# Patient Record
Sex: Male | Born: 1935 | Race: White | Hispanic: No | Marital: Married | Smoking: Never smoker
Health system: Southern US, Community
[De-identification: ages and names within clinical notes are randomized; demographics above are authoritative.]

## PROBLEM LIST (undated history)

## (undated) ENCOUNTER — Emergency Department (HOSPITAL_COMMUNITY): Admission: EM | Payer: Self-pay | Source: Home / Self Care

## (undated) ENCOUNTER — Inpatient Hospital Stay: Admission: EM | Payer: Self-pay | Source: Home / Self Care

## (undated) DIAGNOSIS — C61 Malignant neoplasm of prostate: Secondary | ICD-10-CM

## (undated) DIAGNOSIS — I10 Essential (primary) hypertension: Secondary | ICD-10-CM

## (undated) DIAGNOSIS — I2510 Atherosclerotic heart disease of native coronary artery without angina pectoris: Secondary | ICD-10-CM

## (undated) DIAGNOSIS — E785 Hyperlipidemia, unspecified: Secondary | ICD-10-CM

## (undated) DIAGNOSIS — Z973 Presence of spectacles and contact lenses: Secondary | ICD-10-CM

## (undated) DIAGNOSIS — C801 Malignant (primary) neoplasm, unspecified: Secondary | ICD-10-CM

## (undated) DIAGNOSIS — K219 Gastro-esophageal reflux disease without esophagitis: Secondary | ICD-10-CM

## (undated) DIAGNOSIS — I219 Acute myocardial infarction, unspecified: Secondary | ICD-10-CM

## (undated) DIAGNOSIS — M1990 Unspecified osteoarthritis, unspecified site: Secondary | ICD-10-CM

## (undated) HISTORY — DX: Hyperlipidemia, unspecified: E78.5

## (undated) HISTORY — PX: CORONARY ANGIOPLASTY: SHX604

## (undated) HISTORY — PX: OTHER SURGICAL HISTORY: SHX169

## (undated) HISTORY — DX: Essential (primary) hypertension: I10

## (undated) HISTORY — DX: Atherosclerotic heart disease of native coronary artery without angina pectoris: I25.10

## (undated) HISTORY — DX: Malignant neoplasm of prostate: C61

---

## 1998-04-29 ENCOUNTER — Other Ambulatory Visit: Admission: RE | Admit: 1998-04-29 | Discharge: 1998-04-29 | Payer: Self-pay | Admitting: Urology

## 1999-01-13 ENCOUNTER — Emergency Department (HOSPITAL_COMMUNITY): Admission: EM | Admit: 1999-01-13 | Discharge: 1999-01-13 | Payer: Self-pay | Admitting: Emergency Medicine

## 1999-01-13 ENCOUNTER — Encounter: Payer: Self-pay | Admitting: Emergency Medicine

## 2000-07-13 ENCOUNTER — Emergency Department (HOSPITAL_COMMUNITY): Admission: EM | Admit: 2000-07-13 | Discharge: 2000-07-13 | Payer: Self-pay | Admitting: Emergency Medicine

## 2001-02-28 ENCOUNTER — Ambulatory Visit (HOSPITAL_COMMUNITY): Admission: RE | Admit: 2001-02-28 | Discharge: 2001-02-28 | Payer: Self-pay | Admitting: Family Medicine

## 2001-02-28 ENCOUNTER — Encounter: Payer: Self-pay | Admitting: Family Medicine

## 2001-11-29 ENCOUNTER — Emergency Department (HOSPITAL_COMMUNITY): Admission: EM | Admit: 2001-11-29 | Discharge: 2001-11-29 | Payer: Self-pay | Admitting: Emergency Medicine

## 2001-11-29 ENCOUNTER — Encounter: Payer: Self-pay | Admitting: Emergency Medicine

## 2004-11-21 ENCOUNTER — Ambulatory Visit: Payer: Self-pay | Admitting: Internal Medicine

## 2004-11-21 ENCOUNTER — Emergency Department (HOSPITAL_COMMUNITY): Admission: EM | Admit: 2004-11-21 | Discharge: 2004-11-21 | Payer: Self-pay | Admitting: Emergency Medicine

## 2005-03-01 ENCOUNTER — Ambulatory Visit: Payer: Self-pay | Admitting: Family Medicine

## 2005-04-29 ENCOUNTER — Encounter: Admission: RE | Admit: 2005-04-29 | Discharge: 2005-04-29 | Payer: Self-pay | Admitting: Family Medicine

## 2005-04-29 ENCOUNTER — Ambulatory Visit: Payer: Self-pay | Admitting: Family Medicine

## 2007-01-25 ENCOUNTER — Ambulatory Visit: Payer: Self-pay | Admitting: Family Medicine

## 2007-04-18 ENCOUNTER — Ambulatory Visit: Payer: Self-pay | Admitting: Family Medicine

## 2007-04-18 LAB — CONVERTED CEMR LAB
ALT: 17 units/L (ref 0–40)
AST: 23 units/L (ref 0–37)
Albumin: 3.8 g/dL (ref 3.5–5.2)
Alkaline Phosphatase: 54 units/L (ref 39–117)
BUN: 17 mg/dL (ref 6–23)
Basophils Absolute: 0 10*3/uL (ref 0.0–0.1)
Basophils Relative: 0.1 % (ref 0.0–1.0)
Bilirubin, Direct: 0.1 mg/dL (ref 0.0–0.3)
CO2: 28 meq/L (ref 19–32)
Calcium: 9.1 mg/dL (ref 8.4–10.5)
Chloride: 105 meq/L (ref 96–112)
Cholesterol: 102 mg/dL (ref 0–200)
Creatinine, Ser: 0.9 mg/dL (ref 0.4–1.5)
Eosinophils Absolute: 0 10*3/uL (ref 0.0–0.6)
Eosinophils Relative: 1 % (ref 0.0–5.0)
GFR calc Af Amer: 106 mL/min
GFR calc non Af Amer: 87 mL/min
Glucose, Bld: 76 mg/dL (ref 70–99)
HCT: 40 % (ref 39.0–52.0)
HDL: 27 mg/dL — ABNORMAL LOW (ref >39.0)
Hemoglobin: 13.7 g/dL (ref 13.0–17.0)
LDL Cholesterol: 47 mg/dL (ref 0–99)
Lymphocytes Relative: 42.5 % (ref 12.0–46.0)
MCHC: 34.4 g/dL (ref 30.0–36.0)
MCV: 90.8 fL (ref 78.0–100.0)
Monocytes Absolute: 0.4 10*3/uL (ref 0.2–0.7)
Monocytes Relative: 10.1 % (ref 3.0–11.0)
Neutro Abs: 1.7 10*3/uL (ref 1.4–7.7)
Neutrophils Relative %: 46.3 % (ref 43.0–77.0)
PSA: 4.87 ng/mL — ABNORMAL HIGH (ref 0.10–4.00)
Platelets: 167 10*3/uL (ref 150–400)
Potassium: 3.8 meq/L (ref 3.5–5.1)
RBC: 4.4 M/uL (ref 4.22–5.81)
RDW: 12.9 % (ref 11.5–14.6)
Sodium: 141 meq/L (ref 135–145)
TSH: 0.79 microintl units/mL (ref 0.35–5.50)
Total Bilirubin: 1 mg/dL (ref 0.3–1.2)
Total CHOL/HDL Ratio: 3.8
Total Protein: 6.4 g/dL (ref 6.0–8.3)
Triglycerides: 138 mg/dL (ref 0–149)
VLDL: 28 mg/dL (ref 0–40)
WBC: 3.7 10*3/uL — ABNORMAL LOW (ref 4.5–10.5)

## 2007-05-02 ENCOUNTER — Ambulatory Visit: Payer: Self-pay | Admitting: Family Medicine

## 2007-07-14 ENCOUNTER — Telehealth: Payer: Self-pay | Admitting: Family Medicine

## 2007-09-04 DIAGNOSIS — Z8546 Personal history of malignant neoplasm of prostate: Secondary | ICD-10-CM | POA: Insufficient documentation

## 2007-09-04 DIAGNOSIS — I2510 Atherosclerotic heart disease of native coronary artery without angina pectoris: Secondary | ICD-10-CM | POA: Insufficient documentation

## 2007-09-04 DIAGNOSIS — I700 Atherosclerosis of aorta: Secondary | ICD-10-CM | POA: Insufficient documentation

## 2007-09-04 DIAGNOSIS — K219 Gastro-esophageal reflux disease without esophagitis: Secondary | ICD-10-CM

## 2008-05-23 ENCOUNTER — Ambulatory Visit: Payer: Self-pay | Admitting: Family Medicine

## 2008-05-23 DIAGNOSIS — I10 Essential (primary) hypertension: Secondary | ICD-10-CM

## 2008-05-23 DIAGNOSIS — E785 Hyperlipidemia, unspecified: Secondary | ICD-10-CM

## 2008-05-23 LAB — CONVERTED CEMR LAB
Bilirubin Urine: NEGATIVE
Blood in Urine, dipstick: NEGATIVE
Glucose, Urine, Semiquant: NEGATIVE
Nitrite: NEGATIVE
Protein, U semiquant: NEGATIVE
Specific Gravity, Urine: 1.025
Urobilinogen, UA: 0.2
WBC Urine, dipstick: NEGATIVE
pH: 6

## 2008-05-27 LAB — CONVERTED CEMR LAB
ALT: 23 units/L (ref 0–53)
AST: 32 units/L (ref 0–37)
Albumin: 4 g/dL (ref 3.5–5.2)
Alkaline Phosphatase: 72 units/L (ref 39–117)
BUN: 17 mg/dL (ref 6–23)
Basophils Absolute: 0 10*3/uL (ref 0.0–0.1)
Basophils Relative: 0.3 % (ref 0.0–3.0)
Bilirubin, Direct: 0.1 mg/dL (ref 0.0–0.3)
CO2: 31 meq/L (ref 19–32)
Calcium: 9.3 mg/dL (ref 8.4–10.5)
Chloride: 103 meq/L (ref 96–112)
Cholesterol: 135 mg/dL (ref 0–200)
Creatinine, Ser: 0.8 mg/dL (ref 0.4–1.5)
Eosinophils Absolute: 0.1 10*3/uL (ref 0.0–0.7)
Eosinophils Relative: 1.2 % (ref 0.0–5.0)
GFR calc Af Amer: 121 mL/min
GFR calc non Af Amer: 100 mL/min
Glucose, Bld: 101 mg/dL — ABNORMAL HIGH (ref 70–99)
HCT: 41.2 % (ref 39.0–52.0)
HDL: 34.8 mg/dL — ABNORMAL LOW (ref >39.0)
Hemoglobin: 14.1 g/dL (ref 13.0–17.0)
LDL Cholesterol: 72 mg/dL (ref 0–99)
Lymphocytes Relative: 42.7 % (ref 12.0–46.0)
MCHC: 34.3 g/dL (ref 30.0–36.0)
MCV: 92.3 fL (ref 78.0–100.0)
Monocytes Absolute: 0.5 10*3/uL (ref 0.1–1.0)
Monocytes Relative: 10.8 % (ref 3.0–12.0)
Neutro Abs: 1.9 10*3/uL (ref 1.4–7.7)
Neutrophils Relative %: 45 % (ref 43.0–77.0)
PSA: 0.16 ng/mL (ref 0.10–4.00)
Platelets: 191 10*3/uL (ref 150–400)
Potassium: 4.3 meq/L (ref 3.5–5.1)
RBC: 4.46 M/uL (ref 4.22–5.81)
RDW: 12.9 % (ref 11.5–14.6)
Sodium: 140 meq/L (ref 135–145)
TSH: 0.83 microintl units/mL (ref 0.35–5.50)
Total Bilirubin: 1 mg/dL (ref 0.3–1.2)
Total CHOL/HDL Ratio: 3.9
Total Protein: 6.5 g/dL (ref 6.0–8.3)
Triglycerides: 140 mg/dL (ref 0–149)
VLDL: 28 mg/dL (ref 0–40)
WBC: 4.3 10*3/uL — ABNORMAL LOW (ref 4.5–10.5)

## 2008-09-23 ENCOUNTER — Ambulatory Visit: Payer: Self-pay | Admitting: Family Medicine

## 2008-10-23 ENCOUNTER — Ambulatory Visit: Payer: Self-pay | Admitting: Internal Medicine

## 2008-10-23 DIAGNOSIS — R1013 Epigastric pain: Secondary | ICD-10-CM

## 2008-10-23 DIAGNOSIS — R1319 Other dysphagia: Secondary | ICD-10-CM

## 2008-10-23 DIAGNOSIS — R12 Heartburn: Secondary | ICD-10-CM

## 2009-04-28 ENCOUNTER — Ambulatory Visit: Payer: Self-pay | Admitting: Internal Medicine

## 2009-04-28 DIAGNOSIS — R3915 Urgency of urination: Secondary | ICD-10-CM

## 2009-04-28 LAB — CONVERTED CEMR LAB
Bilirubin Urine: NEGATIVE
Blood in Urine, dipstick: NEGATIVE
Glucose, Urine, Semiquant: NEGATIVE
Ketones, urine, test strip: NEGATIVE
Nitrite: NEGATIVE
Protein, U semiquant: 30
Specific Gravity, Urine: >=1.03
Urobilinogen, UA: 0.2
WBC Urine, dipstick: NEGATIVE
pH: 5

## 2010-01-19 ENCOUNTER — Ambulatory Visit: Payer: Self-pay | Admitting: Family Medicine

## 2010-01-19 LAB — CONVERTED CEMR LAB
ALT: 15 units/L (ref 0–53)
AST: 24 units/L (ref 0–37)
Albumin: 4 g/dL (ref 3.5–5.2)
Eosinophils Relative: 0.8 % (ref 0.0–5.0)
GFR calc non Af Amer: 76.83 mL/min (ref >60)
Glucose, Bld: 90 mg/dL (ref 70–99)
HCT: 40.1 % (ref 39.0–52.0)
HDL: 46.9 mg/dL (ref >39.00)
Hemoglobin: 13.5 g/dL (ref 13.0–17.0)
Lymphs Abs: 2 10*3/uL (ref 0.7–4.0)
Monocytes Relative: 9.8 % (ref 3.0–12.0)
Neutro Abs: 3.2 10*3/uL (ref 1.4–7.7)
Potassium: 4.8 meq/L (ref 3.5–5.1)
Sodium: 143 meq/L (ref 135–145)
TSH: 1.02 microintl units/mL (ref 0.35–5.50)
VLDL: 43.4 mg/dL — ABNORMAL HIGH (ref 0.0–40.0)
WBC: 5.8 10*3/uL (ref 4.5–10.5)

## 2010-03-09 ENCOUNTER — Ambulatory Visit: Payer: Self-pay | Admitting: Family Medicine

## 2010-12-08 NOTE — Assessment & Plan Note (Signed)
Summary: MED CK / REFILL // RS   Vital Signs:  Patient profile:   77 year old male Weight:      159 pounds Temp:     99.2 degrees F oral BP sitting:   110 / 70  (left arm) Cuff size:   regular  Vitals Entered By: Kern Reap CMA Duncan Dull) (January 19, 2010 3:26 PM)  Reason for Visit follow up meds    Primary Care Provider:  Kelle Darting, MD   History of Present Illness:  Randy [name] is a 77 year old, married male, nonsmoker, who comes in today for evaluation of multiple issues.  He has underlying hyperlipidemia, for which he takes Niaspan 500 mg daily and simvastatin 20 mg daily.  Will check lipid panel.  He has underlying coronary disease, asymptomatic.  No chest pain.  His hypertension, for which he takes a beta-blocker, 50 mg dose one half tablet b.i.d., BP 110/70.   12 months since he said a checkup.  His also on Toviaz from his urologist, Dr. Brunilda Payor......... however he doesn't feel, like it's working.  He would like a second opinion.  Encouraged him to discuss this with Dr. Brunilda Payor   Allergies (verified): No Known Drug Allergies  Past History:  Past medical, surgical, family and social histories (including risk factors) reviewed for relevance to current acute and chronic problems.  Past Medical History: Reviewed history from 05/23/2008 and no changes required. Prostate cancer, hx of GERD Myocardial infarction, hx of Hyperlipidemia Hypertension  Past Surgical History: Reviewed history from 10/23/2008 and no changes required. Flexible Sigmoidoscopy Prostatectomy PTCA-Stent  Family History: Reviewed history from 10/23/2008 and no changes required. Family History Diabetes 1st degree relative (mother) Family History of Prostate CA 1st degree relative <50 Family History of Cardiovascular disorder Bone cancer: Father  Social History: Reviewed history from 10/23/2008 and no changes required. 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 Has sons. Never  Smoked Alcohol use-no Drug use-no  Review of Systems      See HPI  Physical Exam  General:  Well-developed,well-nourished,in no acute distress; alert,appropriate and cooperative throughout examination   Impression & Recommendations:  Problem # 1:  HYPERTENSION (ICD-401.9) Assessment Improved  His updated medication list for this problem includes:    Metoprolol Tartrate 50 Mg Tabs (Metoprolol tartrate) .Marland Kitchen... 1/2 tablet by mouth two times a day  Orders: Venipuncture (16109) TLB-Lipid Panel (80061-LIPID) TLB-BMP (Basic Metabolic Panel-BMET) (80048-METABOL) TLB-CBC Platelet - w/Differential (85025-CBCD) TLB-Hepatic/Liver Function Pnl (80076-HEPATIC) TLB-TSH (Thyroid Stimulating Hormone) (60454-UJW) Prescription Created Electronically [phone])  Problem # 2:  HYPERLIPIDEMIA (ICD-272.4) Assessment: Improved  His updated medication list for this problem includes:    Niaspan 500 Mg Tbcr (Niacin (antihyperlipidemic)) .Marland Kitchen... Take 1 tablet by mouth at bedtime    Simvastatin 20 Mg Tabs (Simvastatin) .Marland Kitchen... Take on at bedtime  Orders: Venipuncture (78295) TLB-Lipid Panel (80061-LIPID) TLB-BMP (Basic Metabolic Panel-BMET) (80048-METABOL) TLB-CBC Platelet - w/Differential (85025-CBCD) TLB-Hepatic/Liver Function Pnl (80076-HEPATIC) TLB-TSH (Thyroid Stimulating Hormone) (62130-QMV) Prescription Created Electronically [id number])  Problem # 3:  MYOCARDIAL INFARCTION, HX OF (ICD-412) Assessment: Unchanged  His updated medication list for this problem includes:    Metoprolol Tartrate 50 Mg Tabs (Metoprolol tartrate) .Marland Kitchen... 1/2 tablet by mouth two times a day    Nitroglycerin 0.4 Mg Subl (Nitroglycerin) .Marland Kitchen... Place 1 tablet under tongue as directed    Bayer Aspirin 325 Mg Tabs (Aspirin)  Orders: Venipuncture (62952) TLB-Lipid Panel (80061-LIPID) TLB-BMP (Basic Metabolic Panel-BMET) (80048-METABOL) TLB-CBC Platelet - w/Differential (85025-CBCD) TLB-Hepatic/Liver Function Pnl  (80076-HEPATIC) TLB-TSH (Thyroid Stimulating  Hormone) (84443-TSH)  Complete Medication List: 1)  Metoprolol Tartrate 50 Mg Tabs (Metoprolol tartrate) .... 1/2 tablet by mouth two times a day 2)  Niaspan 500 Mg Tbcr (Niacin (antihyperlipidemic)) .... Take 1 tablet by mouth at bedtime 3)  Nitroglycerin 0.4 Mg Subl (Nitroglycerin) .... Place 1 tablet under tongue as directed 4)  Simvastatin 20 Mg Tabs (Simvastatin) .... Take on at bedtime 5)  Bayer Aspirin 325 Mg Tabs (Aspirin) 6)  Fish Oil Oil (Fish oil) .Marland Kitchen.. 1 capsule by mouth two times a day 7)  Calcium-vitamin D 500-125 Mg-unit Tabs (Calcium-vitamin d) .... Once daily  Patient Instructions: 1)  continue your medications. 2)  We will get u   Labwork today. 3)  So, the 30 minute appointment sometime in the next 4 to 6 weeks Prescriptions: SIMVASTATIN 20 MG TABS (SIMVASTATIN) take on at bedtime  #100 x 3   Entered and Authorized by:   Roderick Pee MD   Signed by:   Roderick Pee MD on 01/19/2010   Method used:   [facility] [phone]* (retail)       [address]       [place], [place]  [address]       Ph: [phone] or [phone]       Fax: [phone]   RxID:   [id number] NITROGLYCERIN 0.4 MG SUBL (NITROGLYCERIN) Place 1 tablet under tongue as directed  #25 x 1   Entered and Authorized by:   Roderick Pee MD   Signed by:   Roderick Pee MD on 01/19/2010   Method used:   [facility]        [facility] [phone]* (retail)       [address]       [place], [place]  [address]       Ph: [phone] or [phone]       Fax: [phone]   RxID:   [id number] NIASPAN 500 MG TBCR (NIACIN (ANTIHYPERLIPIDEMIC)) Take 1 tablet by mouth at bedtime  #100 x 3   Entered and Authorized by:   Roderick Pee MD   Signed by:   Roderick Pee MD on 01/19/2010   Method used:   [facility]        [facility]  [phone]* (retail)       [address]       [place], [place]  [id number]       Ph: [phone] or [phone]       Fax: [phone]   RxID:   [id number] METOPROLOL TARTRATE 50 MG TABS (METOPROLOL TARTRATE) 1/2 tablet by mouth two times a day  #100 x 3   Entered and Authorized by:   Roderick Pee MD   Signed by:   Roderick Pee MD on 01/19/2010   Method used:   [facility]        [facility] [phone]* (retail)       [address]       [place], [place]  [address]       Ph: [phone] or [phone]       Fax: [phone]   RxID:   [id number]

## 2010-12-08 NOTE — Assessment & Plan Note (Signed)
Summary: emp/cjr/pt rsc/cjr   Vital Signs:  Patient profile:   77 year old male Height:      67.25 inches Weight:      154 pounds BMI:     24.03 Temp:     98.1 degrees F oral BP sitting:   102 / 58  (left arm) Cuff size:   regular  Vitals Entered By: Kern Reap CMA Duncan Dull) (Mar 09, 2010 2:50 PM) CC: cpx   Primary Care Provider:  Kelle Darting, MD  CC:  cpx.  History of Present Illness: Randy [name] is a 77 year old male, who comes in today for general physical examination because of a history of underlying coronary disease, hyperlipidemia.  He takes Niaspan 500 mg daily, one aspirin daily, a beta-blocker one half tablet 50 mg b.i.d..  Coronary-wise, he is stable.  BP 102/58.  He takes simvastatin 20 mg nightly for hyperlipidemia.  Lipids are at goal with an LDL of 96.  He gets routine eye care.  Dental care.  Colonoscopy normal in GI, tetanus, 2004, Pneumovax 2000, declined, seasonal flu shot.  He taking toviaz from his urologist for urinary control.  He said a history of prostate cancer and has urinary leakage.  His past medical issues.  Social history, family history view in detail.  Physically, he continues to remain active.  Daily.  Mood is good, hearing normal, ADLs, normal, fall risk reviewed home safety reviewed.  Height, weight, vision, normal.  Allergies: No Known Drug Allergies  Past History:  Past medical, surgical, family and social histories (including risk factors) reviewed, and no changes noted (except as noted below).  Past Medical History: Reviewed history from 05/23/2008 and no changes required. Prostate cancer, hx of GERD Myocardial infarction, hx of Hyperlipidemia Hypertension  Past Surgical History: Reviewed history from 10/23/2008 and no changes required. Flexible Sigmoidoscopy Prostatectomy PTCA-Stent  Family History: Reviewed history from 10/23/2008 and no changes required. Family History Diabetes 1st degree relative (mother) Family History  of Prostate CA 1st degree relative <50 Family History of Cardiovascular disorder Bone cancer: Father  Social History: Reviewed history from 10/23/2008 and no changes required. 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 Has sons. Never Smoked Alcohol use-no Drug use-no  Review of Systems      See HPI  Physical Exam  General:  Well-developed,well-nourished,in no acute distress; alert,appropriate and cooperative throughout examination Head:  Normocephalic and atraumatic without obvious abnormalities. No apparent alopecia or balding. Eyes:  No corneal or conjunctival inflammation noted. EOMI. Perrla. Funduscopic exam benign, without hemorrhages, exudates or papilledema. Vision grossly normal. Ears:  External ear exam shows no significant lesions or deformities.  Otoscopic examination reveals clear canals, tympanic membranes are intact bilaterally without bulging, retraction, inflammation or discharge. Hearing is grossly normal bilaterally. Nose:  External nasal examination shows no deformity or inflammation. Nasal mucosa are pink and moist without lesions or exudates. Mouth:  Oral mucosa and oropharynx without lesions or exudates.  Teeth in good repair. Neck:  No deformities, masses, or tenderness noted. Chest Wall:  No deformities, masses, tenderness or gynecomastia noted. Breasts:  No masses or gynecomastia noted Lungs:  Normal respiratory effort, chest expands symmetrically. Lungs are clear to auscultation, no crackles or wheezes. Heart:  Normal rate and regular rhythm. S1 and S2 normal without gallop, murmur, click, rub or other extra sounds. Abdomen:  Bowel sounds positive,abdomen soft and non-tender without masses, organomegaly or hernias noted. Msk:  No deformity or scoliosis noted of thoracic or lumbar spine.   Pulses:  R and L carotid,radial,femoral,dorsalis pedis and posterior tibial pulses are full and equal bilaterally Extremities:  No clubbing, cyanosis, edema, or  deformity noted with normal full range of motion of all joints.   Neurologic:  No cranial nerve deficits noted. Station and gait are normal. Plantar reflexes are down-going bilaterally. DTRs are symmetrical throughout. Sensory, motor and coordinative functions appear intact. Skin:  Intact without suspicious lesions or rashes Cervical Nodes:  No lymphadenopathy noted Axillary Nodes:  No palpable lymphadenopathy Inguinal Nodes:  No significant adenopathy Psych:  Cognition and judgment appear intact. Alert and cooperative with normal attention span and concentration. No apparent delusions, illusions, hallucinations   Impression & Recommendations:  Problem # 1:  HYPERTENSION (ICD-401.9) Assessment Improved  His updated medication list for this problem includes:    Metoprolol Tartrate 50 Mg Tabs (Metoprolol tartrate) .Marland Kitchen... 1/2 tablet by mouth two times a day  Orders: Prescription Created Electronically [id number]) EKG w/ Interpretation (93000)  Problem # 2:  HYPERLIPIDEMIA (ICD-272.4) Assessment: Improved  His updated medication list for this problem includes:    Niaspan 500 Mg Tbcr (Niacin (antihyperlipidemic)) .Marland Kitchen... Take 1 tablet by mouth at bedtime    Simvastatin 20 Mg Tabs (Simvastatin) .Marland Kitchen... Take on at bedtime  Orders: Prescription Created Electronically [phone]) EKG w/ Interpretation (93000)  Problem # 3:  PREVENTIVE HEALTH CARE (ICD-V70.0) Assessment: Unchanged  Orders: Prescription Created Electronically [id number]) EKG w/ Interpretation (93000)  Complete Medication List: 1)  Metoprolol Tartrate 50 Mg Tabs (Metoprolol tartrate) .... 1/2 tablet by mouth two times a day 2)  Niaspan 500 Mg Tbcr (Niacin (antihyperlipidemic)) .... Take 1 tablet by mouth at bedtime 3)  Nitroglycerin 0.4 Mg Subl (Nitroglycerin) .... Place 1 tablet under tongue as directed 4)  Simvastatin 20 Mg Tabs (Simvastatin) .... Take on at bedtime 5)  Bayer Aspirin 325 Mg Tabs (Aspirin) 6)  Fish Oil Oil (Fish  oil) .Marland Kitchen.. 1 capsule by mouth two times a day 7)  Calcium-vitamin D 500-125 Mg-unit Tabs (Calcium-vitamin d) .... Once daily  Patient Instructions: 1)   continue current medications.  Follow-up in one year.  Remember to get the flu shot.  Next follow Prescriptions: SIMVASTATIN 20 MG TABS (SIMVASTATIN) take on at bedtime  #100 x 3   Entered and Authorized by:   Roderick Pee MD   Signed by:   Roderick Pee MD on 03/09/2010   Method used:   [facility]        [facility] [address] [phone]* (retail)       [address]       [place], [place]  [address]       Ph: [phone] or [phone]       Fax: [phone]   RxID:   [id number] NITROGLYCERIN 0.4 MG SUBL (NITROGLYCERIN) Place 1 tablet under tongue as directed  #25 x 1   Entered and Authorized by:   Roderick Pee MD   Signed by:   Roderick Pee MD on 03/09/2010   Method used:   [facility]ron[facility] to        [facility] [address] [phone]* (retail)       [address]       [place], [place]  [address]       Ph: [phone] or [phone]       Fax: [phone]   RxID:   [id number] NIASPAN 500 MG TBCR (NIACIN (ANTIHYPERLIPIDEMIC)) Take  1 tablet by mouth at bedtime  #100 x 3   Entered and Authorized by:   Roderick Pee MD   Signed by:   Roderick Pee MD on 03/09/2010   Method used:   Electronically to        [facility] [address] [phone]* (retail)       [address]       [place], [place]  [address]       Ph: [phone] or [phone]       Fax: [phone]   RxID:   [id number] METOPROLOL TARTRATE 50 MG TABS (METOPROLOL TARTRATE) 1/2 tablet by mouth two times a day  #100 Tablet x 3   Entered and Authorized by:   Roderick Pee MD   Signed by:   Roderick Pee MD on 03/09/2010   Method used:   [facility]ronically to        [facility] [address] [phone]* (retail)       [address]       [place], [place]  [address]       Ph: [phone] or [phone]       Fax: [phone]   RxID:   [id number]

## 2011-01-22 ENCOUNTER — Other Ambulatory Visit: Payer: Self-pay | Admitting: Family Medicine

## 2011-02-18 ENCOUNTER — Other Ambulatory Visit: Payer: Self-pay | Admitting: Family Medicine

## 2011-03-26 NOTE — Consult Note (Signed)
[name], [name] NO.:  [id number]   MEDICAL RECORD NO.:  [id number]          PATIENT TYPE:  EMS   LOCATION:  [facility]                         FACILITY:  [facility]   PHYSICIAN:  Rosalyn Gess. Norins, M.D. LHCDATE OF BIRTH:  09-Jun-1930   DATE OF CONSULTATION:  11/21/2004  DATE OF DISCHARGE:                                   CONSULTATION   TIME SEEN:  Consult performed at 0830 hours.   CHIEF COMPLAINT:  Cough with chest wall pain.   HISTORY OF PRESENT ILLNESS:  Randy [name], a 77 year old married white male  followed by Dr. Tawanna Cooler for primary care and Dr. Garnette Scheuermann for cardiology,  reports he had an episode of cough and malaise around Christmas but  recovered.  The patient reports starting on November 18, 2004 he developed a  nonproductive cough, no shortness of breath, no sore throat but a low-grade  fever.  His symptoms progressed with increased coughing, still  nonproductive.  He developed chest wall pain and discomfort.  He began to  run fevers of 101.  He started on Tylenol.  He continued to have fevers and  cough and therefore presented to the emergency department for evaluation.  Initial emergency department evaluation revealed the patient to have a  negative chest x-ray.  He did spike a fever to 102, did have an ongoing  cough in the emergency department and therefore I was asked to see and  evaluate the patient.   PAST MEDICAL HISTORY:  Surgical:  Back surgery in the 1960s, prostatectomy  in the 1990s.  Medical illnesses were childhood diseases, history of  hypertension and hyperlipidemia, coronary artery disease with an MI about 12  years ago status post PTCA, history of prostate cancer.   MEDICATIONS:  1.  Pravachol 20 mg a day.  2.  Metoprolol 25 mg b.i.d.  3.  Aspirin.  4.  Fish oil.   HABITS:  Tobacco:  None.  Alcohol:  None.   The patient has no known drug allergies.   FAMILY HISTORY:  Noncontributory.   SOCIAL HISTORY:  The patient spent 4 years  in the [employer] and served in  [place].  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 and worked for [employer]  where he continues to work.  He has been married 44 happy years.  He had 2  sons, 3 grandchildren.  He and his wife live together and they are  independent in their activities of daily living and as noted the patient  continues to work.   REVIEW OF SYSTEMS:  The patient has had no general constitutional symptoms  except the current illness.  He has had no cardiovascular complaints, no  respiratory complaints except for the current illness.  No rheumatologic  complaints.   PHYSICAL EXAMINATION:  VITAL SIGNS:  T-max was 102.3, temperature at my exam  was 101, blood pressure 111/57, heart rate was 94, respirations 16.  GENERAL APPEARANCE:  A well-nourished, well-developed Caucasian male.  He is  in no acute distress.  HEENT:  Normocephalic, atraumatic.  __________ with cerumen.  Oropharynx  with negative dentition, no buccal or palatal lesions were noted.  Posterior  pharynx was clear.  Conjunctivae and sclerae were clear.  Pupils equal,  round, reactive.  NECK:  Supple without thyromegaly.  LYMPHATICS:  No adenopathy was noted in the cervical or supraclavicular  regions.  CHEST:  No CVA tenderness.  Lungs were clear with no rales, no wheezes, no  increased work of breathing.  Chest wall was tender to palpation.  CARDIOVASCULAR:  2+ peripheral pulses, no JVD or carotid bruits.  He had a  quiet precordium with a regular rate and rhythm.  ABDOMEN:  Soft, no guarding or rebound.  No organosplenomegaly was noted.  GENITALIA:  Deferred.  EXTREMITIES:  Without clubbing, cyanosis, edema or deformity.  NEUROLOGICAL:  Nonfocal.   LABORATORY DATA:  Hemoglobin 12.7 g, white count 6000 with 86% segs, 6%  lymphs, 8% monos.  UA was negative.  Chemistries were unremarkable with a  creatinine of 1, SGOT minimally  elevated at 47.  Chest x-ray revealed normal  cardiac silhouette, normal bony architecture, no infiltrates or effusions.   ASSESSMENT:  Atypical bronchitis the patient with fever cough that is  nonproductive, weakness.  Suspect he has Mycoplasmal pneumoniae versus  Legionella versus Haemophilus influenzae.  The patient does not appear toxic  or unstable and therefore I believe can be discharged from the emergency  department for outpatient treatment.   PLAN:  1.  Discharge home with Biaxin XL 500 mg 2 tablets daily for 7 days,      Phenergan with codeine cough syrup.  2.  The patient is to continue all of his other medications.  3.  The patient is instructed to see Dr. Alonza Smoker, his primary physician,      late next week for follow up.  He is to see Dr. Katrinka Blazing as needed.       MEN/MEDQ  D:  11/21/2004  T:  11/21/2004  Job:  [id number]   cc:   Tinnie Gens A. Tawanna Cooler, M.D. [facility]   Lyn Records III, M.D.  [address]  [address]  [place]  [place] [address]  Fax: [phone]

## 2011-04-18 ENCOUNTER — Other Ambulatory Visit: Payer: Self-pay | Admitting: Family Medicine

## 2011-05-08 ENCOUNTER — Other Ambulatory Visit: Payer: Self-pay | Admitting: Family Medicine

## 2011-06-01 ENCOUNTER — Other Ambulatory Visit: Payer: Self-pay | Admitting: Family Medicine

## 2011-06-08 ENCOUNTER — Ambulatory Visit (HOSPITAL_BASED_OUTPATIENT_CLINIC_OR_DEPARTMENT_OTHER)
Admission: RE | Admit: 2011-06-08 | Discharge: 2011-06-08 | Disposition: A | Discharge status: Home / Self Care | Payer: Medicare Other | Source: Ambulatory Visit | Attending: Urology | Admitting: Urology

## 2011-06-08 DIAGNOSIS — N210 Calculus in bladder: Secondary | ICD-10-CM | POA: Insufficient documentation

## 2011-06-08 DIAGNOSIS — Z8546 Personal history of malignant neoplasm of prostate: Secondary | ICD-10-CM | POA: Insufficient documentation

## 2011-06-08 DIAGNOSIS — Z01812 Encounter for preprocedural laboratory examination: Secondary | ICD-10-CM | POA: Insufficient documentation

## 2011-06-08 DIAGNOSIS — N189 Chronic kidney disease, unspecified: Secondary | ICD-10-CM | POA: Insufficient documentation

## 2011-06-08 DIAGNOSIS — Z01810 Encounter for preprocedural cardiovascular examination: Secondary | ICD-10-CM | POA: Insufficient documentation

## 2011-06-08 DIAGNOSIS — K219 Gastro-esophageal reflux disease without esophagitis: Secondary | ICD-10-CM | POA: Insufficient documentation

## 2011-06-25 NOTE — Op Note (Signed)
  [name], [name] NO.:  [id number]  MEDICAL RECORD NO.:  [id number]  LOCATION:                                 FACILITY:  PHYSICIAN:  Danae Chen, M.D.  DATE OF BIRTH:  Oct 14, 1930  DATE OF PROCEDURE:  06/08/2011 DATE OF DISCHARGE:                              OPERATIVE REPORT   PREOPERATIVE DIAGNOSES: 1. Bladder calculus. 2. History of prostate cancer, status post radical prostatectomy in     1991.  POSTOPERATIVE DIAGNOSES: 1. Bladder calculus. 2. History of prostate cancer, status post radical prostatectomy in     1991.  PROCEDURE:  Cystoscopy, holmium laser of bladder stone.  SURGEON:  Danae Chen, M.D.  ANESTHESIA:  General.  INDICATIONS:  The patient is a 77 year old male who has been complaining of frequency, hesitancy, dysuria.  Cystoscopy showed a calculus in the bladder.  He is scheduled today for cystoscopy, holmium laser of bladder calculus.  DESCRIPTION OF PROCEDURE:  The patient was identified by his wristband and proper time-out was taken.  Under general anesthesia, he was prepped and draped and placed in the dorsal lithotomy position.  A panendoscope was inserted in the urethra, but could not be passed in the bladder because of bladder neck contracture.  The anterior urethra was normal.  The cystoscope was removed.  The urethra was then sequentially dilated up to #24 [place] and then the cystoscope was inserted in the bladder.  There was a stone in the bladder that measures about 5 cm.  There was no evidence of tumor in the bladder.  The ureteral orifices were in normal position and shape. With the holmium laser at a setting of 0.5 and 20 shocks, the stone was fragmented.  The stone fragments were removed with a nitinol basket. Several smaller stone fragments were then irrigated out of the bladder with the Toomey syringe. There was no evidence of remaining stone fragments in the bladder.  The cystoscope was then removed.  A #16  French Foley catheter was then inserted in the bladder.  The patient tolerated the procedure well and left the OR in satisfactory condition to postanesthesia care unit.     Danae Chen, M.D.     MN/MEDQ  D:  06/08/2011  T:  06/09/2011  Job:  [id number]  Electronically Signed by Lindaann Slough M.D. on 06/25/2011 07:41:02 AM

## 2011-08-04 ENCOUNTER — Other Ambulatory Visit: Payer: Self-pay | Admitting: Family Medicine

## 2011-09-01 ENCOUNTER — Other Ambulatory Visit: Payer: Self-pay | Admitting: Family Medicine

## 2011-12-19 ENCOUNTER — Other Ambulatory Visit: Payer: Self-pay | Admitting: Family Medicine

## 2012-02-17 ENCOUNTER — Other Ambulatory Visit: Payer: Self-pay | Admitting: Family Medicine

## 2012-04-06 ENCOUNTER — Other Ambulatory Visit: Payer: Self-pay | Admitting: Family Medicine

## 2012-05-10 ENCOUNTER — Other Ambulatory Visit: Payer: Self-pay | Admitting: *Deleted

## 2012-05-10 MED ORDER — METOPROLOL TARTRATE 50 MG PO TABS: ORAL_TABLET | ORAL | Status: DC

## 2012-07-20 ENCOUNTER — Other Ambulatory Visit: Payer: Self-pay | Admitting: Family Medicine

## 2012-08-07 ENCOUNTER — Encounter: Payer: Self-pay | Admitting: Family Medicine

## 2012-08-07 ENCOUNTER — Ambulatory Visit (INDEPENDENT_AMBULATORY_CARE_PROVIDER_SITE_OTHER): Payer: Medicare Other | Admitting: Family Medicine

## 2012-08-07 VITALS — BP 110/70 | Temp 97.5°F | Ht 68.5 in | Wt 160.0 lb

## 2012-08-07 DIAGNOSIS — Z8546 Personal history of malignant neoplasm of prostate: Secondary | ICD-10-CM

## 2012-08-07 DIAGNOSIS — E785 Hyperlipidemia, unspecified: Secondary | ICD-10-CM

## 2012-08-07 DIAGNOSIS — I10 Essential (primary) hypertension: Secondary | ICD-10-CM

## 2012-08-07 DIAGNOSIS — Z23 Encounter for immunization: Secondary | ICD-10-CM

## 2012-08-07 DIAGNOSIS — I252 Old myocardial infarction: Secondary | ICD-10-CM

## 2012-08-07 DIAGNOSIS — G4700 Insomnia, unspecified: Secondary | ICD-10-CM

## 2012-08-07 LAB — BASIC METABOLIC PANEL
CO2: 26 mEq/L (ref 19–32)
Calcium: 9.6 mg/dL (ref 8.4–10.5)
Chloride: 104 mEq/L (ref 96–112)
Creatinine, Ser: 1 mg/dL (ref 0.4–1.5)
Sodium: 140 mEq/L (ref 135–145)

## 2012-08-07 LAB — CBC WITH DIFFERENTIAL/PLATELET
Basophils Absolute: 0 10*3/uL (ref 0.0–0.1)
Eosinophils Relative: 0.8 % (ref 0.0–5.0)
Monocytes Relative: 9.5 % (ref 3.0–12.0)
Neutrophils Relative %: 52.9 % (ref 43.0–77.0)
Platelets: 184 10*3/uL (ref 150.0–400.0)
RDW: 14.2 % (ref 11.5–14.6)
WBC: 6.3 10*3/uL (ref 4.5–10.5)

## 2012-08-07 LAB — HEPATIC FUNCTION PANEL
ALT: 19 U/L (ref 0–53)
AST: 23 U/L (ref 0–37)
Albumin: 4.1 g/dL (ref 3.5–5.2)
Total Bilirubin: 0.6 mg/dL (ref 0.3–1.2)
Total Protein: 7 g/dL (ref 6.0–8.3)

## 2012-08-07 LAB — LIPID PANEL
Cholesterol: 209 mg/dL — ABNORMAL HIGH (ref 0–200)
HDL: 42.7 mg/dL (ref >39.00)
Triglycerides: 283 mg/dL — ABNORMAL HIGH (ref 0.0–149.0)

## 2012-08-07 MED ORDER — SIMVASTATIN 20 MG PO TABS: 40.0000 mg | ORAL_TABLET | Freq: Every day | ORAL | Status: DC

## 2012-08-07 MED ORDER — AMITRIPTYLINE HCL 25 MG PO TABS: ORAL_TABLET | ORAL | Status: DC

## 2012-08-07 MED ORDER — NIACIN ER (ANTIHYPERLIPIDEMIC) 1000 MG PO TBCR: 1000.0000 mg | EXTENDED_RELEASE_TABLET | Freq: Every day | ORAL | Status: DC

## 2012-08-07 MED ORDER — METOPROLOL TARTRATE 50 MG PO TABS: ORAL_TABLET | ORAL | Status: DC

## 2012-08-07 MED ORDER — SIMVASTATIN 20 MG PO TABS: 20.0000 mg | ORAL_TABLET | Freq: Every day | ORAL | Status: DC

## 2012-08-07 NOTE — Patient Instructions (Signed)
Continue your current medications  Begin Elavil 25 mg at 10 PM nightly  Labs today  Followup in 4 weeks

## 2012-08-07 NOTE — Progress Notes (Signed)
  Subjective:    Patient ID: Randy [name], male    DOB: 1930/03/15, 78 y.o.   MRN: [id number]  HPI Randy [name] is a 78 year old male who comes in after 4-5 year absence for followup and reevaluation  He's been seeing his cardiologist Dr. Katrinka [name] on a yearly basis because he said a heart attack. He's on Lopressor 25 mg daily BP 110/70 pulse 70 and regular simvastatin 20 mg daily and nitroglycerin when necessary  He states he saw Dr. Katrinka [name] a couple weeks ago and everything was normal.  He's not had a general medical exam in many years. He's also a history of prostate cancer and has been seen by urology  Review of systems negative except for sleep dysfunction. He says he goes to bed Kangas sleep he is worried but he can't throughout what he is worried about general and psychiatric review of systems otherwise negative    Review of Systems    general and psychiatric review of systems otherwise negative Objective:   Physical Exam Well-developed male no acute distress       Assessment & Plan:  Hypertension at goal continue current therapy  Hyperlipidemia goal continue current therapy  Coronary disease asymptomatic followed by Dr. Katrinka [name]  Sleep dysfunction

## 2012-08-08 LAB — PSA: PSA: 5.75 ng/mL — ABNORMAL HIGH (ref 0.10–4.00)

## 2012-08-08 LAB — TSH: TSH: 1.27 u[IU]/mL (ref 0.35–5.50)

## 2012-08-08 LAB — LDL CHOLESTEROL, DIRECT: Direct LDL: 116.9 mg/dL

## 2012-08-14 ENCOUNTER — Telehealth: Payer: Self-pay | Admitting: Family Medicine

## 2012-08-14 NOTE — Telephone Encounter (Signed)
Patient's prior auth for Amitriptyline has been denied. FDA does not cover for usage of treatment of insomnia. Please advise. Thank you.

## 2012-08-14 NOTE — Telephone Encounter (Signed)
100 tablets cause about $5

## 2012-08-15 NOTE — Telephone Encounter (Signed)
Called patient and informed him per Dr Tawanna Cooler amitriptyline 100 pills can cost $5.00

## 2012-09-19 ENCOUNTER — Other Ambulatory Visit (HOSPITAL_COMMUNITY): Payer: Self-pay | Admitting: Urology

## 2012-09-19 DIAGNOSIS — C61 Malignant neoplasm of prostate: Secondary | ICD-10-CM

## 2012-09-29 ENCOUNTER — Encounter (HOSPITAL_COMMUNITY): Payer: Medicare Other

## 2012-09-29 ENCOUNTER — Ambulatory Visit (HOSPITAL_COMMUNITY): Payer: Medicare Other

## 2012-10-03 ENCOUNTER — Encounter (HOSPITAL_COMMUNITY)
Admission: RE | Admit: 2012-10-03 | Discharge: 2012-10-03 | Disposition: A | Discharge status: Home / Self Care | Payer: Medicare Other | Source: Ambulatory Visit | Attending: Urology | Admitting: Urology

## 2012-10-03 DIAGNOSIS — C61 Malignant neoplasm of prostate: Secondary | ICD-10-CM

## 2012-10-03 MED ORDER — TECHNETIUM TC 99M MEDRONATE IV KIT
25.0000 | PACK | Freq: Once | INTRAVENOUS | Status: AC | PRN
  Administered 2012-10-03: 25 via INTRAVENOUS

## 2013-02-06 ENCOUNTER — Ambulatory Visit (INDEPENDENT_AMBULATORY_CARE_PROVIDER_SITE_OTHER): Payer: Medicare Other | Admitting: Family Medicine

## 2013-02-06 ENCOUNTER — Encounter: Payer: Self-pay | Admitting: Family Medicine

## 2013-02-06 VITALS — BP 140/80 | HR 65 | Temp 98.0°F

## 2013-02-06 DIAGNOSIS — G4700 Insomnia, unspecified: Secondary | ICD-10-CM

## 2013-02-06 DIAGNOSIS — I10 Essential (primary) hypertension: Secondary | ICD-10-CM

## 2013-02-06 MED ORDER — METOPROLOL TARTRATE 50 MG PO TABS: 50.0000 mg | ORAL_TABLET | Freq: Two times a day (BID) | ORAL | Status: DC

## 2013-02-06 MED ORDER — ZOLPIDEM TARTRATE 10 MG PO TABS: 10.0000 mg | ORAL_TABLET | Freq: Every evening | ORAL | Status: DC | PRN

## 2013-02-06 NOTE — Progress Notes (Signed)
  Subjective:    Patient ID: Randy [name], male    DOB: October 03, 1930, 79 y.o.   MRN: [id number]  HPI Here for high BP and insomnia. His BP has been running 140-180 over 70-90 for the past month. No HA or SOB or chest pain. He last saw Dr. Katrinka Blazing about 6 months ago. Also he has trouble sleeping every night, and he has tried some of his wife's Ambien. This works well and he wants some of his own.    Review of Systems  Constitutional: Negative.   Respiratory: Negative.   Cardiovascular: Negative.        Objective:   Physical Exam  Constitutional: He appears well-developed and well-nourished.  Neck: No thyromegaly present.  Cardiovascular: Normal rate, regular rhythm, normal heart sounds and intact distal pulses.   Pulmonary/Chest: Effort normal and breath sounds normal.  Lymphadenopathy:    He has no cervical adenopathy.          Assessment & Plan:  Increase the Metoprolol to a whole 50 mg tablet bid. Try Zolpidem. Follow up with Dr. Tawanna Cooler in 2-3 weeks

## 2013-02-14 ENCOUNTER — Other Ambulatory Visit: Payer: Self-pay | Admitting: Family Medicine

## 2013-02-19 ENCOUNTER — Ambulatory Visit: Payer: Medicare Other | Admitting: Family Medicine

## 2013-02-26 ENCOUNTER — Ambulatory Visit (INDEPENDENT_AMBULATORY_CARE_PROVIDER_SITE_OTHER): Payer: Medicare Other | Admitting: Family Medicine

## 2013-02-26 ENCOUNTER — Encounter: Payer: Self-pay | Admitting: Family Medicine

## 2013-02-26 VITALS — BP 128/70 | Temp 98.2°F | Wt 165.0 lb

## 2013-02-26 DIAGNOSIS — I10 Essential (primary) hypertension: Secondary | ICD-10-CM

## 2013-02-26 DIAGNOSIS — E785 Hyperlipidemia, unspecified: Secondary | ICD-10-CM

## 2013-02-26 MED ORDER — METOPROLOL TARTRATE 50 MG PO TABS: ORAL_TABLET | ORAL | Status: DC

## 2013-02-26 MED ORDER — SIMVASTATIN 20 MG PO TABS: 20.0000 mg | ORAL_TABLET | Freq: Every day | ORAL | Status: DC

## 2013-02-26 NOTE — Patient Instructions (Signed)
Lopressor 50 mg,,,,,,,,,,,, one tablet daily in the morning  Zocor 20 mg,,,,,,,,,,,, one tablet daily at bedtime

## 2013-02-26 NOTE — Progress Notes (Signed)
  Subjective:    Patient ID: Randy [name], male    DOB: [birth year], 79 y.o.   MRN: [id number]  HPI Randy [name] is an 79 year old male comes in today for evaluation of hypertension  He's blood pressure medication was increased to 50 mg twice a day Lopressor. BP today is 128/30 and is lightheaded when he stands up  He's on Zocor 20 mg daily for hyperlipidemia   Review of Systems Review of systems negative    Objective:   Physical Exam Well-developed and nourished male no acute distress BP 130/70 right arm sitting position pulse 60 and regular       Assessment & Plan:  Hypertension,,,,,,,,,, BP too low decrease Lopressor to 50 mg once daily  Hyperlipidemia Zocor 20 mg each bedtime

## 2013-03-20 ENCOUNTER — Encounter: Payer: Self-pay | Admitting: Family Medicine

## 2013-03-20 ENCOUNTER — Ambulatory Visit (INDEPENDENT_AMBULATORY_CARE_PROVIDER_SITE_OTHER): Payer: Medicare Other | Admitting: Family Medicine

## 2013-03-20 VITALS — BP 140/70 | HR 58 | Temp 97.9°F | Wt 163.0 lb

## 2013-03-20 DIAGNOSIS — F411 Generalized anxiety disorder: Secondary | ICD-10-CM

## 2013-03-20 DIAGNOSIS — I10 Essential (primary) hypertension: Secondary | ICD-10-CM

## 2013-03-20 NOTE — Progress Notes (Signed)
  Subjective:    Patient ID: KESHAUN DUBEY, male    DOB: 07-Oct-1930, 79 y.o.   MRN: [id number]  HPI  Elijah Birk is an 79 year old married male nonsmoker who comes in today for evaluation of 2 problems  He takes Lopressor 50 mg daily. He was concerned about his blood pressure slowly went to an EMS station. They checked his blood pressure which was normal and they also did a cardiogram.  He says he's having difficulty with emotional outbursts anxiety and business dealings with his son  Review of Systems    review of systems negative Objective:   Physical Exam  Well-developed well-nourished male no acute distress BP right arm sitting position 140/80 pulse 70 and regular      Assessment & Plan:  Hypertension at goal continue current therapy  Anxiety symptoms,,,,,,,,,,,,, consult with Judithe Modest

## 2013-03-20 NOTE — Patient Instructions (Signed)
Continue your blood pressure medication one daily  Since your blood pressure is normal check your blood pressure once weekly in the morning  Call and make arrangements to see Randy [name] for evaluation of the issues we discussed

## 2013-04-20 ENCOUNTER — Other Ambulatory Visit: Payer: Self-pay | Admitting: Family Medicine

## 2013-05-08 ENCOUNTER — Telehealth: Payer: Self-pay | Admitting: Family Medicine

## 2013-05-08 NOTE — Telephone Encounter (Signed)
Okay for 6 months 

## 2013-05-08 NOTE — Telephone Encounter (Signed)
Refill request for Zolpidem 10 mg take 1 po qhs prn

## 2013-05-09 MED ORDER — ZOLPIDEM TARTRATE 10 MG PO TABS: 10.0000 mg | ORAL_TABLET | Freq: Every evening | ORAL | Status: DC | PRN

## 2013-05-09 NOTE — Telephone Encounter (Signed)
I called in script 

## 2013-08-13 ENCOUNTER — Emergency Department (HOSPITAL_COMMUNITY): Payer: Medicare Other

## 2013-08-13 ENCOUNTER — Emergency Department (HOSPITAL_COMMUNITY)
Admission: EM | Admit: 2013-08-13 | Discharge: 2013-08-13 | Disposition: A | Discharge status: Home / Self Care | Payer: Medicare Other | Attending: Emergency Medicine | Admitting: Emergency Medicine

## 2013-08-13 ENCOUNTER — Encounter (HOSPITAL_COMMUNITY): Payer: Self-pay | Admitting: Emergency Medicine

## 2013-08-13 DIAGNOSIS — R51 Headache: Secondary | ICD-10-CM | POA: Insufficient documentation

## 2013-08-13 DIAGNOSIS — I2510 Atherosclerotic heart disease of native coronary artery without angina pectoris: Secondary | ICD-10-CM | POA: Insufficient documentation

## 2013-08-13 DIAGNOSIS — I498 Other specified cardiac arrhythmias: Secondary | ICD-10-CM | POA: Insufficient documentation

## 2013-08-13 DIAGNOSIS — Z79899 Other long term (current) drug therapy: Secondary | ICD-10-CM | POA: Insufficient documentation

## 2013-08-13 DIAGNOSIS — Z7982 Long term (current) use of aspirin: Secondary | ICD-10-CM | POA: Insufficient documentation

## 2013-08-13 DIAGNOSIS — E785 Hyperlipidemia, unspecified: Secondary | ICD-10-CM | POA: Insufficient documentation

## 2013-08-13 DIAGNOSIS — Z791 Long term (current) use of non-steroidal anti-inflammatories (NSAID): Secondary | ICD-10-CM | POA: Insufficient documentation

## 2013-08-13 DIAGNOSIS — Z859 Personal history of malignant neoplasm, unspecified: Secondary | ICD-10-CM | POA: Insufficient documentation

## 2013-08-13 DIAGNOSIS — I10 Essential (primary) hypertension: Secondary | ICD-10-CM | POA: Insufficient documentation

## 2013-08-13 HISTORY — DX: Atherosclerotic heart disease of native coronary artery without angina pectoris: I25.10

## 2013-08-13 HISTORY — DX: Malignant (primary) neoplasm, unspecified: C80.1

## 2013-08-13 LAB — COMPREHENSIVE METABOLIC PANEL
ALT: 15 U/L (ref 0–53)
AST: 25 U/L (ref 0–37)
Alkaline Phosphatase: 76 U/L (ref 39–117)
CO2: 26 mEq/L (ref 19–32)
Calcium: 9.8 mg/dL (ref 8.4–10.5)
Chloride: 103 mEq/L (ref 96–112)
GFR calc Af Amer: >90 mL/min (ref >90)
GFR calc non Af Amer: 78 mL/min — ABNORMAL LOW (ref >90)
Glucose, Bld: 94 mg/dL (ref 70–99)
Sodium: 141 mEq/L (ref 135–145)
Total Bilirubin: 0.6 mg/dL (ref 0.3–1.2)

## 2013-08-13 LAB — CBC WITH DIFFERENTIAL/PLATELET
Basophils Absolute: 0 10*3/uL (ref 0.0–0.1)
Eosinophils Relative: 1 % (ref 0–5)
HCT: 42.3 % (ref 39.0–52.0)
Lymphocytes Relative: 31 % (ref 12–46)
Lymphs Abs: 1.6 10*3/uL (ref 0.7–4.0)
MCV: 87 fL (ref 78.0–100.0)
Neutro Abs: 3.2 10*3/uL (ref 1.7–7.7)
Platelets: 163 10*3/uL (ref 150–400)
RBC: 4.86 MIL/uL (ref 4.22–5.81)
RDW: 13.3 % (ref 11.5–15.5)
WBC: 5.1 10*3/uL (ref 4.0–10.5)

## 2013-08-13 LAB — URINALYSIS, ROUTINE W REFLEX MICROSCOPIC
Bilirubin Urine: NEGATIVE
Hgb urine dipstick: NEGATIVE
Ketones, ur: NEGATIVE mg/dL
Nitrite: NEGATIVE
Protein, ur: NEGATIVE mg/dL
Urobilinogen, UA: 0.2 mg/dL (ref 0.0–1.0)

## 2013-08-13 MED ORDER — NAPROXEN 250 MG PO TABS
500.0000 mg | ORAL_TABLET | Freq: Once | ORAL | Status: AC
  Administered 2013-08-13: 500 mg via ORAL
  Filled 2013-08-13: qty 2

## 2013-08-13 MED ORDER — NAPROXEN 500 MG PO TABS: 500.0000 mg | ORAL_TABLET | Freq: Two times a day (BID) | ORAL | Status: DC

## 2013-08-13 NOTE — ED Notes (Signed)
Dr. Miller at the bedside.  

## 2013-08-13 NOTE — ED Provider Notes (Signed)
CSN: [id number]     Arrival date & time 08/13/13  1114 History   First MD Initiated Contact with Patient 08/13/13 1453     Chief Complaint  Patient presents with  . Headache  . 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  He has no hx of headaches in the past.  He reduced his lopressor from 50 bid to qdaily in April and has not had BP checked since - it is elevated today - went to EMS station and checked at 199/100's pta.  Patient is a 79 y.o. male presenting with headaches and hypertension. The history is provided by the patient.  Headache Hypertension Associated symptoms include headaches.    Past Medical History  Diagnosis Date  . Hyperlipidemia   . Hypertension   . Coronary artery disease   . Cancer    History reviewed. No pertinent past surgical history. History reviewed. No pertinent family history. History  Substance Use Topics  . Smoking status: Never Smoker   . Smokeless tobacco: Never Used  . Alcohol Use: No    Review of Systems  Neurological: Positive for headaches.  All other systems reviewed and are negative.    Allergies  Nsaids  Home Medications   Current Outpatient Rx  Name  Route  Sig  Dispense  Refill  . aspirin 325 MG EC tablet   Oral   Take 325 mg by mouth every morning.         . Calcium Carbonate-Vitamin D (CALCIUM + D PO)   Oral   Take 1 tablet by mouth every evening.         . fish oil-omega-3 fatty acids 1000 MG capsule   Oral   Take 2 g by mouth 2 (two) times daily.         . metoprolol (LOPRESSOR) 50 MG tablet   Oral   Take 50 mg by mouth  daily.         . niacin (NIASPAN) 500 MG CR tablet   Oral   Take 500 mg by mouth at bedtime.         . nitroGLYCERIN (NITROSTAT) 0.4 MG SL tablet   Sublingual   Place 0.4 mg under the tongue every 5 (five) minutes as needed for chest pain.         . simvastatin (ZOCOR) 20 MG tablet   Oral   Take 1 tablet (20 mg total) by mouth at bedtime.   100 tablet   3   . zolpidem (AMBIEN) 10 MG tablet   Oral   Take 5-10 mg by mouth at bedtime as needed for sleep.         . naproxen (NAPROSYN) 500 MG tablet   Oral   Take 1 tablet (500 mg total) by mouth 2 (two) times daily with a meal.   20 tablet   0    BP 122/70  Pulse 57  Temp(Src) 97.5 F (36.4 C) (Oral)  Resp 22  Ht 5\' 9"  (1.753 m)  Wt 157 lb (71.215 kg)  BMI 23.17 kg/m2  SpO2 100% Physical Exam  Nursing note and vitals reviewed. Constitutional: He appears well-developed and  well-nourished. No distress.  HENT:  Head: Normocephalic and atraumatic.  Mouth/Throat: Oropharynx is clear and moist. No oropharyngeal exudate.  Eyes: Conjunctivae and EOM are normal. Pupils are equal, round, and reactive to light. Right eye exhibits no discharge. Left eye exhibits no discharge. No scleral icterus.  Neck: Normal range of motion. Neck supple. No JVD present. No thyromegaly present.  Cardiovascular: Regular rhythm, normal heart sounds and intact distal pulses.  Exam reveals no gallop and no friction rub.   No murmur heard. Slight bradycardia.  Normal pulses, no JVD  Pulmonary/Chest: Effort normal and breath sounds normal. No respiratory distress. He has no wheezes. He has no rales.  Abdominal: Soft. Bowel sounds are normal. He exhibits no distension and no mass. There is no tenderness.  Musculoskeletal: Normal range of motion. He exhibits no edema and no tenderness.  No peripheral edema.  Lymphadenopathy:    He has no cervical adenopathy.  Neurological: He is alert. Coordination normal.  Neurologic exam:  Speech clear,  pupils equal round reactive to light, extraocular movements intact  Normal peripheral visual fields Cranial nerves III through XII normal including no facial droop Follows commands, moves all extremities x4, normal strength to bilateral upper and lower extremities at all major muscle groups including grip Sensation normal to light touch and pinprick Coordination intact, no limb ataxia, finger-nose-finger normal Rapid alternating movements normal No pronator drift Gait normal   Skin: Skin is warm and dry. No rash noted. No erythema.  Psychiatric: He has a normal mood and affect. His behavior is normal.    ED Course  Procedures (including critical care time) Labs Review Labs Reviewed  COMPREHENSIVE METABOLIC PANEL - Abnormal; Notable for the following:    GFR calc non Af Amer 78 (*)    All other components within normal limits  CBC WITH DIFFERENTIAL  URINALYSIS, ROUTINE W REFLEX MICROSCOPIC   Imaging Review Ct Head Wo Contrast  08/13/2013   CLINICAL DATA:  Headache for 2 weeks  EXAM: CT HEAD WITHOUT CONTRAST  TECHNIQUE: Contiguous axial images were obtained from the base of the skull through the vertex without intravenous contrast.  COMPARISON:  None.  FINDINGS: Calvarium intact. Mild to moderate diffuse atrophy. No abnormal attenuation to suggest hemorrhage, infarct, or mass. No hydrocephalus.  IMPRESSION: No acute findings   Electronically Signed   By: Esperanza Heir M.D.   On: 08/13/2013 16:04    MDM   1. Headache   2. Hypertension    Overall pt is well appearing, he declines pain medicines - has slight hypertension but no fever, no other distress and no focal neuro defecits.    CT scan as the pt is older and has new onset of headaches.  BP to be rechecked but not severely elevated at this time.  CT negative, pt is neuro intact - asked to f/u with PMD - may need neuro consult - short term NSAIDs - he will f/u for BP recheck as well.  However at d/c had improved  drastically.  Filed Vitals:   08/13/13 1645 08/13/13 1700 08/13/13 1715 08/13/13 1730  BP: 147/67 130/60 136/55 122/70  Pulse: 56 58 57   Temp:      TempSrc:      Resp: 19 24 17 22   Height:      Weight:      SpO2: 100% 100% 100%    Meds given in ED:  Medications  naproxen (NAPROSYN) tablet 500 mg (500 mg Oral Given 08/13/13 1742)    Discharge  Medication List as of 08/13/2013  5:36 PM        Vida Roller, MD 08/14/13 0003

## 2013-08-13 NOTE — ED Notes (Signed)
Pt c/o HA that is dull x2 weeks; pt sts hypertension today; pt sts PCP lowered BP meds in April

## 2013-08-14 ENCOUNTER — Telehealth: Payer: Self-pay | Admitting: Family Medicine

## 2013-08-14 NOTE — Telephone Encounter (Signed)
Pt wife states that he needs a post hosp fu in 1 week. Please advise for scheduling.

## 2013-08-15 NOTE — Telephone Encounter (Signed)
Done

## 2013-08-15 NOTE — Telephone Encounter (Signed)
Please schedule patient for 08/21/13 at 12:30 patient is aware. 30 minutes please.

## 2013-08-20 ENCOUNTER — Telehealth: Payer: Self-pay | Admitting: Family Medicine

## 2013-08-20 NOTE — Telephone Encounter (Signed)
Pt son called and stated that his BP was 193/85 a few minutes ago, and he states that he needs seen asap. Please advise.

## 2013-08-20 NOTE — Telephone Encounter (Signed)
Per Dr Tawanna Cooler, patient to take BP med now, complete bed rest, take another tab at bedtime if BP is still up.  Patient has an appointment tomorrow.  Patient verbalized understanding.

## 2013-08-20 NOTE — Telephone Encounter (Signed)
Patient Information:  Caller Name: Randy [name]  Phone: [phone]  Patient: Randy, [name]  Gender: Male  DOB: Apr 15, 1930  Age: 79 Years  PCP: Kelle Darting [facility])  Office Follow Up:  Does the office need to follow up with this patient?: No  Instructions For The Office: N/A   Symptoms  Reason For Call & Symptoms: Calling about being seen in the ER last week for elevated BP and told to f/u with Dr. Tawanna Cooler. Scheduled to come in on 08/21/13. Called today to report BP= 193/85 pulse = 59 and he has some indigestion and continued mild headache. He felt fine when he first woke up. Symptoms started after eating. Recheck BP =194/84 pulse =60 @ 1345. Son has talked with RN in the office and was advised to have his dad take another tab of Meroporol now and rest. If BP still elevated tonight he was instructed to repeat med tonight. Randy [name] states that he will take BP med now and if not feeling better within an hour he is going to go back to ER.  Reviewed Health History In EMR: Yes  Reviewed Medications In EMR: Yes  Reviewed Allergies In EMR: Yes  Reviewed Surgeries / Procedures: Yes  Date of Onset of Symptoms: 08/20/2013  Guideline(s) Used:  High Blood Pressure  Disposition Per Guideline:   See Today in Office  Reason For Disposition Reached:   BP > 180/110  Advice Given:  Call Back If:  Headache, blurred vision, difficulty talking, or difficulty walking occurs  Chest pain or difficulty breathing occurs  You want to come in to the office for a blood pressure check  You become worse.  Patient Refused Recommendation:  Patient Will Follow Up With Office Later  Spoke with RN in the office and instructions given.

## 2013-08-21 ENCOUNTER — Encounter: Payer: Self-pay | Admitting: Family Medicine

## 2013-08-21 ENCOUNTER — Ambulatory Visit (INDEPENDENT_AMBULATORY_CARE_PROVIDER_SITE_OTHER): Payer: Medicare Other | Admitting: Family Medicine

## 2013-08-21 VITALS — BP 120/60 | Temp 97.6°F | Wt 158.0 lb

## 2013-08-21 DIAGNOSIS — I10 Essential (primary) hypertension: Secondary | ICD-10-CM

## 2013-08-21 DIAGNOSIS — Z23 Encounter for immunization: Secondary | ICD-10-CM

## 2013-08-21 NOTE — Patient Instructions (Signed)
Stop the Naprosyn  In the future do not take anything except Tylenol............ no NSAIDs  Lopressor 50 mg twice daily  Check a blood pressure daily in the morning  Return in 3 weeks for followup

## 2013-08-21 NOTE — Progress Notes (Signed)
  Subjective:    Patient ID: Randy [name], male    DOB: [birth year], 79 y.o.   MRN: [id number]  HPI Randy [name] is an 79 year old married male nonsmoker who comes in today for evaluation of hypertension  He's been Lopressor 50 mg daily with good blood pressure control. A week ago he went emerge from for evaluation of a headache diagnostic studies are normal and he was given Naprosyn 500 mg twice a day to take for the headache.  The Naprosyn may of been what made his blood pressure should up  He was given an extra dose of Toprol yesterday placed at bedrest BP today 120/60   Review of Systems Review of systems negative    Objective:   Physical Exam  Well-developed well-nourished male no acute distress BP right arm sitting position 120/60 pulse 70 and regular cardiopulmonary exam normal  Hypertension probably increased from NSAIDs      Assessment & Plan:  Hypertension probable increased blood pressure from NSAIDs plan.......... discontinue all NSAIDs Lopressor twice a day

## 2013-09-11 ENCOUNTER — Ambulatory Visit (INDEPENDENT_AMBULATORY_CARE_PROVIDER_SITE_OTHER): Payer: Medicare Other | Admitting: Family Medicine

## 2013-09-11 ENCOUNTER — Encounter: Payer: Self-pay | Admitting: Family Medicine

## 2013-09-11 VITALS — BP 110/60 | Temp 97.3°F | Wt 157.0 lb

## 2013-09-11 DIAGNOSIS — I10 Essential (primary) hypertension: Secondary | ICD-10-CM

## 2013-09-11 MED ORDER — LISINOPRIL 2.5 MG PO TABS: 2.5000 mg | ORAL_TABLET | Freq: Every day | ORAL | Status: DC

## 2013-09-11 NOTE — Patient Instructions (Signed)
Continue the Lopressor 50 mg,,,,,,,,, 1 in the morning one at bedtime  And Zestril 2.5 mg,,,,,,,, one tablet daily in the morning  Continue to check your blood pressure daily in the morning  Return in 4 weeks for followup  When he returned bring a record of all your blood pressure readings and the device

## 2013-09-11 NOTE — Progress Notes (Signed)
  Subjective:    Patient ID: Randy [name], male    DOB: July 08, 1930, 79 y.o.   MRN: [id number]  HPI Randy [name] 79 year old male who comes in today for reevaluation of hypertension  We increased his beta blocker to 50 mg twice a day because his blood pressure was elevated. He's been monitoring his blood pressure at home. It's averaging 160/80  BP by me right arm sitting position 160/80 pulse 70 and regular  No side effects from increase in medication except he feels fatigued.   Review of Systems    review of systems otherwise negative Objective:   Physical Exam  Well-developed and nourished male no acute distress BP right I'm sitting position 156/80 same with our cuff and his      Assessment & Plan:  Hypertension not at goal.......... add ACE inhibitor 2.5 mg

## 2013-09-26 ENCOUNTER — Other Ambulatory Visit: Payer: Self-pay | Admitting: Family Medicine

## 2013-10-11 ENCOUNTER — Ambulatory Visit (INDEPENDENT_AMBULATORY_CARE_PROVIDER_SITE_OTHER): Payer: Medicare Other | Admitting: Family Medicine

## 2013-10-11 ENCOUNTER — Encounter: Payer: Self-pay | Admitting: Family Medicine

## 2013-10-11 VITALS — BP 130/64 | Temp 97.9°F | Wt 157.0 lb

## 2013-10-11 DIAGNOSIS — I10 Essential (primary) hypertension: Secondary | ICD-10-CM

## 2013-10-11 DIAGNOSIS — E785 Hyperlipidemia, unspecified: Secondary | ICD-10-CM

## 2013-10-11 MED ORDER — NITROGLYCERIN 0.4 MG SL SUBL: 0.4000 mg | SUBLINGUAL_TABLET | SUBLINGUAL | Status: DC | PRN

## 2013-10-11 MED ORDER — SIMVASTATIN 20 MG PO TABS: 20.0000 mg | ORAL_TABLET | Freq: Every day | ORAL | Status: DC

## 2013-10-11 NOTE — Progress Notes (Signed)
   Subjective:    Patient ID: Randy [name], male    DOB: 1930-02-03, 79 y.o.   MRN: [id number]  HPI Elijah Birk is a 79 year old male who comes in today for evaluation of hypertension  He takes lisinopril 2.5 mg daily along with Lopressor 50 mg twice a day. BP 130/64 pulse 70 and regular   Review of Systems Review of systems otherwise negative except he needs a refill on his simvastatin 20 mg    Objective:   Physical Exam  Well-developed well-nourished male no acute distress vital signs stable he is afebrile BP 130/64      Assessment & Plan:  Hypertension at goal continue current therapy  Hyperlipidemia refill Zocor 20 mg daily

## 2013-10-11 NOTE — Patient Instructions (Signed)
Continue current medication  The Zocor is a 20 mg tab,,,,,,,,,,, take one tablet daily I sent him a new prescription

## 2013-10-11 NOTE — Progress Notes (Signed)
Pre visit review using our clinic review tool, if applicable. No additional management support is needed unless otherwise documented below in the visit note. 

## 2013-12-12 ENCOUNTER — Other Ambulatory Visit: Payer: Self-pay | Admitting: Family Medicine

## 2013-12-13 NOTE — Telephone Encounter (Signed)
This is a patient of Dr. Sherren Mocha

## 2013-12-14 MED ORDER — ZOLPIDEM TARTRATE 5 MG PO TABS: 5.0000 mg | ORAL_TABLET | Freq: Every evening | ORAL | Status: DC | PRN

## 2013-12-25 ENCOUNTER — Ambulatory Visit: Payer: Medicare Other | Admitting: Family Medicine

## 2014-07-03 ENCOUNTER — Other Ambulatory Visit: Payer: Self-pay | Admitting: Family Medicine

## 2014-07-11 ENCOUNTER — Other Ambulatory Visit: Payer: Self-pay | Admitting: Family Medicine

## 2014-07-18 ENCOUNTER — Ambulatory Visit (INDEPENDENT_AMBULATORY_CARE_PROVIDER_SITE_OTHER): Payer: Medicare Other | Admitting: *Deleted

## 2014-07-18 DIAGNOSIS — Z23 Encounter for immunization: Secondary | ICD-10-CM

## 2014-08-13 ENCOUNTER — Telehealth: Payer: Self-pay | Admitting: Family Medicine

## 2014-08-13 ENCOUNTER — Ambulatory Visit (INDEPENDENT_AMBULATORY_CARE_PROVIDER_SITE_OTHER): Payer: Medicare Other | Admitting: Family Medicine

## 2014-08-13 ENCOUNTER — Encounter: Payer: Self-pay | Admitting: Family Medicine

## 2014-08-13 VITALS — BP 120/60 | Temp 97.6°F | Wt 149.0 lb

## 2014-08-13 DIAGNOSIS — I10 Essential (primary) hypertension: Secondary | ICD-10-CM

## 2014-08-13 NOTE — Progress Notes (Signed)
   Subjective:    Patient ID: Randy [name], male    DOB: Jun 14, 1930, 80 y.o.   MRN: [id number]  HPI Randy [name] is a 80 year old male who comes in today for evaluation of hypertension  He states he didn't feel well yesterday and went to the fire department and had one of the fireman check his blood pressure. He was told his blood pressure is 140/80 right arm 154/84 in his left arm. He went home and doubled his beta blocker. He had been on 25 mg twice a day he took 50 mg last night and a 50 mg pill this morning. BP right arm sitting position now 130/70. Pulse 50 and regular. We had been trying to taper him off of the beta blocker because of low heart rate. We've given him some lisinopril which he never took.  His wife spends a lot of time in pulmonary because she is underlying COPD. He has heard that I am going to retire in the spring and wants to know if they can both transferred to [place] office,,,,,,,, okay with me,,,,,,,,,,, offered to stay here [place]....... however they said is more convenient for them to go to the main office   Review of Systems Review of systems otherwise negative    Objective:   Physical Exam Well-developed well-nourished male in no acute distress vital signs stable he is afebrile BP right sitting position 130/80 pulse 50       Assessment & Plan:  Hypertension with low heart rate............ go back to one half tablet twice a day check blood pressure daily in the morning followup here in 3 weeks with the data and the device

## 2014-08-13 NOTE — Telephone Encounter (Signed)
FYI

## 2014-08-13 NOTE — Patient Instructions (Signed)
Lopressor 50 mg.............. one half tab twice daily   Check your blood pressure daily in the morning  Return in 3 weeks with all your blood pressure readings and the device  If the nausea persists then I would recommend he see a gastroenterologist

## 2014-08-13 NOTE — Telephone Encounter (Signed)
Caller: Jeanette/Spouse; Phone: [phone]. 08/13/14 - Wife is calling about her husband who is currently taking : Metoprolol 50 mg BID & Lisinopril 2.5 mg QD. He has been taking both medications for quite awhile. His wife is calling because he has been Sluggish, More Tired than normal, Nauseous from the smell of food & also unable to eat & he has also had a Headache for several days that he can get rid of every once-in-awhile with Acetaminophen & Hydrocodone. She states that around 08/03/14 he apparently stopped taking (by accident). The last blood pressure he has written down is : 144/82 on10/03/15, which is higher than normal. He is not experiencing any symptoms at this time. Advised her that he should come in to the office today (08/13/14) so he can be examined and a new prescription can be given to him. The one he currently has has no more refills and expires on 09/21/2014.

## 2014-08-13 NOTE — Telephone Encounter (Signed)
noted 

## 2014-08-13 NOTE — Telephone Encounter (Signed)
Con't  Documentation: 08/13/2014 @ 11:25 am. Appointment Scheduled for 08/13/14 @ 3:30 pm with Dr. Sherren Mocha. Advised to call office if patient starts having any symptoms of chest pain, shortness of breath, severe headache or dizziness. Patient's Spouse Agreed.

## 2014-08-14 ENCOUNTER — Telehealth: Payer: Self-pay | Admitting: Family Medicine

## 2014-08-14 NOTE — Telephone Encounter (Signed)
Spoke with patient and went over directions

## 2014-08-14 NOTE — Telephone Encounter (Signed)
Pt called to ask if he should be taking  metoprolol (LOPRESSOR) 50 MG tablet twice a day or   1/2 tablet twice a day as per his instructions on yesterday.Pt would like a call back please

## 2014-08-26 ENCOUNTER — Telehealth: Payer: Self-pay | Admitting: Family Medicine

## 2014-08-26 NOTE — Telephone Encounter (Signed)
Patient Information:  Caller Name: Romaine  Phone: [phone]  Patient: Randy [name], Randy [name]  Gender: Male  DOB: April 21, 1930  Age: 80 Years  PCP: Stevie Kern [facility])  Office Follow Up:  Does the office need to follow up with this patient?: No  Instructions For The Office: N/A  RN Note:  Per MD from last OV on 08/13/14, MD instructed pt to f/u in the office in 3 weeks.  Pt has an appt on 09/05/14 and will see MD at this time; pt will continue to monitor BP and if BP increases any more or if he develops any sx, will call back.  Symptoms  Reason For Call & Symptoms: FOLLOW UP CALL: Pt is calling back and states that he re checked BP by EMT and BP was 138/82; denies any sx at present time  Reviewed Health History In EMR: Yes  Reviewed Medications In EMR: Yes  Reviewed Allergies In EMR: Yes  Reviewed Surgeries / Procedures: Yes  Date of Onset of Symptoms: Unknown  Guideline(s) Used:  High Blood Pressure  Disposition Per Guideline:   See Within 2 Weeks in Office  Reason For Disposition Reached:   BP > 130/80 and history of heart problems, kidney disease, or diabetes  Advice Given:  Call Back If:  You become worse.  RN Overrode Recommendation:  Patient Already Has Appt, Document Patient  Pt has an appt on 09/05/14

## 2014-08-26 NOTE — Telephone Encounter (Signed)
Noted  

## 2014-08-26 NOTE — Telephone Encounter (Signed)
Patient Information:  Caller Name: Archie  Phone: [phone]  Patient: Randy [name], Randy [name]  Gender: Male  DOB: Feb 08, 1930  Age: 80 Years  PCP: Stevie Kern [facility])  Office Follow Up:  Does the office need to follow up with this patient?: No  Instructions For The Office: N/A  RN Note:  Offered to make pt an appt but declined at this time.  He states that he wants to go have his BP re checked by an EMT and will call us back for an appt.  Symptoms  Reason For Call & Symptoms: Pt is calling and states that MD decreased Metroprolol to half normal dose;  he was  to take BP daily for 3 weeks; pt has noticed  his blood pressures have been increasing daily;  BP 173/86 at 10:05 today 08/26/14; this is the highest reading pressures have been since changing the blood pressure meds on 08/13/14;   feeling a little decrease in energy but no other sx noted; pt states that Lisinopril was discontinued and Lopressor was decreased to 25mg  BID  Reviewed Health History In EMR: Yes  Reviewed Medications In EMR: Yes  Reviewed Allergies In EMR: Yes  Reviewed Surgeries / Procedures: Yes  Date of Onset of Symptoms: 08/13/2014  Guideline(s) Used:  High Blood Pressure  Disposition Per Guideline:   See Within 2 Weeks in Office  Reason For Disposition Reached:   BP > 130/80 and history of heart problems, kidney disease, or diabetes  Advice Given:  Call Back If:  Headache, blurred vision, difficulty talking, or difficulty walking occurs  Chest pain or difficulty breathing occurs  You become worse.  Patient Will Follow Care Advice:  YES

## 2014-09-05 ENCOUNTER — Ambulatory Visit: Payer: Medicare Other | Admitting: Family Medicine

## 2014-10-17 ENCOUNTER — Ambulatory Visit (INDEPENDENT_AMBULATORY_CARE_PROVIDER_SITE_OTHER): Payer: Medicare Other | Admitting: Internal Medicine

## 2014-10-17 ENCOUNTER — Encounter: Payer: Self-pay | Admitting: Internal Medicine

## 2014-10-17 ENCOUNTER — Other Ambulatory Visit (INDEPENDENT_AMBULATORY_CARE_PROVIDER_SITE_OTHER): Payer: Medicare Other

## 2014-10-17 VITALS — BP 118/68 | HR 57 | Temp 98.1°F | Resp 18 | Ht 69.0 in | Wt 156.8 lb

## 2014-10-17 DIAGNOSIS — E785 Hyperlipidemia, unspecified: Secondary | ICD-10-CM

## 2014-10-17 DIAGNOSIS — I10 Essential (primary) hypertension: Secondary | ICD-10-CM

## 2014-10-17 DIAGNOSIS — I2510 Atherosclerotic heart disease of native coronary artery without angina pectoris: Secondary | ICD-10-CM

## 2014-10-17 DIAGNOSIS — J309 Allergic rhinitis, unspecified: Secondary | ICD-10-CM | POA: Insufficient documentation

## 2014-10-17 DIAGNOSIS — G4700 Insomnia, unspecified: Secondary | ICD-10-CM

## 2014-10-17 DIAGNOSIS — J302 Other seasonal allergic rhinitis: Secondary | ICD-10-CM

## 2014-10-17 DIAGNOSIS — Z0000 Encounter for general adult medical examination without abnormal findings: Secondary | ICD-10-CM

## 2014-10-17 LAB — LIPID PANEL
Cholesterol: 129 mg/dL (ref 0–200)
HDL: 42.5 mg/dL (ref >39.00)
LDL CALC: 66 mg/dL (ref 0–99)
NONHDL: 86.5
Total CHOL/HDL Ratio: 3
Triglycerides: 103 mg/dL (ref 0.0–149.0)
VLDL: 20.6 mg/dL (ref 0.0–40.0)

## 2014-10-17 LAB — BASIC METABOLIC PANEL
BUN: 26 mg/dL — ABNORMAL HIGH (ref 6–23)
CHLORIDE: 103 meq/L (ref 96–112)
CO2: 26 mEq/L (ref 19–32)
Calcium: 9.4 mg/dL (ref 8.4–10.5)
Creatinine, Ser: 0.9 mg/dL (ref 0.4–1.5)
GFR: 85.73 mL/min (ref >60.00)
GLUCOSE: 96 mg/dL (ref 70–99)
POTASSIUM: 4.9 meq/L (ref 3.5–5.1)
Sodium: 136 mEq/L (ref 135–145)

## 2014-10-17 MED ORDER — FLUTICASONE PROPIONATE 50 MCG/ACT NA SUSP: 2.0000 | Freq: Every day | NASAL | Status: DC

## 2014-10-17 NOTE — Assessment & Plan Note (Signed)
Currently taking Ambien and doing well.

## 2014-10-17 NOTE — Progress Notes (Signed)
   Subjective:    Patient ID: Randy [name], male    DOB: 1930/08/22, 80 y.o.   MRN: [id number]  HPI The patient is an 80 year old man comes in today to establish care. He does have past medical history of hypertension, hyperlipidemia, remote history of prostate cancer. He continues to follow with urology for his prostate cancer. His PSAs have been less than 1. He denies any joint pain or discomfort. He denies any chest pain, shortness of breath, abdominal pains. He denies any balance problems or falls. He is having his tonsils which is that his left nostril gets fully obstructed at nighttime and he feels as though it's clogged. Previously he was on the nose spray which worked very well however he has run out.  Here for medicare wellness  Diet: heart healthy Physical activity: Moderate Depression/mood screen: negative Hearing: intact to whispered voice Visual acuity: grossly normal, performs annual eye exam  ADLs: capable Fall risk: none Home safety: good Cognitive evaluation: intact to orientation, naming, recall and repetition EOL planning: adv directives, full code/ I agree  I have personally reviewed and have noted 1. The patient's medical and social history 2. Their use of alcohol, tobacco or illicit drugs 3. Their current medications and supplements 4. The patient's functional ability including ADL's, fall risks, home safety risks and hearing or visual impairment. 5. Diet and physical activities 6. Evidence for depression or mood disorders 7. Care team reviewed and updated  Review of Systems  Constitutional: Negative for fever, activity change, appetite change, fatigue and unexpected weight change.  HENT: Positive for congestion. Negative for postnasal drip, rhinorrhea and sinus pressure.   Respiratory: Negative for cough, chest tightness, shortness of breath and wheezing.   Cardiovascular: Negative for chest pain, palpitations and leg swelling.  Gastrointestinal: Negative  for nausea, abdominal pain, diarrhea, constipation and abdominal distention.  Musculoskeletal: Negative.   Skin: Negative.   Neurological: Negative.       Objective:   Physical Exam  Constitutional: He is oriented to person, place, and time. He appears well-developed and well-nourished.  HENT:  Head: Normocephalic and atraumatic.  Eyes: EOM are normal.  Neck: Normal range of motion.  Cardiovascular: Normal rate and regular rhythm.   Pulmonary/Chest: Effort normal and breath sounds normal. No respiratory distress. He has no wheezes. He has no rales.  Abdominal: Soft. Bowel sounds are normal. He exhibits no distension. There is no tenderness. There is no rebound.  Musculoskeletal: Normal range of motion.  Neurological: He is alert and oriented to person, place, and time. Coordination normal.  Skin: Skin is warm and dry.   Filed Vitals:   10/17/14 1311  BP: 118/68  Pulse: 57  Temp: 98.1 F (36.7 C)  TempSrc: Oral  Resp: 18  Height: 5\' 9"  (1.753 m)  Weight: 156 lb 12.8 oz (71.124 kg)  SpO2: 98%      Assessment & Plan:

## 2014-10-17 NOTE — Assessment & Plan Note (Signed)
We'll try Flonase daily to see if this helps with his nasal congestion. Advised not to pick as this can cause thinning of the mucosa and bleeding.

## 2014-10-17 NOTE — Assessment & Plan Note (Signed)
BP well-controlled on Lopressor 50 mg every morning 25 mg every afternoon. Check BMP today. Heart rate 57 today.

## 2014-10-17 NOTE — Progress Notes (Signed)
Pre visit review using our clinic review tool, if applicable. No additional management support is needed unless otherwise documented below in the visit note. 

## 2014-10-17 NOTE — Assessment & Plan Note (Signed)
History of remote MI, continues to take beta blocker, aspirin, statin. Denies any anginal chest pains.

## 2014-10-17 NOTE — Patient Instructions (Signed)
We will check her blood work today, we will call you back with those results.  You can continue taking 1 pill in the morning and one half pill in the evening of your metoprolol.  We will have you come back in about 6 months for blood pressure check. If you have any new problems or questions before then please feel free to call our office.

## 2014-12-12 DIAGNOSIS — S43422A Sprain of left rotator cuff capsule, initial encounter: Secondary | ICD-10-CM | POA: Diagnosis not present

## 2015-01-03 ENCOUNTER — Telehealth: Payer: Self-pay | Admitting: Internal Medicine

## 2015-01-07 ENCOUNTER — Other Ambulatory Visit: Payer: Self-pay | Admitting: Geriatric Medicine

## 2015-01-07 DIAGNOSIS — E785 Hyperlipidemia, unspecified: Secondary | ICD-10-CM

## 2015-01-07 MED ORDER — SIMVASTATIN 20 MG PO TABS: 20.0000 mg | ORAL_TABLET | Freq: Every day | ORAL | Status: DC

## 2015-01-07 NOTE — Telephone Encounter (Signed)
Sent to pharmacy 

## 2015-01-07 NOTE — Telephone Encounter (Signed)
Pt called stated is not happy with the delay that Mr. Safley is having with medication. Please check and call pt back

## 2015-01-28 ENCOUNTER — Other Ambulatory Visit: Payer: Self-pay | Admitting: Internal Medicine

## 2015-01-28 NOTE — Telephone Encounter (Signed)
Called pharmacy spoke with kristen gave md approval.../lmb

## 2015-02-10 ENCOUNTER — Encounter (HOSPITAL_COMMUNITY): Payer: Self-pay | Admitting: Emergency Medicine

## 2015-02-10 ENCOUNTER — Emergency Department (HOSPITAL_COMMUNITY): Payer: Medicare Other

## 2015-02-10 ENCOUNTER — Telehealth: Payer: Self-pay

## 2015-02-10 ENCOUNTER — Telehealth: Payer: Self-pay | Admitting: Internal Medicine

## 2015-02-10 ENCOUNTER — Emergency Department (HOSPITAL_COMMUNITY)
Admission: EM | Admit: 2015-02-10 | Discharge: 2015-02-10 | Discharge status: Against Medical Advice | Payer: Medicare Other | Attending: Emergency Medicine | Admitting: Emergency Medicine

## 2015-02-10 ENCOUNTER — Emergency Department (HOSPITAL_COMMUNITY)
Admission: EM | Admit: 2015-02-10 | Discharge: 2015-02-11 | Disposition: A | Discharge status: Home / Self Care | Payer: Medicare Other | Attending: Emergency Medicine | Admitting: Emergency Medicine

## 2015-02-10 DIAGNOSIS — I10 Essential (primary) hypertension: Secondary | ICD-10-CM | POA: Insufficient documentation

## 2015-02-10 DIAGNOSIS — R51 Headache: Secondary | ICD-10-CM | POA: Diagnosis not present

## 2015-02-10 DIAGNOSIS — Z7951 Long term (current) use of inhaled steroids: Secondary | ICD-10-CM | POA: Diagnosis not present

## 2015-02-10 DIAGNOSIS — Z859 Personal history of malignant neoplasm, unspecified: Secondary | ICD-10-CM | POA: Insufficient documentation

## 2015-02-10 DIAGNOSIS — I252 Old myocardial infarction: Secondary | ICD-10-CM | POA: Insufficient documentation

## 2015-02-10 DIAGNOSIS — E785 Hyperlipidemia, unspecified: Secondary | ICD-10-CM | POA: Insufficient documentation

## 2015-02-10 DIAGNOSIS — I2510 Atherosclerotic heart disease of native coronary artery without angina pectoris: Secondary | ICD-10-CM | POA: Insufficient documentation

## 2015-02-10 DIAGNOSIS — Z7982 Long term (current) use of aspirin: Secondary | ICD-10-CM | POA: Diagnosis not present

## 2015-02-10 DIAGNOSIS — Z79899 Other long term (current) drug therapy: Secondary | ICD-10-CM | POA: Insufficient documentation

## 2015-02-10 DIAGNOSIS — R079 Chest pain, unspecified: Secondary | ICD-10-CM | POA: Diagnosis present

## 2015-02-10 DIAGNOSIS — R519 Headache, unspecified: Secondary | ICD-10-CM

## 2015-02-10 HISTORY — DX: Acute myocardial infarction, unspecified: I21.9

## 2015-02-10 LAB — I-STAT CHEM 8, ED
BUN: 17 mg/dL (ref 6–23)
Calcium, Ion: 1.16 mmol/L (ref 1.13–1.30)
Chloride: 103 mmol/L (ref 96–112)
Creatinine, Ser: 0.9 mg/dL (ref 0.50–1.35)
Glucose, Bld: 126 mg/dL — ABNORMAL HIGH (ref 70–99)
HCT: 43 % (ref 39.0–52.0)
Hemoglobin: 14.6 g/dL (ref 13.0–17.0)
Potassium: 4.3 mmol/L (ref 3.5–5.1)
Sodium: 140 mmol/L (ref 135–145)
TCO2: 20 mmol/L (ref 0–100)

## 2015-02-10 LAB — CBC WITH DIFFERENTIAL/PLATELET
BASOS PCT: 0 % (ref 0–1)
Basophils Absolute: 0 10*3/uL (ref 0.0–0.1)
Eosinophils Absolute: 0.1 10*3/uL (ref 0.0–0.7)
Eosinophils Relative: 1 % (ref 0–5)
HCT: 41.7 % (ref 39.0–52.0)
Hemoglobin: 14 g/dL (ref 13.0–17.0)
Lymphocytes Relative: 33 % (ref 12–46)
Lymphs Abs: 2 10*3/uL (ref 0.7–4.0)
MCH: 29.9 pg (ref 26.0–34.0)
MCHC: 33.6 g/dL (ref 30.0–36.0)
MCV: 89.1 fL (ref 78.0–100.0)
MONO ABS: 0.4 10*3/uL (ref 0.1–1.0)
Monocytes Relative: 7 % (ref 3–12)
NEUTROS PCT: 59 % (ref 43–77)
Neutro Abs: 3.6 10*3/uL (ref 1.7–7.7)
Platelets: 159 10*3/uL (ref 150–400)
RBC: 4.68 MIL/uL (ref 4.22–5.81)
RDW: 13.8 % (ref 11.5–15.5)
WBC: 6.2 10*3/uL (ref 4.0–10.5)

## 2015-02-10 LAB — I-STAT TROPONIN, ED: Troponin i, poc: 0 ng/mL (ref 0.00–0.08)

## 2015-02-10 NOTE — ED Notes (Signed)
Pt states that this morning he had a headache so he got his blood pressure checked at the fire department, found it to be 190/100. Pt's blood pressure currently 126/72. Pt denies headache currently.

## 2015-02-10 NOTE — Telephone Encounter (Signed)
Would recommend that he is seen somewhere today or tomorrow. Would not advise to take an extra metoprolol.

## 2015-02-10 NOTE — ED Notes (Signed)
Pt reported to Registration that he just heard from his MD and he was directed to come to the office.  Pt returned labels and left.

## 2015-02-10 NOTE — Telephone Encounter (Signed)
PLEASE NOTE: All timestamps contained within this report are represented as [place] Standard Time. CONFIDENTIALTY NOTICE: This fax transmission is intended only for the addressee. It contains information that is legally privileged, confidential or otherwise protected from use or disclosure. If you are not the intended recipient, you are strictly prohibited from reviewing, disclosing, copying using or disseminating any of this information or taking any action in reliance on or regarding this information. If you have received this fax in error, please notify us immediately by telephone so that we can arrange for its return to [place]. Phone: [phone], Toll-Free: [phone], Fax: [phone] Page: 1 of 2 Call Id: [id number] [facility] Client [place] Patient Name: Randy [name] DOB: Apr 11, 1930 Initial Comment Caller states his Bp is 190/100 Nurse Assessment Nurse: Mechele Dawley, RN, Amy Date/Time (Eastern Time): 02/10/2015 11:42:41 AM Confirm and document reason for call. If symptomatic, describe symptoms. ---CALLER STATES HE TAKES METOPROLOL 50 MG BID. HE TOOK HIS MED AROUND 0830 THIS MORNING. HIS BP NORMALLY RUNS AROUND 130-140S. HE HAS NOT FELT WELL LAST PART OF LAST WEEK. HE STATES THAT WHEN HE WAKES UP IN THE MORNING HE CANT LAY IN BED HE SEEMS TO HAVE SOME PROBLEMS BREATHING. HE IS NOT HAVING ANY SWELLING ANY WHERE. HE HAS BEEN COMPLAINING OF A HEADACHE THAT STARTED - DULL HEADACHE HE STATES STARTED THIS MORNING. 1/10 HE STATES ON PAIN SCALE. NO DISCOMFORT IN CHEST AT ALL. DENIES ANY NUMBNESS OR TINGLING. WHEN SEEN IN THE OFFICE IN DECEMBER 118/68 AND PULSE WAS 49. Has the patient traveled out of the country within the last 30 days? ---Not Applicable Does the patient require triage? ---Yes Related visit to physician within the last 2 weeks? ---No Does the PT have any chronic conditions? (i.e. diabetes, asthma,  etc.) ---Yes List chronic conditions. ---HTN Guidelines Guideline Title Affirmed Question Affirmed Notes Headache Headache (all triage questions negative) High Blood Pressure [1] BP # 160 / 100 AND [2] cardiac or neurologic symptoms (e.g., chest pain, difficulty breathing, unsteady gait, blurred vision) 180/100 EARLIER THS MORNIG 182/94 DURING TRIAGE CALL Final Disposition User Go to ED Now [place], Therapist, sports, Amy Comments PATIENT HAS A NIECE THAT WORKS AT [place] ARE 20 PEOPLE WAITING IN LINE AND HE DOES NOT WANT TO [place]. HE WAS GOING TO GO INTO THE URGENT CARE AT [facility] AND THEN HE DECIDED TO WAIT TO SEE IF NURSE WOULD CALL HIM BACK FROM THE OFFICE AND SEE IF THEY COULD WORK HIM IN TO BE SEEN FROM DR. KOLLAR. TRIAGE NURSE TRIED TO GET HIM AN APPOINTMENT TODAY WITH ANYONE PLEASE NOTE: All timestamps contained within this report are represented as [place] Standard Time. CONFIDENTIALTY NOTICE: This fax transmission is intended only for the addressee. It contains information that is legally privileged, confidential or otherwise protected from use or disclosure. If you are not the intended recipient, you are strictly prohibited from reviewing, disclosing, copying using or disseminating any of this information or taking any action in reliance on or regarding this information. If you have received this fax in error, please notify us immediately by telephone so that we can arrange for its return to [place]. Phone: [phone], Toll-Free: [phone], Fax: [phone] Page: 2 of 2 Call Id: [id number] Comments AT THE [facility] SITE AND NO ONE HAD ANY OPENINGS. WILL SEND THIS RECORD OVER PER THE PROTOCOL FOR THE NURSE. BP READINGS WERE RECHECKED DURING TRIAGE OF 182/94, PULSE 68. HE  WANTED TO JUST TAKE ANOTHER METOPROLOL BUT IT HAD JUST BEEN ALMOST 4 HOURS SINCE THE LAST DOSE AT 0830.

## 2015-02-10 NOTE — Telephone Encounter (Signed)
Scheduled patient for 0845 on 4/5 with dr Doug Sou and advised pt to go to ER if symptoms worsened thru night

## 2015-02-10 NOTE — ED Notes (Signed)
MD (Oni) at bedside. 

## 2015-02-10 NOTE — ED Provider Notes (Signed)
CSN: [id number]     Arrival date & time 02/10/15  2019 History   First MD Initiated Contact with Patient 02/10/15 2259     This chart was scribed for Randy Balls, MD by Randy [name], ED Scribe. This patient was seen in room B14C/B14C and the patient's care was started 11:01 PM.   Chief Complaint  Patient presents with  . Chest Pain   The history is provided by the patient. No language interpreter was used.    HPI Comments: Randy [name] is a 81 y.o. male with a PMHx of hyperlipemia, HTN, CAD, cancer, and acute MI who presents to the Emergency Department complaining of constant, ongoing HA x 3 days. This morning pt felt his blood pressure may have been high and had his pressure checked at the fire department. Pt states blood pressure read 190/100, however, pressure decreased over the course of the day. Randy [name] contacted his doctor and was advised to come to the emergency department for further evaluation. No blurry vision, chest pain, fever, leg swelling, cough, diaphoresis, or CP. No muscle weakness, numbness or tingling. Pt is currently on Lopressor 50 mg. he has history of cardiac MI but states this does not feel similar. Follow up appointment with PCP tomorrow 4/5 at 8:45 AM. Pt with known allergy to NSAIDS.  Past Medical History  Diagnosis Date  . Hyperlipidemia   . Hypertension   . Coronary artery disease   . Cancer   . Acute MI    History reviewed. No pertinent past surgical history. No family history on file. History  Substance Use Topics  . Smoking status: Never Smoker   . Smokeless tobacco: Never Used  . Alcohol Use: No    Review of Systems  Constitutional: Negative for fever, chills and diaphoresis.  Eyes: Negative for visual disturbance.  Respiratory: Negative for cough and shortness of breath.   Cardiovascular: Negative for chest pain and leg swelling.  Neurological: Positive for headaches. Negative for weakness and numbness.  All other systems reviewed and are  negative.     Allergies  Nsaids  Home Medications   Prior to Admission medications   Medication Sig Start Date End Date Taking? Authorizing Provider  aspirin 325 MG EC tablet Take 325 mg by mouth every morning.    Historical Provider, MD  Calcium Carbonate-Vitamin D (CALCIUM + D PO) Take 1 tablet by mouth every evening.    Historical Provider, MD  fish oil-omega-3 fatty acids 1000 MG capsule Take 2 g by mouth 2 (two) times daily.    Historical Provider, MD  fluticasone (FLONASE) 50 MCG/ACT nasal spray Place 2 sprays into both nostrils daily. 10/17/14   Olga Millers, MD  metoprolol (LOPRESSOR) 50 MG tablet Take one by mouth in the morning and one half at night. 07/04/14   Dorena Cookey, MD  niacin (NIASPAN) 500 MG CR tablet Take 500 mg by mouth at bedtime.    Historical Provider, MD  nitroGLYCERIN (NITROSTAT) 0.4 MG SL tablet Place 1 tablet (0.4 mg total) under the tongue every 5 (five) minutes as needed for chest pain. 10/11/13   Dorena Cookey, MD  simvastatin (ZOCOR) 20 MG tablet Take 1 tablet (20 mg total) by mouth at bedtime. 01/07/15   Olga Millers, MD  zolpidem (AMBIEN) 5 MG tablet TAKE 1 TABLET BY MOUTH AT BEDTIME *MAX 90 DAY SUPPLY/YEAR. INS WON'T COVER 01/28/15   Olga Millers, MD   Triage Vitals: BP 160/78 mmHg  Pulse 63  Temp(Src) 97.5 F (36.4 C)  Resp 16  Ht 5\' 9"  (1.753 m)  Wt 160 lb (72.576 kg)  BMI 23.62 kg/m2  SpO2 100%   Physical Exam  Constitutional: He is oriented to person, place, and time. He appears well-developed and well-nourished.  HENT:  Head: Normocephalic and atraumatic.  Eyes: EOM are normal.  Neck: Normal range of motion.  Cardiovascular: Normal rate, regular rhythm, normal heart sounds and intact distal pulses.   Pulmonary/Chest: Effort normal and breath sounds normal. No respiratory distress.  Abdominal: Soft. He exhibits no distension. There is no tenderness.  Musculoskeletal: Normal range of motion.  Trace edema bilaterally   Neurological: He is alert and oriented to person, place, and time. No cranial nerve deficit. He exhibits normal muscle tone.   Normal strength and sensation 4 extremities, normal cerebellar testing.  Skin: Skin is warm and dry.  Psychiatric: He has a normal mood and affect. Judgment normal.  Nursing note and vitals reviewed.   ED Course  Procedures (including critical care time)  DIAGNOSTIC STUDIES: Oxygen Saturation is 100% on RA, Normal by my interpretation.    COORDINATION OF CARE: 11:01 PM-Discussed treatment plan with pt at bedside and pt agreed to plan.       Labs Review Labs Reviewed  I-STAT CHEM 8, ED - Abnormal; Notable for the following:    Glucose, Bld 126 (*)    All other components within normal limits  CBC WITH DIFFERENTIAL/PLATELET  Randolm Idol, ED    Imaging Review Dg Chest 2 View  02/10/2015   CLINICAL DATA:  Patient complains of being hypertensive. Pain in the left arm.  EXAM: CHEST  2 VIEW  COMPARISON:  04/29/2005  FINDINGS: Stable cardiac and mediastinal contours with tortuosity of the thoracic aorta. No consolidative pulmonary opacities. Possible 9 mm nodular opacity projecting over the right upper lung. Additional possible 8 mm nodular opacity projecting over the left upper lung. No pleural effusion or pneumothorax.  IMPRESSION: Possible bilateral nodular pulmonary opacities. Recommend correlation with chest CT in the non acute setting.  No acute cardiopulmonary process.   Electronically Signed   By: Lovey Newcomer M.D.   On: 02/10/2015 21:28     EKG Interpretation   Date/Time:  Monday February 10 2015 20:23:38 EDT Ventricular Rate:  68 PR Interval:  128 QRS Duration: 80 QT Interval:  414 QTC Calculation: 440 R Axis:   31 Text Interpretation:  Normal sinus rhythm Normal ECG No significant change  since last tracing Confirmed by Glynn Octave [phone]) on 02/10/2015  11:27:55 PM      MDM   Final diagnoses:  None    Patient presents  emergency department for high blood pressure, as high as 409 systolic earlier today. He states he also had a headache in which heis wrong at that time. Currently he is asymptomatic. He is denied any chest pain, shortness of breath or other anginal equivalents. Will evaluate any concern for cardiac risk with 2 sets of troponins and heart score the emergency department. Chest x-ray shows possible bilateral pulmonary opacities haven't patient has no complaints of fever or cough. I doubt multifocal pneumonia. He was made aware of these findings. Patient is advised to follow-up as primary care physician and he has appointment while 8:45 AM to see his doctor.   repeat troponin is negative, heart score is 3. Patient continues to be asymptomatic and blood pressure has resolved back to normal without intervention. At this time his vital signs remain within his normal  limits and he is safe for discharge with primary care follow-up in 8 hours with his primary care physician.  I personally performed the services described in this documentation, which was scribed in my presence. The recorded information has been reviewed and is accurate.   Randy Balls, MD 02/11/15 [phone]

## 2015-02-10 NOTE — ED Notes (Signed)
Pt st's has not felt good in past couple of days.  Pt c/o being hypertensive, also has had some pain in left arm today.

## 2015-02-10 NOTE — ED Notes (Signed)
Patient came up to desk and stated he needed to see someone sooner and wants to leave.

## 2015-02-11 ENCOUNTER — Ambulatory Visit (INDEPENDENT_AMBULATORY_CARE_PROVIDER_SITE_OTHER): Payer: Medicare Other | Admitting: Internal Medicine

## 2015-02-11 ENCOUNTER — Encounter: Payer: Self-pay | Admitting: Internal Medicine

## 2015-02-11 ENCOUNTER — Telehealth: Payer: Self-pay | Admitting: *Deleted

## 2015-02-11 VITALS — BP 130/62 | HR 56 | Temp 97.5°F | Resp 16 | Wt 163.0 lb

## 2015-02-11 DIAGNOSIS — R938 Abnormal findings on diagnostic imaging of other specified body structures: Secondary | ICD-10-CM

## 2015-02-11 DIAGNOSIS — I10 Essential (primary) hypertension: Secondary | ICD-10-CM

## 2015-02-11 DIAGNOSIS — R9389 Abnormal findings on diagnostic imaging of other specified body structures: Secondary | ICD-10-CM

## 2015-02-11 LAB — I-STAT TROPONIN, ED: Troponin i, poc: 0 ng/mL (ref 0.00–0.08)

## 2015-02-11 NOTE — Patient Instructions (Signed)
We will check the CT of the chest to make sure that those things on the chest x-ray are normal lung and not anything else.   Keep taking the metoprolol 1 pill in the morning and 1 pill at night.   Call us back if the blood pressure goes up high again.

## 2015-02-11 NOTE — Discharge Instructions (Signed)
How to Take Your Blood Pressure Randy [name], You heart evaluation today was normal.  Your chest xray shows nodules on both sides which needs repeat testing in 6 months.  Follow up with your primary care doctor during your appointment tomorrow morning to manage your blood pressure medicines.  If any symptoms worsen, come back to the ED immediately. Thank you. HOW DO I GET A BLOOD PRESSURE MACHINE?  You can buy an electronic home blood pressure machine at your local pharmacy. Insurance will sometimes cover the cost if you have a prescription.  Ask your doctor what type of machine is best for you. There are different machines for your arm and your wrist.  If you decide to buy a machine to check your blood pressure on your arm, first check the size of your arm so you can buy the right size cuff. To check the size of your arm:   Use a measuring tape that shows both inches and centimeters.   Wrap the measuring tape around the upper-middle part of your arm. You may need someone to help you measure.   Write down your arm measurement in both inches and centimeters.   To measure your blood pressure correctly, it is important to have the right size cuff.   If your arm is up to 13 inches (up to 34 centimeters), get an adult cuff size.  If your arm is 13 to 17 inches (35 to 44 centimeters), get a large adult cuff size.    If your arm is 17 to 20 inches (45 to 52 centimeters), get an adult thigh cuff.  WHAT DO THE NUMBERS MEAN?   There are two numbers that make up your blood pressure. For example: 120/80.  The first number (120 in our example) is called the "systolic pressure." It is a measure of the pressure in your blood vessels when your heart is pumping blood.  The second number (80 in our example) is called the "diastolic pressure." It is a measure of the pressure in your blood vessels when your heart is resting between beats.  Your doctor will tell you what your blood pressure should  be. WHAT SHOULD I DO BEFORE I CHECK MY BLOOD PRESSURE?   Try to rest or relax for at least 30 minutes before you check your blood pressure.  Do not smoke.  Do not have any drinks with caffeine, such as:  Soda.  Coffee.  Tea.  Check your blood pressure in a quiet room.  Sit down and stretch out your arm on a table. Keep your arm at about the level of your heart. Let your arm relax.  Make sure that your legs are not crossed. HOW DO I CHECK MY BLOOD PRESSURE?  Follow the directions that came with your machine.  Make sure you remove any tight-fighting clothing from your arm or wrist. Wrap the cuff around your upper arm or wrist. You should be able to fit a finger between the cuff and your arm. If you cannot fit a finger between the cuff and your arm, it is too tight and should be removed and rewrapped.  Some units require you to manually pump up the arm cuff.  Automatic units inflate the cuff when you press a button.  Cuff deflation is automatic in both models.  After the cuff is inflated, the unit measures your blood pressure and pulse. The readings are shown on a monitor. Hold still and breathe normally while the cuff is inflated.  Getting a  reading takes less than a minute.  Some models store readings in a memory. Some provide a printout of readings. If your machine does not store your readings, keep a written record.  Take readings with you to your next visit with your doctor. Document Released: 10/07/2008 Document Revised: 03/11/2014 Document Reviewed: 12/20/2013 [name] Patient Information 2015 [place], [place]. This information is not intended to replace advice given to you by your health care provider. Make sure you discuss any questions you have with your health care provider.

## 2015-02-11 NOTE — ED Notes (Signed)
MD (Oni) at bedside. 

## 2015-02-11 NOTE — Telephone Encounter (Signed)
[place] [facility] - Client [facility] Patient Name: Randy [name] Gender: Male DOB: July 25, 1930 Age: 81 Y 11 M 12 D Return Phone Number: [phone] (Primary) Address: [place]/State/Zip: [place] [address] Client [place] [facility] - Client Client Site [place] - Day Physician [place], [place] Type Call Call Type Triage / Clinical Relationship To Patient Self Appointment Disposition EMR Appointment Attempted - Not Scheduled Info pasted into Epic Yes Return Phone Number [phone] (Primary) Chief Complaint Blood Pressure High Initial Comment Caller states his Bp is 190/100 PreDisposition Did not know what to do Nurse Assessment Nurse: Mechele Dawley, RN, Amy Date/Time (Eastern Time): 02/10/2015 11:42:41 AM Confirm and document reason for call. If symptomatic, describe symptoms. ---CALLER STATES HE TAKES METOPROLOL 50 MG BID. HE TOOK HIS MED AROUND 0830 THIS MORNING. HIS BP NORMALLY RUNS AROUND 130-140'S. HE HAS NOT FELT WELL LAST PART OF LAST WEEK. HE STATES THAT WHEN HE WAKES UP IN THE MORNING HE CAN'T LAY IN BED HE SEEMS TO HAVE SOME PROBLEMS BREATHING. HE IS NOT HAVING ANY SWELLING ANY WHERE. HE HAS BEEN COMPLAINING OF A HEADACHE THAT STARTED - DULL HEADACHE HE STATES STARTED THIS MORNING. 1/10 HE STATES ON PAIN SCALE. NO DISCOMFORT IN CHEST AT ALL. DENIES ANY NUMBNESS OR TINGLING. WHEN SEEN IN THE OFFICE IN DECEMBER 118/68 AND PULSE WAS 8. Has the patient traveled out of the country within the last 30 days? ---Not Applicable Does the patient require triage? ---Yes Related visit to physician within the last 2 weeks? ---No Does the PT have any chronic conditions? (i.e. diabetes, asthma, etc.) ---Yes List chronic conditions. ---HTN Guidelines Guideline Title Affirmed Question Affirmed Notes Nurse Date/Time (Eastern Time) Headache Headache (all triage questions  negative) [place], RN, Amy 02/10/2015 11:48:14 AM High Blood Pressure [1] BP # 160 / 100 AND [2] cardiac or neurologic 180/100 [facility] [place] 182/94 [place], RN, Amy 02/10/2015 11:52:38 AM PLEASE NOTE: All timestamps contained within this report are represented as [place] Standard Time. CONFIDENTIALTY NOTICE: This fax transmission is intended only for the addressee. It contains information that is legally privileged, confidential or otherwise protected from use or disclosure. If you are not the intended recipient, you are strictly prohibited from reviewing, disclosing, copying using or disseminating any of this information or taking any action in reliance on or regarding this information. If you have received this fax in error, please notify us immediately by telephone so that we can arrange for its return to [place]. Phone: [phone], Toll-Free: [phone], Fax: [phone] Page: 2 of 2 Call Id: [id number] Guidelines Guideline Title Affirmed Question Affirmed Notes Nurse Date/Time Eilene Ghazi Time) symptoms (e.g., chest pain, difficulty breathing, unsteady gait, blurred vision) DURING TRIAGE CALL Disp. Time Eilene Ghazi Time) Disposition Final User 02/10/2015 11:52:07 [place], [place], Amy 02/10/2015 12:09:40 PM Go to ED Now Yes Mechele Dawley, RN, Amy Caller Understands: Yes Disagree/Comply: Comply Caller Understands: Yes Disagree/Comply: Disagree Disagree/Comply Reason: Wait and see Care Advice Given Per Guideline HOME CARE: You should be able to treat this at home. REST: Lie down in a dark quiet place and relax until feeling better. CALL BACK IF: CARE ADVICE given per Headache (Adult) guideline. GO TO ED NOW: You need to be seen in the Emergency Department. Go to the ER at ___________ [place] now. Drive carefully. CARE ADVICE given per High Blood Pressure (Adult) guideline. 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 HE WAS GOING TO GO INTO THE URGENT CARE AT [facility] AND THEN HE DECIDED TO WAIT TO SEE IF NURSE WOULD CALL HIM BACK FROM THE OFFICE AND SEE IF THEY COULD WORK HIM IN TO BE SEEN FROM DR. KOLLAR. TRIAGE NURSE TRIED TO GET HIM AN APPOINTMENT TODAY WITH ANYONE AT THE [facility] AND NO ONE HAD ANY OPENINGS. WILL SEND THIS RECORD OVER PER THE PROTOCOL FOR THE NURSE. BP READINGS WERE RECHECKED DURING TRIAGE OF 182/94, PULSE 68. HE WANTED TO JUST TAKE ANOTHER METOPROLOL BUT IT HAD JUST BEEN ALMOST 4 HOURS SINCE THE LAST DOSE AT 0830. Referrals GO TO FACILITY UNDECIDED

## 2015-02-11 NOTE — Progress Notes (Signed)
   Subjective:    Patient ID: Randy [name], male    DOB: 11/15/1929, 81 y.o.   MRN: [id number]  HPI The patient is an 81 YO man coming in for ER follow up. He was seen yesterday with hypertension which resolved on its own (checked several sets of cardiac enzymes which were negative). He had headache yesterday morning and BP 190/100. Later in the day with only his usual blood pressure medicine was down to 160/90 and then even further in the ED. He did have chest x-ray with several nodules in the ER which needs to be followed up with CT chest. He denies cough or cold symptoms. Denies taking any OTC medications the last few days. Feeling better today and blood pressure back to normal. He checks it most every day. Denies headache, chest pains.   Review of Systems  Constitutional: Negative for fever, activity change, appetite change, fatigue and unexpected weight change.  Respiratory: Negative for cough, chest tightness, shortness of breath and wheezing.   Cardiovascular: Negative for chest pain, palpitations and leg swelling.  Gastrointestinal: Negative for nausea, abdominal pain, diarrhea, constipation and abdominal distention.  Musculoskeletal: Negative.   Skin: Negative.   Neurological: Negative.  Negative for headaches.     CXR from 11/2004 reviewed with patient during this visit without the lung nodules seen on CXR from 02/10/15 which was also reviewed manually with the patient today. Nodule in the right lung appear to be round and new from 2006, left lung area may be more likely to be normal lung markings. No signs of pneumonia on either imaging.     Objective:   Physical Exam  Constitutional: He is oriented to person, place, and time. He appears well-developed and well-nourished.  HENT:  Head: Normocephalic and atraumatic.  Eyes: EOM are normal.  Neck: Normal range of motion.  Cardiovascular: Normal rate and regular rhythm.   Pulmonary/Chest: Effort normal and breath sounds normal. No  respiratory distress. He has no wheezes. He has no rales.  Abdominal: Soft. Bowel sounds are normal. He exhibits no distension. There is no tenderness. There is no rebound.  Musculoskeletal: Normal range of motion.  Neurological: He is alert and oriented to person, place, and time. Coordination normal.  Skin: Skin is warm and dry.   Filed Vitals:   02/11/15 0851  BP: 130/62  Pulse: 56  Temp: 97.5 F (36.4 C)  TempSrc: Oral  Resp: 16  Weight: 163 lb (73.936 kg)  SpO2: 97%      Assessment & Plan:

## 2015-02-11 NOTE — Progress Notes (Signed)
Pre visit review using our clinic review tool, if applicable. No additional management support is needed unless otherwise documented below in the visit note. 

## 2015-02-11 NOTE — Assessment & Plan Note (Signed)
Ordered CT chest with contrast to evaluate. He does have a history of prostate cancer. Non-smoker. Reviewed manually old CXR from2006 and these areas were not present on that imaging. Last creatinine 1.16 and able to have contrast.

## 2015-02-11 NOTE — Assessment & Plan Note (Signed)
Was moderately exacerbated yesterday for no good reason. BP normal today and will continue metoprolol 50 mg BID. He will continue with close monitoring at home and if elevated again will add second BP agent. He is maxed out of the metoprolol as his HR today 56. No need for blood work today.

## 2015-02-27 ENCOUNTER — Other Ambulatory Visit: Payer: Self-pay | Admitting: Internal Medicine

## 2015-02-27 DIAGNOSIS — R9389 Abnormal findings on diagnostic imaging of other specified body structures: Secondary | ICD-10-CM

## 2015-02-28 ENCOUNTER — Other Ambulatory Visit (INDEPENDENT_AMBULATORY_CARE_PROVIDER_SITE_OTHER): Payer: Medicare Other

## 2015-02-28 DIAGNOSIS — R938 Abnormal findings on diagnostic imaging of other specified body structures: Secondary | ICD-10-CM

## 2015-02-28 DIAGNOSIS — R9389 Abnormal findings on diagnostic imaging of other specified body structures: Secondary | ICD-10-CM

## 2015-02-28 LAB — BASIC METABOLIC PANEL
BUN: 23 mg/dL (ref 6–23)
CALCIUM: 9.1 mg/dL (ref 8.4–10.5)
CO2: 28 mEq/L (ref 19–32)
Chloride: 106 mEq/L (ref 96–112)
Creatinine, Ser: 1.91 mg/dL — ABNORMAL HIGH (ref 0.40–1.50)
GFR: 35.94 mL/min — AB (ref >60.00)
GLUCOSE: 92 mg/dL (ref 70–99)
Potassium: 5.1 mEq/L (ref 3.5–5.1)
Sodium: 139 mEq/L (ref 135–145)

## 2015-03-03 ENCOUNTER — Telehealth: Payer: Self-pay | Admitting: Internal Medicine

## 2015-03-03 NOTE — Telephone Encounter (Signed)
Elevated cre, wanted to change to a with contrast to a with out.

## 2015-03-04 NOTE — Telephone Encounter (Signed)
Please schedule and acute visit, thanks.

## 2015-03-04 NOTE — Telephone Encounter (Signed)
Would like him to come in for acute visit with someone this week about his labs and for repeat. Can hold on the CT for now.

## 2015-03-05 ENCOUNTER — Inpatient Hospital Stay: Admission: RE | Admit: 2015-03-05 | Payer: Medicare Other | Source: Ambulatory Visit

## 2015-03-06 ENCOUNTER — Encounter: Payer: Self-pay | Admitting: Internal Medicine

## 2015-03-06 ENCOUNTER — Ambulatory Visit (INDEPENDENT_AMBULATORY_CARE_PROVIDER_SITE_OTHER): Payer: Medicare Other | Admitting: Internal Medicine

## 2015-03-06 ENCOUNTER — Other Ambulatory Visit (INDEPENDENT_AMBULATORY_CARE_PROVIDER_SITE_OTHER): Payer: Medicare Other

## 2015-03-06 VITALS — BP 108/62 | HR 54 | Temp 97.7°F | Resp 16 | Ht 69.0 in | Wt 160.8 lb

## 2015-03-06 DIAGNOSIS — I10 Essential (primary) hypertension: Secondary | ICD-10-CM

## 2015-03-06 DIAGNOSIS — N179 Acute kidney failure, unspecified: Secondary | ICD-10-CM | POA: Diagnosis not present

## 2015-03-06 DIAGNOSIS — C61 Malignant neoplasm of prostate: Secondary | ICD-10-CM | POA: Diagnosis not present

## 2015-03-06 LAB — BASIC METABOLIC PANEL
BUN: 31 mg/dL — AB (ref 6–23)
CO2: 28 mEq/L (ref 19–32)
CREATININE: 1.11 mg/dL (ref 0.40–1.50)
Calcium: 9.8 mg/dL (ref 8.4–10.5)
Chloride: 104 mEq/L (ref 96–112)
GFR: 67.24 mL/min (ref >60.00)
Glucose, Bld: 93 mg/dL (ref 70–99)
Potassium: 4.9 mEq/L (ref 3.5–5.1)
Sodium: 138 mEq/L (ref 135–145)

## 2015-03-06 MED ORDER — FLUTICASONE PROPIONATE 50 MCG/ACT NA SUSP: 2.0000 | Freq: Every day | NASAL | Status: DC

## 2015-03-06 NOTE — Patient Instructions (Signed)
We will recheck the kidney test and call you back with the results.

## 2015-03-06 NOTE — Progress Notes (Signed)
Pre visit review using our clinic review tool, if applicable. No additional management support is needed unless otherwise documented below in the visit note. 

## 2015-03-09 DIAGNOSIS — N179 Acute kidney failure, unspecified: Secondary | ICD-10-CM | POA: Insufficient documentation

## 2015-03-09 NOTE — Assessment & Plan Note (Signed)
Most likely explanation is lab error. Not on any medications that could impair the kidneys no recent dehydration or GI complaints. No sickness and no NSAIDS. Recheck BMP today.

## 2015-03-09 NOTE — Progress Notes (Signed)
   Subjective:    Patient ID: Randy [name], male    DOB: [birth year], 81 y.o.   MRN: [id number]  HPI The patient is an 81 YO man who is coming in for elevated kidney function. He was getting labs done to check out a lung nodule on CT. No change in health and no dehydration. He has been eating and drinking usually. No sickness, denies diarrhea or constipation. Denies urinary symptoms. Not on any new medications OTC or prescription.   Review of Systems  Constitutional: Negative for fever, activity change, appetite change, fatigue and unexpected weight change.  Respiratory: Negative for cough, chest tightness, shortness of breath and wheezing.   Cardiovascular: Negative for chest pain, palpitations and leg swelling.  Gastrointestinal: Negative for nausea, abdominal pain, diarrhea, constipation and abdominal distention.  Musculoskeletal: Negative.   Skin: Negative.   Neurological: Negative.  Negative for headaches.      Objective:   Physical Exam  Constitutional: He is oriented to person, place, and time. He appears well-developed and well-nourished.  HENT:  Head: Normocephalic and atraumatic.  Eyes: EOM are normal.  Neck: Normal range of motion.  Cardiovascular: Normal rate and regular rhythm.   Pulmonary/Chest: Effort normal and breath sounds normal. No respiratory distress. He has no wheezes. He has no rales.  Abdominal: Soft. Bowel sounds are normal. He exhibits no distension. There is no tenderness. There is no rebound.  Musculoskeletal: Normal range of motion.  Neurological: He is alert and oriented to person, place, and time. Coordination normal.  Skin: Skin is warm and dry.   Filed Vitals:   03/06/15 1517  BP: 108/62  Pulse: 54  Temp: 97.7 F (36.5 C)  TempSrc: Oral  Resp: 16  Height: 5\' 9"  (1.753 m)  Weight: 160 lb 12.8 oz (72.938 kg)  SpO2: 97%      Assessment & Plan:

## 2015-03-12 ENCOUNTER — Ambulatory Visit (INDEPENDENT_AMBULATORY_CARE_PROVIDER_SITE_OTHER)
Admission: RE | Admit: 2015-03-12 | Discharge: 2015-03-12 | Disposition: A | Discharge status: Home / Self Care | Payer: Medicare Other | Source: Ambulatory Visit | Attending: Internal Medicine | Admitting: Internal Medicine

## 2015-03-12 DIAGNOSIS — R938 Abnormal findings on diagnostic imaging of other specified body structures: Secondary | ICD-10-CM | POA: Diagnosis not present

## 2015-03-12 DIAGNOSIS — Z8546 Personal history of malignant neoplasm of prostate: Secondary | ICD-10-CM | POA: Diagnosis not present

## 2015-03-12 DIAGNOSIS — R918 Other nonspecific abnormal finding of lung field: Secondary | ICD-10-CM | POA: Diagnosis not present

## 2015-03-12 DIAGNOSIS — E278 Other specified disorders of adrenal gland: Secondary | ICD-10-CM | POA: Diagnosis not present

## 2015-03-12 DIAGNOSIS — R9389 Abnormal findings on diagnostic imaging of other specified body structures: Secondary | ICD-10-CM

## 2015-03-12 MED ORDER — IOHEXOL 300 MG/ML  SOLN
80.0000 mL | Freq: Once | INTRAMUSCULAR | Status: AC | PRN
  Administered 2015-03-12: 80 mL via INTRAVENOUS

## 2015-03-14 DIAGNOSIS — C61 Malignant neoplasm of prostate: Secondary | ICD-10-CM | POA: Diagnosis not present

## 2015-03-17 ENCOUNTER — Other Ambulatory Visit (HOSPITAL_COMMUNITY): Payer: Self-pay | Admitting: Urology

## 2015-03-17 DIAGNOSIS — C61 Malignant neoplasm of prostate: Secondary | ICD-10-CM

## 2015-03-27 ENCOUNTER — Encounter (HOSPITAL_COMMUNITY)
Admission: RE | Admit: 2015-03-27 | Discharge: 2015-03-27 | Disposition: A | Discharge status: Home / Self Care | Payer: Medicare Other | Source: Ambulatory Visit | Attending: Urology | Admitting: Urology

## 2015-03-27 DIAGNOSIS — C61 Malignant neoplasm of prostate: Secondary | ICD-10-CM | POA: Diagnosis not present

## 2015-03-27 MED ORDER — TECHNETIUM TC 99M MEDRONATE IV KIT
26.6000 | PACK | Freq: Once | INTRAVENOUS | Status: AC | PRN
  Administered 2015-03-27: 26.6 via INTRAVENOUS

## 2015-04-18 ENCOUNTER — Ambulatory Visit: Payer: Medicare Other | Admitting: Internal Medicine

## 2015-05-21 DIAGNOSIS — C61 Malignant neoplasm of prostate: Secondary | ICD-10-CM | POA: Diagnosis not present

## 2015-05-23 ENCOUNTER — Other Ambulatory Visit: Payer: Self-pay | Admitting: Family Medicine

## 2015-06-18 ENCOUNTER — Encounter: Payer: Self-pay | Admitting: Internal Medicine

## 2015-06-18 ENCOUNTER — Ambulatory Visit (INDEPENDENT_AMBULATORY_CARE_PROVIDER_SITE_OTHER): Payer: Medicare Other | Admitting: Internal Medicine

## 2015-06-18 ENCOUNTER — Other Ambulatory Visit (INDEPENDENT_AMBULATORY_CARE_PROVIDER_SITE_OTHER): Payer: Medicare Other

## 2015-06-18 VITALS — BP 128/66 | HR 59 | Temp 97.5°F | Resp 16 | Wt 160.0 lb

## 2015-06-18 DIAGNOSIS — R609 Edema, unspecified: Secondary | ICD-10-CM

## 2015-06-18 DIAGNOSIS — Z23 Encounter for immunization: Secondary | ICD-10-CM | POA: Diagnosis not present

## 2015-06-18 DIAGNOSIS — Z87448 Personal history of other diseases of urinary system: Secondary | ICD-10-CM

## 2015-06-18 DIAGNOSIS — E785 Hyperlipidemia, unspecified: Secondary | ICD-10-CM

## 2015-06-18 DIAGNOSIS — F4322 Adjustment disorder with anxiety: Secondary | ICD-10-CM

## 2015-06-18 DIAGNOSIS — G479 Sleep disorder, unspecified: Secondary | ICD-10-CM | POA: Diagnosis not present

## 2015-06-18 LAB — HEPATIC FUNCTION PANEL
ALT: 10 U/L (ref 0–53)
AST: 15 U/L (ref 0–37)
Albumin: 4.4 g/dL (ref 3.5–5.2)
Alkaline Phosphatase: 52 U/L (ref 39–117)
BILIRUBIN DIRECT: 0.1 mg/dL (ref 0.0–0.3)
BILIRUBIN TOTAL: 0.6 mg/dL (ref 0.2–1.2)
Total Protein: 6.9 g/dL (ref 6.0–8.3)

## 2015-06-18 LAB — BASIC METABOLIC PANEL
BUN: 22 mg/dL (ref 6–23)
CHLORIDE: 106 meq/L (ref 96–112)
CO2: 27 meq/L (ref 19–32)
CREATININE: 0.95 mg/dL (ref 0.40–1.50)
Calcium: 9.6 mg/dL (ref 8.4–10.5)
GFR: 80.41 mL/min (ref >60.00)
GLUCOSE: 128 mg/dL — AB (ref 70–99)
Potassium: 4.4 mEq/L (ref 3.5–5.1)
Sodium: 142 mEq/L (ref 135–145)

## 2015-06-18 LAB — TSH: TSH: 1.26 u[IU]/mL (ref 0.35–4.50)

## 2015-06-18 MED ORDER — SERTRALINE HCL 25 MG PO TABS: 25.0000 mg | ORAL_TABLET | Freq: Every day | ORAL | Status: DC

## 2015-06-18 NOTE — Progress Notes (Signed)
   Subjective:    Patient ID: Randy [name], male    DOB: 1930-10-28, 81 y.o.   MRN: [id number]  HPI He describes increased edema for at least the last week .There is no specific trigger such as increased sodium intake or prolonged travel. He did injure his great toe last week but the swelling is bilateral . One month ago a drug used to treat his prostate cancer was discontinued; he questions a relationship to onset of edema.  On 02/28/15 his creatinine was 1.91 and GFR 35.94. Within 4 days the creatinine was 1.11 and GFR 67.94.  He describes urgency if he has been seated at his computer for prolonged periods of time. He has no other genitourinary symptoms. He is not on amlodipine. He is on a statin.  He's concerned about his severe claustrophobia and increased anxiety recently. He states that the has difficulty returning to sleep when he wakes up early in the morning. He has been taking Ambien.  Review of Systems  There is no significant cough, sputum production,hemoptysis, wheezing,or  paroxysmal nocturnal dyspnea. Unexplained weight loss, abdominal pain, significant dyspepsia, dysphagia, melena, rectal bleeding, or persistently small caliber stools are not present. Dysuria, pyuria, hematuria, frequency, nocturia or polyuria are denied.    Objective:   Physical Exam  Pertinent or positive findings include: Appears younger than his stated age. Pattern alopecia is present. He has a mustache. Ptosis is present on the right. There is a small eschar 6 x 5 mm over the right cheek. Heart rate is slow. The second heart sound is increased. No NVD @ 10 degrees. Posterior tibial pulses are decreased. He has 1+ pitting edema.  General appearance :adequately nourished; in no distress.  Eyes: No conjunctival inflammation or scleral icterus is present.  Oral exam:  Lips and gums are healthy appearing.There is no oropharyngeal erythema or exudate noted. Dental hygiene is good.  Heart:  Normal rate  and regular rhythm. S1 and S2 normal without gallop, murmur, click, rub or other extra sounds    Lungs:Chest clear to auscultation; no wheezes, rhonchi,rales ,or rubs present.No increased work of breathing.   Abdomen: bowel sounds normal, soft and non-tender without masses, organomegaly or hernias noted.  No guarding or rebound. No HJR.  Vascular : all pulses equal ; no bruits present.  Skin:Warm & dry.  Intact without suspicious lesions or rashes ; no tenting or jaundice   Lymphatic: No lymphadenopathy is noted about the head, neck, axilla   Neuro: Strength, tone & DTRs normal.         Assessment & Plan:  #1 edema  #2 history of renal insufficiency, transitory  #3 sleep disorder  #4 anxiety  See orders

## 2015-06-18 NOTE — Patient Instructions (Addendum)
Avoid ingestion of  excess salt/sodium.Cook with pepper & other spices . Use the salt substitute  Mrs Deliah [employer] products to season food @ the table. Avoid foods which taste salty or "vinegary" as their sodium content will be high.  To prevent sleep dysfunction follow these instructions for sleep hygiene. Do not read, watch TV, or eat in bed. Do not get into bed until you are ready to turn off the light &  to go to sleep. Do not ingest stimulants ( decongestants, diet pills, nicotine, caffeine) after the evening meal.Do not take daytime naps.Cardiovascular exercise, this can be as simple a program as walking, is recommended 30-45 minutes 3-4 times per week. If you're not exercising you should take 6-8 weeks to build up to this level.

## 2015-06-18 NOTE — Progress Notes (Signed)
Pre visit review using our clinic review tool, if applicable. No additional management support is needed unless otherwise documented below in the visit note. 

## 2015-06-19 ENCOUNTER — Other Ambulatory Visit: Payer: Self-pay | Admitting: Internal Medicine

## 2015-06-19 DIAGNOSIS — R609 Edema, unspecified: Secondary | ICD-10-CM

## 2015-06-19 MED ORDER — FUROSEMIDE 20 MG PO TABS: ORAL_TABLET | ORAL | Status: DC

## 2015-07-02 ENCOUNTER — Other Ambulatory Visit: Payer: Self-pay | Admitting: Internal Medicine

## 2015-07-03 NOTE — Telephone Encounter (Signed)
Faxed script [facility] to [facility].../lmb 

## 2015-07-22 DIAGNOSIS — C61 Malignant neoplasm of prostate: Secondary | ICD-10-CM | POA: Diagnosis not present

## 2015-07-28 DIAGNOSIS — C61 Malignant neoplasm of prostate: Secondary | ICD-10-CM | POA: Diagnosis not present

## 2015-08-06 ENCOUNTER — Ambulatory Visit: Payer: Medicare Other

## 2015-09-02 ENCOUNTER — Other Ambulatory Visit: Payer: Self-pay | Admitting: Internal Medicine

## 2015-09-02 ENCOUNTER — Other Ambulatory Visit: Payer: Self-pay | Admitting: Geriatric Medicine

## 2015-09-02 MED ORDER — ZOLPIDEM TARTRATE 5 MG PO TABS: 5.0000 mg | ORAL_TABLET | Freq: Every day | ORAL | Status: DC

## 2015-09-16 ENCOUNTER — Other Ambulatory Visit: Payer: Self-pay | Admitting: Internal Medicine

## 2015-11-24 DIAGNOSIS — C61 Malignant neoplasm of prostate: Secondary | ICD-10-CM | POA: Diagnosis not present

## 2015-12-02 DIAGNOSIS — Z0000 Encounter for general adult medical examination without abnormal findings: Secondary | ICD-10-CM | POA: Diagnosis not present

## 2015-12-02 DIAGNOSIS — C61 Malignant neoplasm of prostate: Secondary | ICD-10-CM | POA: Diagnosis not present

## 2015-12-27 ENCOUNTER — Other Ambulatory Visit: Payer: Self-pay | Admitting: Internal Medicine

## 2016-02-28 ENCOUNTER — Other Ambulatory Visit: Payer: Self-pay | Admitting: Internal Medicine

## 2016-03-05 ENCOUNTER — Other Ambulatory Visit: Payer: Self-pay | Admitting: Internal Medicine

## 2016-03-08 NOTE — Telephone Encounter (Signed)
Faxed script [facility] to [facility].../lmb 

## 2016-03-16 ENCOUNTER — Other Ambulatory Visit: Payer: Self-pay | Admitting: Internal Medicine

## 2016-03-26 DIAGNOSIS — C61 Malignant neoplasm of prostate: Secondary | ICD-10-CM | POA: Diagnosis not present

## 2016-04-23 DIAGNOSIS — C61 Malignant neoplasm of prostate: Secondary | ICD-10-CM | POA: Diagnosis not present

## 2016-07-20 ENCOUNTER — Other Ambulatory Visit: Payer: Self-pay | Admitting: Internal Medicine

## 2016-08-17 DIAGNOSIS — C61 Malignant neoplasm of prostate: Secondary | ICD-10-CM | POA: Diagnosis not present

## 2016-08-23 DIAGNOSIS — C61 Malignant neoplasm of prostate: Secondary | ICD-10-CM | POA: Diagnosis not present

## 2016-08-28 ENCOUNTER — Other Ambulatory Visit: Payer: Self-pay | Admitting: Internal Medicine

## 2016-09-11 ENCOUNTER — Other Ambulatory Visit: Payer: Self-pay | Admitting: Internal Medicine

## 2016-09-16 ENCOUNTER — Other Ambulatory Visit: Payer: Self-pay | Admitting: Internal Medicine

## 2016-09-16 MED ORDER — ZOLPIDEM TARTRATE 5 MG PO TABS: 5.0000 mg | ORAL_TABLET | Freq: Every day | ORAL | 0 refills | Status: DC

## 2016-09-16 NOTE — Telephone Encounter (Signed)
Faxed to pharmacy

## 2016-09-18 ENCOUNTER — Other Ambulatory Visit: Payer: Self-pay | Admitting: Internal Medicine

## 2016-09-27 ENCOUNTER — Other Ambulatory Visit: Payer: Self-pay | Admitting: Internal Medicine

## 2016-10-24 ENCOUNTER — Other Ambulatory Visit: Payer: Self-pay | Admitting: Internal Medicine

## 2016-10-27 ENCOUNTER — Other Ambulatory Visit: Payer: Self-pay | Admitting: Internal Medicine

## 2016-10-27 NOTE — Telephone Encounter (Signed)
Please advise in Dr. [name]'s absence, thanks.  

## 2016-10-27 NOTE — Telephone Encounter (Signed)
rx [facility]ed to [facility] ([facility]

## 2016-10-28 NOTE — Telephone Encounter (Signed)
Faxed to pharmacy

## 2016-11-29 ENCOUNTER — Other Ambulatory Visit: Payer: Self-pay | Admitting: Internal Medicine

## 2016-12-03 ENCOUNTER — Ambulatory Visit: Payer: Medicare Other | Admitting: Internal Medicine

## 2016-12-13 ENCOUNTER — Telehealth: Payer: Self-pay | Admitting: Internal Medicine

## 2016-12-13 NOTE — Telephone Encounter (Signed)
No, has not been seen since 2016.

## 2016-12-13 NOTE — Telephone Encounter (Signed)
Wife left msg on triage stating husband has an appt on 2/12 wanting to see will MD call in enough ambien until his appt. He has not been able to sleep...Randy [name]

## 2016-12-13 NOTE — Telephone Encounter (Signed)
Notified pt w/MD response.../lmb 

## 2016-12-20 ENCOUNTER — Ambulatory Visit: Payer: Medicare Other | Admitting: Internal Medicine

## 2016-12-28 DIAGNOSIS — C61 Malignant neoplasm of prostate: Secondary | ICD-10-CM | POA: Diagnosis not present

## 2017-01-16 ENCOUNTER — Other Ambulatory Visit: Payer: Self-pay | Admitting: Internal Medicine

## 2017-01-19 ENCOUNTER — Telehealth: Payer: Self-pay | Admitting: Internal Medicine

## 2017-01-19 DIAGNOSIS — C61 Malignant neoplasm of prostate: Secondary | ICD-10-CM | POA: Diagnosis not present

## 2017-01-19 MED ORDER — METOPROLOL TARTRATE 50 MG PO TABS: 50.0000 mg | ORAL_TABLET | Freq: Two times a day (BID) | ORAL | 0 refills | Status: DC

## 2017-01-19 NOTE — Telephone Encounter (Signed)
rescheduled

## 2017-01-19 NOTE — Telephone Encounter (Signed)
Sent in 30 day supply.  Patient has not been seen in 2 years, has apt for tomorrow (15 min slot) but has canceled two previous apts. Patient aware that he needs visit to get any more medication.

## 2017-01-19 NOTE — Telephone Encounter (Signed)
Pt called stated he is out of Metoprolol, his heart rate is 90 and he is very concern. Pt has an appt with Dr. Sharlet Salina 01/20/2017, not sure if we can send in some for him today until he comes in for an appt.   [facility]

## 2017-01-19 NOTE — Telephone Encounter (Signed)
Needs 30 minute slot only.

## 2017-01-20 ENCOUNTER — Ambulatory Visit: Payer: Medicare Other | Admitting: Internal Medicine

## 2017-01-27 ENCOUNTER — Encounter: Payer: Self-pay | Admitting: Internal Medicine

## 2017-01-27 ENCOUNTER — Other Ambulatory Visit (INDEPENDENT_AMBULATORY_CARE_PROVIDER_SITE_OTHER): Payer: Medicare Other

## 2017-01-27 ENCOUNTER — Ambulatory Visit (INDEPENDENT_AMBULATORY_CARE_PROVIDER_SITE_OTHER): Payer: Medicare Other | Admitting: Internal Medicine

## 2017-01-27 VITALS — BP 130/60 | HR 59 | Temp 97.7°F | Resp 14 | Ht 69.0 in | Wt 153.0 lb

## 2017-01-27 DIAGNOSIS — G4700 Insomnia, unspecified: Secondary | ICD-10-CM

## 2017-01-27 DIAGNOSIS — E785 Hyperlipidemia, unspecified: Secondary | ICD-10-CM

## 2017-01-27 DIAGNOSIS — Z0000 Encounter for general adult medical examination without abnormal findings: Secondary | ICD-10-CM | POA: Insufficient documentation

## 2017-01-27 DIAGNOSIS — J301 Allergic rhinitis due to pollen: Secondary | ICD-10-CM

## 2017-01-27 DIAGNOSIS — I10 Essential (primary) hypertension: Secondary | ICD-10-CM

## 2017-01-27 LAB — COMPREHENSIVE METABOLIC PANEL
ALT: 9 U/L (ref 0–53)
AST: 16 U/L (ref 0–37)
Albumin: 4.1 g/dL (ref 3.5–5.2)
Alkaline Phosphatase: 60 U/L (ref 39–117)
BILIRUBIN TOTAL: 0.5 mg/dL (ref 0.2–1.2)
BUN: 19 mg/dL (ref 6–23)
CO2: 27 meq/L (ref 19–32)
Calcium: 9.6 mg/dL (ref 8.4–10.5)
Chloride: 106 mEq/L (ref 96–112)
Creatinine, Ser: 0.9 mg/dL (ref 0.40–1.50)
GFR: 85.26 mL/min (ref >60.00)
GLUCOSE: 84 mg/dL (ref 70–99)
POTASSIUM: 4.4 meq/L (ref 3.5–5.1)
Sodium: 141 mEq/L (ref 135–145)
Total Protein: 6.6 g/dL (ref 6.0–8.3)

## 2017-01-27 LAB — LIPID PANEL
Cholesterol: 104 mg/dL (ref 0–200)
HDL: 40.3 mg/dL (ref >39.00)
LDL Cholesterol: 41 mg/dL (ref 0–99)
NONHDL: 63.67
Total CHOL/HDL Ratio: 3
Triglycerides: 112 mg/dL (ref 0.0–149.0)
VLDL: 22.4 mg/dL (ref 0.0–40.0)

## 2017-01-27 LAB — HEMOGLOBIN A1C: HEMOGLOBIN A1C: 5.7 % (ref 4.6–6.5)

## 2017-01-27 LAB — CBC
HCT: 39.5 % (ref 39.0–52.0)
Hemoglobin: 13.3 g/dL (ref 13.0–17.0)
MCHC: 33.7 g/dL (ref 30.0–36.0)
MCV: 88.6 fl (ref 78.0–100.0)
Platelets: 176 10*3/uL (ref 150.0–400.0)
RBC: 4.46 Mil/uL (ref 4.22–5.81)
RDW: 14 % (ref 11.5–15.5)
WBC: 5.5 10*3/uL (ref 4.0–10.5)

## 2017-01-27 MED ORDER — METOPROLOL TARTRATE 50 MG PO TABS: 50.0000 mg | ORAL_TABLET | Freq: Two times a day (BID) | ORAL | 3 refills | Status: DC

## 2017-01-27 MED ORDER — SERTRALINE HCL 25 MG PO TABS: 25.0000 mg | ORAL_TABLET | Freq: Every morning | ORAL | 3 refills | Status: DC

## 2017-01-27 MED ORDER — ZOLPIDEM TARTRATE 10 MG PO TABS: 10.0000 mg | ORAL_TABLET | Freq: Every evening | ORAL | 3 refills | Status: DC | PRN

## 2017-01-27 NOTE — Patient Instructions (Addendum)
We are checking the labs today and will do the refills.    Health Maintenance, Male A healthy lifestyle and preventive care is important for your health and wellness. Ask your health care provider about what schedule of regular examinations is right for you. What should I know about weight and diet?  Eat a Healthy Diet  Eat plenty of vegetables, fruits, whole grains, low-fat dairy products, and lean protein.  Do not eat a lot of foods high in solid fats, added sugars, or salt. Maintain a Healthy Weight  Regular exercise can help you achieve or maintain a healthy weight. You should:  Do at least 150 minutes of exercise each week. The exercise should increase your heart rate and make you sweat (moderate-intensity exercise).  Do strength-training exercises at least twice a week. Watch Your Levels of Cholesterol and Blood Lipids  Have your blood tested for lipids and cholesterol every 5 years starting at 81 years of age. If you are at high risk for heart disease, you should start having your blood tested when you are 81 years old. You may need to have your cholesterol levels checked more often if:  Your lipid or cholesterol levels are high.  You are older than 81 years of age.  You are at high risk for heart disease. What should I know about cancer screening? Many types of cancers can be detected early and may often be prevented. Lung Cancer  You should be screened every year for lung cancer if:  You are a current smoker who has smoked for at least 30 years.  You are a former smoker who has quit within the past 15 years.  Talk to your health care provider about your screening options, when you should start screening, and how often you should be screened. Colorectal Cancer  Routine colorectal cancer screening usually begins at 81 years of age and should be repeated every 5-10 years until you are 81 years old. You may need to be screened more often if early forms of precancerous  polyps or small growths are found. Your health care provider may recommend screening at an earlier age if you have risk factors for colon cancer.  Your health care provider may recommend using home test kits to check for hidden blood in the stool.  A small camera at the end of a tube can be used to examine your colon (sigmoidoscopy or colonoscopy). This checks for the earliest forms of colorectal cancer. Prostate and Testicular Cancer  Depending on your age and overall health, your health care provider may do certain tests to screen for prostate and testicular cancer.  Talk to your health care provider about any symptoms or concerns you have about testicular or prostate cancer. Skin Cancer  Check your skin from head to toe regularly.  Tell your health care provider about any new moles or changes in moles, especially if:  There is a change in a mole's size, shape, or color.  You have a mole that is larger than a pencil eraser.  Always use sunscreen. Apply sunscreen liberally and repeat throughout the day.  Protect yourself by wearing long sleeves, pants, a wide-brimmed hat, and sunglasses when outside. What should I know about heart disease, diabetes, and high blood pressure?  If you are 19-26 years of age, have your blood pressure checked every 3-5 years. If you are 62 years of age or older, have your blood pressure checked every year. You should have your blood pressure measured twice-once when you  are at a hospital or clinic, and once when you are not at a hospital or clinic. Record the average of the two measurements. To check your blood pressure when you are not at a hospital or clinic, you can use:  An automated blood pressure machine at a pharmacy.  A home blood pressure monitor.  Talk to your health care provider about your target blood pressure.  If you are between 25-51 years old, ask your health care provider if you should take aspirin to prevent heart disease.  Have  regular diabetes screenings by checking your fasting blood sugar level.  If you are at a normal weight and have a low risk for diabetes, have this test once every three years after the age of 46.  If you are overweight and have a high risk for diabetes, consider being tested at a younger age or more often.  A one-time screening for abdominal aortic aneurysm (AAA) by ultrasound is recommended for men aged 46-75 years who are current or former smokers. What should I know about preventing infection? Hepatitis B  If you have a higher risk for hepatitis B, you should be screened for this virus. Talk with your health care provider to find out if you are at risk for hepatitis B infection. Hepatitis C  Blood testing is recommended for:  Everyone born from [birth year] through 1963.  Anyone with known risk factors for hepatitis C. Sexually Transmitted Diseases (STDs)  You should be screened each year for STDs including gonorrhea and chlamydia if:  You are sexually active and are younger than 81 years of age.  You are older than 81 years of age and your health care provider tells you that you are at risk for this type of infection.  Your sexual activity has changed since you were last screened and you are at an increased risk for chlamydia or gonorrhea. Ask your health care provider if you are at risk.  Talk with your health care provider about whether you are at high risk of being infected with HIV. Your health care provider may recommend a prescription medicine to help prevent HIV infection. What else can I do?  Schedule regular health, dental, and eye exams.  Stay current with your vaccines (immunizations).  Do not use any tobacco products, such as cigarettes, chewing tobacco, and e-cigarettes. If you need help quitting, ask your health care provider.  Limit alcohol intake to no more than 2 drinks per day. One drink equals 12 ounces of beer, 5 ounces of wine, or 1 ounces of hard liquor.  Do  not use street drugs.  Do not share needles.  Ask your health care provider for help if you need support or information about quitting drugs.  Tell your health care provider if you often feel depressed.  Tell your health care provider if you have ever been abused or do not feel safe at home. This information is not intended to replace advice given to you by your health care provider. Make sure you discuss any questions you have with your health care provider. Document Released: 04/22/2008 Document Revised: 06/23/2016 Document Reviewed: 07/29/2015 Elsevier Interactive Patient Education  2017 [employer].

## 2017-01-27 NOTE — Assessment & Plan Note (Signed)
Checking lipid panel and adjust as needed for LDL <100. Taking zocor 20 mg daily without side effects.

## 2017-01-27 NOTE — Progress Notes (Signed)
   Subjective:    Patient ID: Randy [name], male    DOB: Mar 23, 1930, 83 y.o.   MRN: [id number]  HPI Here for medicare wellness and physical, no new complaints. Please see A/P for status and treatment of chronic medical problems.   Diet: heart healthy Physical activity: active Depression/mood screen: negative Hearing: moderate loss Visual acuity: grossly normal with lens, performs annual eye exam  ADLs: capable Fall risk: none Home safety: good Cognitive evaluation: intact to orientation, naming, recall and repetition EOL planning: adv directives discussed, in place  I have personally reviewed and have noted 1. The patient's medical and social history - reviewed today no changes 2. Their use of alcohol, tobacco or illicit drugs 3. Their current medications and supplements 4. The patient's functional ability including ADL's, fall risks, home safety risks and hearing or visual impairment. 5. Diet and physical activities 6. Evidence for depression or mood disorders 7. Care team reviewed and updated (available in snapshot)  Review of Systems  Constitutional: Negative.   HENT: Negative.   Eyes: Negative.   Respiratory: Negative for cough, chest tightness and shortness of breath.   Cardiovascular: Negative for chest pain, palpitations and leg swelling.  Gastrointestinal: Negative for abdominal distention, abdominal pain, constipation, diarrhea, nausea and vomiting.  Musculoskeletal: Negative.   Skin: Negative.   Neurological: Negative.   Psychiatric/Behavioral: Negative.       Objective:   Physical Exam  Constitutional: He is oriented to person, place, and time. He appears well-developed and well-nourished.  HENT:  Head: Normocephalic and atraumatic.  Eyes: EOM are normal.  Neck: Normal range of motion.  Cardiovascular: Normal rate and regular rhythm.   Pulmonary/Chest: Effort normal and breath sounds normal. No respiratory distress. He has no wheezes. He has no rales.    Abdominal: Soft. Bowel sounds are normal. He exhibits no distension. There is no tenderness. There is no rebound.  Musculoskeletal: He exhibits no edema.  Neurological: He is alert and oriented to person, place, and time. Coordination normal.  Skin: Skin is warm and dry.  Psychiatric: He has a normal mood and affect.   Vitals:   01/27/17 1312  BP: 130/60  Pulse: (!) 59  Resp: 14  Temp: 97.7 F (36.5 C)  TempSrc: Oral  SpO2: 98%  Weight: 153 lb (69.4 kg)  Height: 5\' 9"  (1.753 m)      Assessment & Plan:

## 2017-01-27 NOTE — Assessment & Plan Note (Signed)
BP at goal on his metoprolol, checking CMP and adjust as needed. Lasix is only prn and he has not used lately.

## 2017-01-27 NOTE — Assessment & Plan Note (Signed)
Refill ambien which he uses prn for sleep.

## 2017-01-27 NOTE — Assessment & Plan Note (Signed)
Uses flonase prn during allergy season and stable.

## 2017-01-27 NOTE — Progress Notes (Signed)
Pre visit review using our clinic review tool, if applicable. No additional management support is needed unless otherwise documented below in the visit note. 

## 2017-01-27 NOTE — Assessment & Plan Note (Signed)
Declines tetanus shot today, flu and pneumonia up to date. Aged out of colonoscopy. Counseled on sun safety and mole surveillance as well as home safety. Given 10 year screening recommendations.

## 2017-03-30 ENCOUNTER — Other Ambulatory Visit: Payer: Self-pay | Admitting: Internal Medicine

## 2017-04-21 DIAGNOSIS — C61 Malignant neoplasm of prostate: Secondary | ICD-10-CM | POA: Diagnosis not present

## 2017-04-21 LAB — PSA: PSA: 6.63

## 2017-04-27 ENCOUNTER — Other Ambulatory Visit: Payer: Self-pay | Admitting: Urology

## 2017-04-27 DIAGNOSIS — C61 Malignant neoplasm of prostate: Secondary | ICD-10-CM

## 2017-05-02 ENCOUNTER — Telehealth: Payer: Self-pay | Admitting: Internal Medicine

## 2017-05-02 NOTE — Telephone Encounter (Signed)
Pt would like to increase his dose of sertraline (ZOLOFT) 25 MG tablet  He has been rediagnosed with prostates cancer and is very depressed, they would like to take 2x day so 50mg .  The wife states they have a lot of the 25mg  left over, so they can use those or they would like something else called in

## 2017-05-02 NOTE — Telephone Encounter (Signed)
Ok to increase dose to 25mg  BID. He needs an OV to re eval mood in 4weeks.

## 2017-05-02 NOTE — Telephone Encounter (Signed)
Pease advise if patient would need visit

## 2017-05-03 NOTE — Telephone Encounter (Signed)
Patient contacted and stated awareness 

## 2017-05-09 ENCOUNTER — Encounter (HOSPITAL_COMMUNITY)
Admission: RE | Admit: 2017-05-09 | Discharge: 2017-05-09 | Disposition: A | Discharge status: Home / Self Care | Payer: Medicare Other | Source: Ambulatory Visit | Attending: Urology | Admitting: Urology

## 2017-05-09 DIAGNOSIS — C61 Malignant neoplasm of prostate: Secondary | ICD-10-CM | POA: Diagnosis not present

## 2017-05-09 MED ORDER — TECHNETIUM TC 99M MEDRONATE IV KIT
21.5000 | PACK | Freq: Once | INTRAVENOUS | Status: AC | PRN
  Administered 2017-05-09: 21.5 via INTRAVENOUS

## 2017-05-25 DIAGNOSIS — C61 Malignant neoplasm of prostate: Secondary | ICD-10-CM | POA: Diagnosis not present

## 2017-06-03 LAB — CHG URINALYSIS NONAUTO W/O SCOPE
BILIRUBIN: NEGATIVE
BLOOD: NEGATIVE
Glucose: NEGATIVE
KETONE: NEGATIVE
Leukocyte Esterase: NEGATIVE
NITRITES UR: NEGATIVE
PH: 5.5
Protein: NEGATIVE
SPECIFIC GRAVITY: 1.025
UROBILINOGEN UA: NORMAL

## 2017-06-03 LAB — TESTOSTERONE, TOTAL AND FREE DIRECT MEASURE

## 2017-06-30 ENCOUNTER — Other Ambulatory Visit: Payer: Self-pay | Admitting: Internal Medicine

## 2017-06-30 MED ORDER — ZOLPIDEM TARTRATE 10 MG PO TABS
10.0000 mg | ORAL_TABLET | Freq: Every evening | ORAL | 3 refills | Status: DC | PRN
Start: 2017-06-30 — End: 2017-11-14

## 2017-06-30 NOTE — Telephone Encounter (Signed)
Check [place] registry last filled 05/26/2017 pls advise in MD absence...Randy [name]

## 2017-06-30 NOTE — Telephone Encounter (Signed)
Done hardcopy to [place]  

## 2017-07-01 NOTE — Telephone Encounter (Signed)
Faxed script back to [facility] pharmacy.../lmb  

## 2017-09-16 DIAGNOSIS — C61 Malignant neoplasm of prostate: Secondary | ICD-10-CM | POA: Diagnosis not present

## 2017-09-16 LAB — PSA: PSA: 3.59

## 2017-09-23 ENCOUNTER — Encounter: Payer: Self-pay | Admitting: Internal Medicine

## 2017-09-23 ENCOUNTER — Ambulatory Visit (INDEPENDENT_AMBULATORY_CARE_PROVIDER_SITE_OTHER): Payer: Medicare Other | Admitting: Internal Medicine

## 2017-09-23 VITALS — BP 132/70 | HR 62 | Temp 97.6°F | Ht 69.0 in | Wt 149.0 lb

## 2017-09-23 DIAGNOSIS — Z23 Encounter for immunization: Secondary | ICD-10-CM

## 2017-09-23 DIAGNOSIS — L989 Disorder of the skin and subcutaneous tissue, unspecified: Secondary | ICD-10-CM

## 2017-09-23 NOTE — Assessment & Plan Note (Signed)
Suspicious appearing and needs removal. Referral to dermatology placed.

## 2017-09-23 NOTE — Patient Instructions (Signed)
We will get you in with the dermatologist. 

## 2017-09-23 NOTE — Progress Notes (Signed)
   Subjective:    Patient ID: Randy [name], male    DOB: May 19, 1930, 83 y.o.   MRN: [id number]  HPI The patient is an 83 YO man coming in for spot on his right arm. He noticed it ago several months ago. He thought it was an abscess and put a pin in it but was not able to get any drainage. It is reddish now and he keeps it covered with a bandage and uses antibiotic ointment on it. Has grown some since he noticed it but not lately. No pain or itching.  Review of Systems  Constitutional: Negative.   Respiratory: Negative for cough, chest tightness and shortness of breath.   Cardiovascular: Negative for chest pain, palpitations and leg swelling.  Gastrointestinal: Negative for abdominal distention, abdominal pain, constipation, diarrhea, nausea and vomiting.  Musculoskeletal: Negative.   Skin: Positive for wound.  Neurological: Negative.        Objective:   Physical Exam  Constitutional: He is oriented to person, place, and time. He appears well-developed and well-nourished.  HENT:  Head: Normocephalic and atraumatic.  Eyes: EOM are normal.  Neck: Normal range of motion.  Cardiovascular: Normal rate and regular rhythm.  Pulmonary/Chest: Effort normal and breath sounds normal. No respiratory distress. He has no wheezes. He has no rales.  Abdominal: Soft.  Musculoskeletal: He exhibits no edema.  Neurological: He is alert and oriented to person, place, and time. Coordination normal.  Skin: Skin is warm and dry.  Lesion on the right forearm which appears to be cancerous or pre-cancerous about 5 mm circular and firm  Psychiatric: He has a normal mood and affect.   Vitals:   09/23/17 1438  BP: 132/70  Pulse: 62  Temp: 97.6 F (36.4 C)  TempSrc: Oral  SpO2: 100%  Weight: 149 lb (67.6 kg)  Height: 5\' 9"  (1.753 m)      Assessment & Plan:  Flu shot given at visit

## 2017-09-28 DIAGNOSIS — C61 Malignant neoplasm of prostate: Secondary | ICD-10-CM | POA: Diagnosis not present

## 2017-10-05 ENCOUNTER — Encounter: Payer: Self-pay | Admitting: Internal Medicine

## 2017-11-05 ENCOUNTER — Other Ambulatory Visit: Payer: Self-pay | Admitting: Internal Medicine

## 2017-11-14 ENCOUNTER — Other Ambulatory Visit: Payer: Self-pay | Admitting: Internal Medicine

## 2017-12-14 DIAGNOSIS — L821 Other seborrheic keratosis: Secondary | ICD-10-CM | POA: Diagnosis not present

## 2017-12-14 DIAGNOSIS — D485 Neoplasm of uncertain behavior of skin: Secondary | ICD-10-CM | POA: Diagnosis not present

## 2017-12-14 DIAGNOSIS — D489 Neoplasm of uncertain behavior, unspecified: Secondary | ICD-10-CM | POA: Diagnosis not present

## 2017-12-14 DIAGNOSIS — L570 Actinic keratosis: Secondary | ICD-10-CM | POA: Diagnosis not present

## 2017-12-14 DIAGNOSIS — D225 Melanocytic nevi of trunk: Secondary | ICD-10-CM | POA: Diagnosis not present

## 2017-12-20 ENCOUNTER — Other Ambulatory Visit: Payer: Self-pay | Admitting: Internal Medicine

## 2018-01-02 DIAGNOSIS — C61 Malignant neoplasm of prostate: Secondary | ICD-10-CM | POA: Diagnosis not present

## 2018-01-10 DIAGNOSIS — C61 Malignant neoplasm of prostate: Secondary | ICD-10-CM | POA: Diagnosis not present

## 2018-01-16 DIAGNOSIS — M25561 Pain in right knee: Secondary | ICD-10-CM | POA: Diagnosis not present

## 2018-01-16 DIAGNOSIS — M1711 Unilateral primary osteoarthritis, right knee: Secondary | ICD-10-CM | POA: Diagnosis not present

## 2018-01-23 DIAGNOSIS — M25561 Pain in right knee: Secondary | ICD-10-CM | POA: Diagnosis not present

## 2018-02-01 ENCOUNTER — Other Ambulatory Visit: Payer: Self-pay | Admitting: Internal Medicine

## 2018-02-06 DIAGNOSIS — M25561 Pain in right knee: Secondary | ICD-10-CM | POA: Diagnosis not present

## 2018-02-07 ENCOUNTER — Other Ambulatory Visit: Payer: Self-pay | Admitting: Internal Medicine

## 2018-02-07 DIAGNOSIS — M25561 Pain in right knee: Secondary | ICD-10-CM | POA: Diagnosis not present

## 2018-02-07 DIAGNOSIS — M1711 Unilateral primary osteoarthritis, right knee: Secondary | ICD-10-CM | POA: Diagnosis not present

## 2018-02-08 DIAGNOSIS — Z5111 Encounter for antineoplastic chemotherapy: Secondary | ICD-10-CM | POA: Diagnosis not present

## 2018-02-08 DIAGNOSIS — C61 Malignant neoplasm of prostate: Secondary | ICD-10-CM | POA: Diagnosis not present

## 2018-02-17 DIAGNOSIS — M25561 Pain in right knee: Secondary | ICD-10-CM | POA: Diagnosis not present

## 2018-02-23 ENCOUNTER — Other Ambulatory Visit: Payer: Self-pay | Admitting: Orthopedic Surgery

## 2018-02-28 ENCOUNTER — Encounter (HOSPITAL_COMMUNITY): Payer: Self-pay | Admitting: *Deleted

## 2018-02-28 ENCOUNTER — Other Ambulatory Visit: Payer: Self-pay

## 2018-02-28 DIAGNOSIS — S83241A Other tear of medial meniscus, current injury, right knee, initial encounter: Secondary | ICD-10-CM | POA: Diagnosis present

## 2018-02-28 NOTE — H&P (Signed)
Randy [name] is an 84 y.o. male.   Chief Complaint: Right Knee Pain  HPI: Randy [name] is here today to discuss his right knee pain.  He was recently seen by Dr. Rip Harbour on 02/07/18 and diagnosed with a complex medial meniscal tear and arthritis.  He received a steroid injection however, this injection really did not provide significant relief.  He is here today to discuss a knee arthroscopy.  He denies any new injuries.  He is walking with a cane.  Past Medical History:  Diagnosis Date  . Acute MI ([place])   . Cancer ([place])   . Coronary artery disease   . Hyperlipidemia   . Hypertension     No past surgical history on file.  No family history on file. Social History:  reports that he has never smoked. He has never used smokeless tobacco. He reports that he does not drink alcohol or use drugs.  Allergies:  Allergies  Allergen Reactions  . Nsaids     Had a heart attack, so can not take NSAIDS    No medications prior to admission.    No results found for this or any previous visit (from the past 48 hour(s)). No results found.  Review of Systems  Constitutional: Negative.   HENT: Negative.   Eyes: Negative.   Respiratory: Negative.   Cardiovascular: Negative.   Gastrointestinal: Negative.   Genitourinary: Negative.   Musculoskeletal: Positive for joint pain.  Skin: Negative.   Neurological: Negative.   Endo/Heme/Allergies: Negative.   Psychiatric/Behavioral: Negative.     There were no vitals taken for this visit. Physical Exam  Constitutional: He is oriented to person, place, and time. He appears well-developed and well-nourished.  HENT:  Head: Normocephalic and atraumatic.  Eyes: Pupils are equal, round, and reactive to light.  Neck: Normal range of motion. Neck supple.  Cardiovascular: Normal rate and regular rhythm.  Respiratory: Effort normal.  Musculoskeletal: He exhibits tenderness.  Today, he does have tenderness over the medial and lateral joint line.   Overall good range of motion and no noticeable effusion.  No erythema or warmth.  He continues to have an antalgic gait favoring the right side.    Neurological: He is alert and oriented to person, place, and time.  Skin: Skin is warm and dry.  Psychiatric: He has a normal mood and affect. His behavior is normal. Judgment and thought content normal.     Assessment/Plan  Assess: Right knee pain with multifactorial etiology including a complex medial meniscal tear and arthritis in the same medial compartment  Plan:  Treatment options are discussed with the patient and his wife.  Today, the patient has elected to proceed with a right knee arthroscopy.  The benefits risks and tender complications of surgery are discussed.  He wishes to proceed.   He is given a refill of tramadol 50 mg one tablet every 4-6 hours #30.  He is to call with any issues.  Joanell Rising, PA-C 02/28/2018, 8:14 AM

## 2018-02-28 NOTE — Anesthesia Preprocedure Evaluation (Signed)
Anesthesia Evaluation  Patient identified by MRN, date of birth, ID band Patient awake    Reviewed: Allergy & Precautions, H&P , NPO status , Patient's Chart, lab work & pertinent test results, reviewed documented beta blocker date and time   Airway Mallampati: II  TM Distance: >3 FB Neck ROM: full    Dental no notable dental hx.    Pulmonary neg pulmonary ROS,    Pulmonary exam normal breath sounds clear to auscultation       Cardiovascular Exercise Tolerance: Good hypertension, Pt. on medications + CAD and + Past MI   Rhythm:regular Rate:Normal     Neuro/Psych negative neurological ROS  negative psych ROS   GI/Hepatic negative GI ROS, Neg liver ROS, GERD  ,  Endo/Other  negative endocrine ROS  Renal/GU negative Renal ROS  negative genitourinary   Musculoskeletal  (+) Arthritis , Osteoarthritis,    Abdominal   Peds  Hematology negative hematology ROS (+)   Anesthesia Other Findings   Reproductive/Obstetrics negative OB ROS                             Anesthesia Physical Anesthesia Plan  ASA: III  Anesthesia Plan: General   Post-op Pain Management:    Induction:   PONV Risk Score and Plan: 2 and Ondansetron and Treatment may vary due to age or medical condition  Airway Management Planned: LMA  Additional Equipment:   Intra-op Plan:   Post-operative Plan:   Informed Consent: I have reviewed the patients History and Physical, chart, labs and discussed the procedure including the risks, benefits and alternatives for the proposed anesthesia with the patient or authorized representative who has indicated his/her understanding and acceptance.   Dental Advisory Given  Plan Discussed with: CRNA, Anesthesiologist and Surgeon  Anesthesia Plan Comments:         Anesthesia Quick Evaluation

## 2018-02-28 NOTE — Progress Notes (Signed)
Anesthesia Chart Review: SAME DAY WORK-UP.  Case:  [id number] Date/Time:  03/01/18 1045   Procedure:  ARTHROSCOPY KNEE (Right )   Anesthesia type:  General   Pre-op diagnosis:  RIGHT KNEE MEDIAL MENISCAL TEAR   Location:  [place] [facility] [place] [facility]   Surgeon:  Frederik Pear, MD     DISCUSSION: Patient is an 84 year old male scheduled for the above procedure. 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    He has not see cardiology since 2013. He reports that he did not go for follow-up because he was not having any cardiac symptoms. He is retired from the [employer]. He has not had to use Nitro. He says that his MI in 1994 presented with diaphoresis and feeling poorly, but no discrete chest pain. He has not had any recurrent symptoms. He denied chest pain, SOB, syncope, edema, palpitations, orthopnea. He reports that he stays active, and activity had not been limited until about 3 weeks ago when he injured his knee while remodeling his kitchen. He has stairs to his basement and to his upstairs that he can climb without CV symptoms. He does yard work. Within the past 1-2 years he cleaned his gutters, put flooring down, and changed out light fixtures. He lives at home with his wife.    He is on b-blocker, statin, ASA (last dose 02/27/18).   Discussed above with anesthesiologist Dr. Suzette Battiest. Patient is 84 years old with known cardiac history, but with recent activity level of > 4 METS. He will need an EKG and labs on arrival to assess for any new changes. If results acceptable and remains asymptomatic from a CV standpoint then it is anticipated that he can proceed with this procedure as planned.  PROVIDERS: [name], MD as PCP Jodene Nam, MD as Urology Daneen Schick, MD as Cardiology, now with [facility], but previously with [facility]. Patient was last seen at [facility] on  05/03/12 for follow-up of nonspecific symptoms of fatigue and dyspnea on exertion. Exercise treadmill test offered, but patient declined as his symptoms had resolved. Patient thought he was just dehydrated. He denied chest pain.   LABS: He will need updated labs prior to surgery.  EKG: Last EKG in Epic was in 2016. He will need an updated EKG prior to surgery.  CV: ETT 11/20/07. (copy from [facility]): Observation Baseline EKG showed normal sinus rhythm at 63 bpm.  During exercise and occasional PVC was noted.  There were no EKG changes suggestive of ischemia.  Recovery was uneventful.  BP response was appropriate.  Nuclear stress test 08/09/00 (copy from [place] [place] [facility]): Conclusions: 1.  Negative test for ischemia.  Mild reduction in uptake in distal anterolateral wall probably represents residual from the patient's known anterior infarction in 1994. 2.  Normal wall motion study with EF 76%.  Cardiac cath 11/30/94 (copy from [place] [facility]): Conclusions: 1.  Overall normal left ventricular function with anterior and anterior apical wall motion abnormality, consistent with prior anterior apical infarction. 2.  Coronary atherosclerotic heart disease with 50 to 60% proximal left anterior descending artery stenosis.  60% stenosis in the ramus intermedius branch, 50% in the ostium of the right coronary, and 90% stenosis of the first obtuse marginal. Recommendation: PTCA of the first obtuse marginal.  (I don't have the PCI report, but Dr. Thompson Caul 2013 office note does indicate that patient had PTCA OM1  in 1996.)  Past Medical History:  Diagnosis Date  . Acute MI ([place])   . Cancer ([place])   . Coronary artery disease   . Hyperlipidemia   . Hypertension     Past Surgical History:  Procedure Laterality Date  . CORONARY ANGIOPLASTY     11/30/94 (Dr. Daneen Schick): Mild ant/anteroapical hypokinesis (known ant MI '94), EF 60%. 50-60%pLAD, 60% RI, 50% oRCA, 90% OM1 (s/p PTCA '96)    MEDICATIONS: No current  facility-administered medications for this encounter.    Marland Kitchen acetaminophen (TYLENOL) 500 MG tablet  . ASPERCREME LIDOCAINE EX  . aspirin 325 MG EC tablet  . Calcium Carb-Cholecalciferol (CALCIUM 1000 + D PO)  . ERLEADA 60 MG tablet  . fish oil-omega-3 fatty acids 1000 MG capsule  . fluticasone (FLONASE) 50 MCG/ACT nasal spray  . HYDROcodone-acetaminophen (NORCO/VICODIN) 5-325 MG tablet  . metoprolol tartrate (LOPRESSOR) 50 MG tablet  . niacin 500 MG tablet  . nitroGLYCERIN (NITROSTAT) 0.4 MG SL tablet  . sertraline (ZOLOFT) 25 MG tablet  . simvastatin (ZOCOR) 20 MG tablet  . zolpidem (AMBIEN) 10 MG tablet  . furosemide (LASIX) 40 MG tablet   [facility]/Anesthesiology Phone [phone] 02/28/2018 2:18 PM

## 2018-02-28 NOTE — Progress Notes (Signed)
SDW-pre-op call completed by both pt and pt spouse, Jeanett Schlein (per pt verbal consent). Pt denies SOB, chest pain, and being under the care of a cardiologist. Spouse denies that pt had an echo. Spouse denies that pt had an EKG and chest x ray within the last year. Spouse denies recent labs. Pt stated " I last saw Dr. 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 Spouse stated that pt had not stopped his Aspirin 325 mg daily or fish oil BID. Spouse stated that pt had not taken his Aspirin or fish oil yet. Spouse advised to have pt immediately stop taking vitamins, fish oil, herbal medications and NSAIDs ie: Ibuprofen, Advil, Naproxen Aleve, Motrin, BC and Goody Powder and to call surgeons office to make MD aware that Aspirin had not been stopped and if pt should take today's scheduled dose. Pt PCP is Dr. Pricilla Holm. Anesthesia to review pt history.

## 2018-03-01 ENCOUNTER — Encounter (HOSPITAL_COMMUNITY): Payer: Self-pay | Admitting: *Deleted

## 2018-03-01 ENCOUNTER — Encounter (HOSPITAL_COMMUNITY)
Admission: RE | Disposition: A | Discharge status: Home / Self Care | Payer: Self-pay | Source: Ambulatory Visit | Attending: Orthopedic Surgery

## 2018-03-01 ENCOUNTER — Ambulatory Visit (HOSPITAL_COMMUNITY): Payer: Medicare Other | Admitting: Vascular Surgery

## 2018-03-01 ENCOUNTER — Ambulatory Visit (HOSPITAL_COMMUNITY)
Admission: RE | Admit: 2018-03-01 | Discharge: 2018-03-01 | Disposition: A | Discharge status: Home / Self Care | Payer: Medicare Other | Source: Ambulatory Visit | Attending: Orthopedic Surgery | Admitting: Orthopedic Surgery

## 2018-03-01 DIAGNOSIS — S83281A Other tear of lateral meniscus, current injury, right knee, initial encounter: Secondary | ICD-10-CM | POA: Insufficient documentation

## 2018-03-01 DIAGNOSIS — M11261 Other chondrocalcinosis, right knee: Secondary | ICD-10-CM | POA: Diagnosis not present

## 2018-03-01 DIAGNOSIS — M23231 Derangement of other medial meniscus due to old tear or injury, right knee: Secondary | ICD-10-CM | POA: Diagnosis not present

## 2018-03-01 DIAGNOSIS — K219 Gastro-esophageal reflux disease without esophagitis: Secondary | ICD-10-CM | POA: Diagnosis not present

## 2018-03-01 DIAGNOSIS — Z859 Personal history of malignant neoplasm, unspecified: Secondary | ICD-10-CM | POA: Diagnosis not present

## 2018-03-01 DIAGNOSIS — Y939 Activity, unspecified: Secondary | ICD-10-CM | POA: Insufficient documentation

## 2018-03-01 DIAGNOSIS — Z886 Allergy status to analgesic agent status: Secondary | ICD-10-CM | POA: Insufficient documentation

## 2018-03-01 DIAGNOSIS — I10 Essential (primary) hypertension: Secondary | ICD-10-CM | POA: Insufficient documentation

## 2018-03-01 DIAGNOSIS — Z7982 Long term (current) use of aspirin: Secondary | ICD-10-CM | POA: Diagnosis not present

## 2018-03-01 DIAGNOSIS — M23261 Derangement of other lateral meniscus due to old tear or injury, right knee: Secondary | ICD-10-CM | POA: Diagnosis not present

## 2018-03-01 DIAGNOSIS — M1990 Unspecified osteoarthritis, unspecified site: Secondary | ICD-10-CM | POA: Diagnosis not present

## 2018-03-01 DIAGNOSIS — E785 Hyperlipidemia, unspecified: Secondary | ICD-10-CM | POA: Insufficient documentation

## 2018-03-01 DIAGNOSIS — Z79899 Other long term (current) drug therapy: Secondary | ICD-10-CM | POA: Diagnosis not present

## 2018-03-01 DIAGNOSIS — X58XXXA Exposure to other specified factors, initial encounter: Secondary | ICD-10-CM | POA: Insufficient documentation

## 2018-03-01 DIAGNOSIS — S83241A Other tear of medial meniscus, current injury, right knee, initial encounter: Secondary | ICD-10-CM | POA: Diagnosis not present

## 2018-03-01 DIAGNOSIS — I252 Old myocardial infarction: Secondary | ICD-10-CM | POA: Diagnosis not present

## 2018-03-01 DIAGNOSIS — M23221 Derangement of posterior horn of medial meniscus due to old tear or injury, right knee: Secondary | ICD-10-CM | POA: Insufficient documentation

## 2018-03-01 DIAGNOSIS — I2510 Atherosclerotic heart disease of native coronary artery without angina pectoris: Secondary | ICD-10-CM | POA: Insufficient documentation

## 2018-03-01 DIAGNOSIS — G8918 Other acute postprocedural pain: Secondary | ICD-10-CM | POA: Diagnosis not present

## 2018-03-01 HISTORY — PX: KNEE ARTHROSCOPY: SHX127

## 2018-03-01 HISTORY — DX: Unspecified osteoarthritis, unspecified site: M19.90

## 2018-03-01 HISTORY — DX: Gastro-esophageal reflux disease without esophagitis: K21.9

## 2018-03-01 HISTORY — DX: Presence of spectacles and contact lenses: Z97.3

## 2018-03-01 LAB — CBC
HEMATOCRIT: 40.4 % (ref 39.0–52.0)
Hemoglobin: 14 g/dL (ref 13.0–17.0)
MCH: 31.7 pg (ref 26.0–34.0)
MCHC: 34.7 g/dL (ref 30.0–36.0)
MCV: 91.4 fL (ref 78.0–100.0)
PLATELETS: 157 10*3/uL (ref 150–400)
RBC: 4.42 MIL/uL (ref 4.22–5.81)
RDW: 13.1 % (ref 11.5–15.5)
WBC: 4.8 10*3/uL (ref 4.0–10.5)

## 2018-03-01 LAB — BASIC METABOLIC PANEL
Anion gap: 8 (ref 5–15)
BUN: 19 mg/dL (ref 6–20)
CHLORIDE: 104 mmol/L (ref 101–111)
CO2: 24 mmol/L (ref 22–32)
Calcium: 9 mg/dL (ref 8.9–10.3)
Creatinine, Ser: 1.1 mg/dL (ref 0.61–1.24)
GFR calc Af Amer: >60 mL/min (ref >60)
GFR, EST NON AFRICAN AMERICAN: 59 mL/min — AB (ref >60)
GLUCOSE: 101 mg/dL — AB (ref 65–99)
POTASSIUM: 4.2 mmol/L (ref 3.5–5.1)
Sodium: 136 mmol/L (ref 135–145)

## 2018-03-01 SURGERY — ARTHROSCOPY, KNEE
Anesthesia: General | Laterality: Right

## 2018-03-01 MED ORDER — METOCLOPRAMIDE HCL 5 MG PO TABS: 5.0000 mg | ORAL_TABLET | Freq: Three times a day (TID) | ORAL | Status: DC | PRN

## 2018-03-01 MED ORDER — MIDAZOLAM HCL 2 MG/2ML IJ SOLN
INTRAMUSCULAR | Status: AC
  Filled 2018-03-01: qty 2

## 2018-03-01 MED ORDER — CHLORHEXIDINE GLUCONATE 4 % EX LIQD: 60.0000 mL | Freq: Once | CUTANEOUS | Status: DC

## 2018-03-01 MED ORDER — BUPIVACAINE-EPINEPHRINE (PF) 0.5% -1:200000 IJ SOLN
INTRAMUSCULAR | Status: AC
  Filled 2018-03-01: qty 30

## 2018-03-01 MED ORDER — 0.9 % SODIUM CHLORIDE (POUR BTL) OPTIME
TOPICAL | Status: DC | PRN
  Administered 2018-03-01: 1000 mL

## 2018-03-01 MED ORDER — FENTANYL CITRATE (PF) 250 MCG/5ML IJ SOLN
INTRAMUSCULAR | Status: AC
  Filled 2018-03-01: qty 5

## 2018-03-01 MED ORDER — PROPOFOL 10 MG/ML IV BOLUS
INTRAVENOUS | Status: AC
  Filled 2018-03-01: qty 20

## 2018-03-01 MED ORDER — BUPIVACAINE-EPINEPHRINE (PF) 0.5% -1:200000 IJ SOLN
INTRAMUSCULAR | Status: DC | PRN
  Administered 2018-03-01: 25 mL

## 2018-03-01 MED ORDER — FENTANYL CITRATE (PF) 100 MCG/2ML IJ SOLN
INTRAMUSCULAR | Status: AC
  Administered 2018-03-01: 100 ug
  Filled 2018-03-01: qty 2

## 2018-03-01 MED ORDER — EPINEPHRINE PF 1 MG/ML IJ SOLN
INTRAMUSCULAR | Status: AC
  Filled 2018-03-01: qty 1

## 2018-03-01 MED ORDER — PROPOFOL 10 MG/ML IV BOLUS
INTRAVENOUS | Status: DC | PRN
  Administered 2018-03-01: 130 mg via INTRAVENOUS

## 2018-03-01 MED ORDER — ONDANSETRON HCL 4 MG/2ML IJ SOLN
4.0000 mg | Freq: Four times a day (QID) | INTRAMUSCULAR | Status: DC | PRN
Start: 2018-03-01 — End: 2018-03-01

## 2018-03-01 MED ORDER — CEFAZOLIN SODIUM-DEXTROSE 2-4 GM/100ML-% IV SOLN
2.0000 g | INTRAVENOUS | Status: AC
  Administered 2018-03-01: 2 g via INTRAVENOUS
  Filled 2018-03-01: qty 100

## 2018-03-01 MED ORDER — HYDROCODONE-ACETAMINOPHEN 5-325 MG PO TABS: 1.0000 | ORAL_TABLET | ORAL | 0 refills | Status: DC | PRN

## 2018-03-01 MED ORDER — FENTANYL CITRATE (PF) 100 MCG/2ML IJ SOLN: 25.0000 ug | INTRAMUSCULAR | Status: DC | PRN

## 2018-03-01 MED ORDER — BUPIVACAINE-EPINEPHRINE 0.5% -1:200000 IJ SOLN
INTRAMUSCULAR | Status: DC | PRN
  Administered 2018-03-01: 10 mL

## 2018-03-01 MED ORDER — EPINEPHRINE PF 1 MG/ML IJ SOLN
INTRAMUSCULAR | Status: DC | PRN
  Administered 2018-03-01: 1 mg

## 2018-03-01 MED ORDER — STERILE WATER FOR IRRIGATION IR SOLN
Status: DC | PRN
  Administered 2018-03-01: 1000 mL

## 2018-03-01 MED ORDER — EPHEDRINE SULFATE 50 MG/ML IJ SOLN
INTRAMUSCULAR | Status: DC | PRN
  Administered 2018-03-01 (×2): 10 mg via INTRAVENOUS

## 2018-03-01 MED ORDER — HYDROCODONE-ACETAMINOPHEN 5-325 MG PO TABS
1.0000 | ORAL_TABLET | Freq: Once | ORAL | Status: AC
  Administered 2018-03-01: 1 via ORAL

## 2018-03-01 MED ORDER — ONDANSETRON HCL 4 MG PO TABS: 4.0000 mg | ORAL_TABLET | Freq: Four times a day (QID) | ORAL | Status: DC | PRN

## 2018-03-01 MED ORDER — LIDOCAINE-EPINEPHRINE 1 %-1:100000 IJ SOLN
INTRAMUSCULAR | Status: DC | PRN
  Administered 2018-03-01: 10 mL

## 2018-03-01 MED ORDER — DEXAMETHASONE SODIUM PHOSPHATE 4 MG/ML IJ SOLN
INTRAMUSCULAR | Status: DC | PRN
  Administered 2018-03-01: 10 mg via INTRAVENOUS

## 2018-03-01 MED ORDER — METOCLOPRAMIDE HCL 5 MG/ML IJ SOLN: 5.0000 mg | Freq: Three times a day (TID) | INTRAMUSCULAR | Status: DC | PRN

## 2018-03-01 MED ORDER — SODIUM CHLORIDE 0.9 % IR SOLN
Status: DC | PRN
  Administered 2018-03-01 (×2): 3000 mL

## 2018-03-01 MED ORDER — HYDROCODONE-ACETAMINOPHEN 5-325 MG PO TABS
ORAL_TABLET | ORAL | Status: AC
  Administered 2018-03-01: 1 via ORAL
  Filled 2018-03-01: qty 1

## 2018-03-01 MED ORDER — ONDANSETRON HCL 4 MG/2ML IJ SOLN
INTRAMUSCULAR | Status: AC
  Filled 2018-03-01: qty 2

## 2018-03-01 MED ORDER — ONDANSETRON HCL 4 MG/2ML IJ SOLN
INTRAMUSCULAR | Status: DC | PRN
  Administered 2018-03-01: 4 mg via INTRAVENOUS

## 2018-03-01 MED ORDER — LIDOCAINE 2% (20 MG/ML) 5 ML SYRINGE
INTRAMUSCULAR | Status: DC | PRN
  Administered 2018-03-01: 60 mg via INTRAVENOUS

## 2018-03-01 MED ORDER — MEPERIDINE HCL 50 MG/ML IJ SOLN: 6.2500 mg | INTRAMUSCULAR | Status: DC | PRN

## 2018-03-01 MED ORDER — LACTATED RINGERS IV SOLN
INTRAVENOUS | Status: DC
  Administered 2018-03-01: 10:00:00 via INTRAVENOUS

## 2018-03-01 MED ORDER — EPHEDRINE 5 MG/ML INJ
INTRAVENOUS | Status: AC
  Filled 2018-03-01: qty 10

## 2018-03-01 SURGICAL SUPPLY — 34 items
BANDAGE ACE 6X5 VEL STRL LF (GAUZE/BANDAGES/DRESSINGS) ×3 IMPLANT
BLADE CUDA 5.5 (BLADE) IMPLANT
BLADE CUTTER GATOR 3.5 (BLADE) ×3 IMPLANT
BLADE GREAT WHITE 4.2 (BLADE) ×1 IMPLANT
BLADE GREAT WHITE 4.2MM (BLADE) ×1
BUR OVAL 6.0 (BURR) IMPLANT
DRAPE ARTHROSCOPY W/POUCH 114 (DRAPES) ×3 IMPLANT
DURAPREP 26ML APPLICATOR (WOUND CARE) ×3 IMPLANT
GAUZE SPONGE 4X4 12PLY STRL (GAUZE/BANDAGES/DRESSINGS) ×3 IMPLANT
GAUZE SPONGE 4X4 12PLY STRL LF (GAUZE/BANDAGES/DRESSINGS) ×2 IMPLANT
GAUZE XEROFORM 1X8 LF (GAUZE/BANDAGES/DRESSINGS) ×3 IMPLANT
GLOVE BIO SURGEON STRL SZ7.5 (GLOVE) ×3 IMPLANT
GLOVE BIO SURGEON STRL SZ8.5 (GLOVE) ×6 IMPLANT
GLOVE BIOGEL PI IND STRL 8 (GLOVE) ×2 IMPLANT
GLOVE BIOGEL PI IND STRL 9 (GLOVE) ×1 IMPLANT
GLOVE BIOGEL PI INDICATOR 8 (GLOVE) ×4
GLOVE BIOGEL PI INDICATOR 9 (GLOVE) ×2
GOWN STRL REUS W/ TWL LRG LVL3 (GOWN DISPOSABLE) ×3 IMPLANT
GOWN STRL REUS W/ TWL XL LVL3 (GOWN DISPOSABLE) ×2 IMPLANT
GOWN STRL REUS W/TWL LRG LVL3 (GOWN DISPOSABLE) ×9
GOWN STRL REUS W/TWL XL LVL3 (GOWN DISPOSABLE) ×6
KIT BASIN OR (CUSTOM PROCEDURE TRAY) ×3 IMPLANT
KIT TURNOVER KIT B (KITS) ×3 IMPLANT
MANIFOLD NEPTUNE II (INSTRUMENTS) ×3 IMPLANT
PACK ARTHROSCOPY DSU (CUSTOM PROCEDURE TRAY) ×3 IMPLANT
PAD ARMBOARD 7.5X6 YLW CONV (MISCELLANEOUS) ×6 IMPLANT
PAD CAST 4YDX4 CTTN HI CHSV (CAST SUPPLIES) IMPLANT
PADDING CAST COTTON 4X4 STRL (CAST SUPPLIES) ×3
PROBE BIPOLAR ATHRO 135MM 90D (MISCELLANEOUS) ×2 IMPLANT
SET ARTHROSCOPY TUBING (MISCELLANEOUS) ×3
SET ARTHROSCOPY TUBING LN (MISCELLANEOUS) ×1 IMPLANT
SPONGE LAP 4X18 X RAY DECT (DISPOSABLE) ×3 IMPLANT
TOWEL OR 17X24 6PK STRL BLUE (TOWEL DISPOSABLE) ×4 IMPLANT
WATER STERILE IRR 1000ML POUR (IV SOLUTION) ×3 IMPLANT

## 2018-03-01 NOTE — Interval H&P Note (Signed)
History and Physical Interval Note:  03/01/2018 11:42 AM  Randy [name]  has presented today for surgery, with the diagnosis of RIGHT KNEE MEDIAL MENISCAL TEAR  The various methods of treatment have been discussed with the patient and family. After consideration of risks, benefits and other options for treatment, the patient has consented to  Procedure(s): ARTHROSCOPY KNEE (Right) as a surgical intervention .  The patient's history has been reviewed, patient examined, no change in status, stable for surgery.  I have reviewed the patient's chart and labs.  Questions were answered to the patient's satisfaction.     Kerin Salen

## 2018-03-01 NOTE — Anesthesia Procedure Notes (Signed)
Procedure Name: LMA Insertion Date/Time: 03/01/2018 12:20 PM Performed by: Candis Shine, CRNA Pre-anesthesia Checklist: Patient identified, Emergency Drugs available, Suction available and Patient being monitored Patient Re-evaluated:Patient Re-evaluated prior to induction Oxygen Delivery Method: Circle System Utilized Preoxygenation: Pre-oxygenation with 100% oxygen Induction Type: IV induction Ventilation: Mask ventilation without difficulty LMA: LMA inserted LMA Size: 4.0 Number of attempts: 1 Placement Confirmation: positive ETCO2 and breath sounds checked- equal and bilateral Tube secured with: Tape Dental Injury: Teeth and Oropharynx as per pre-operative assessment

## 2018-03-01 NOTE — Transfer of Care (Signed)
Immediate Anesthesia Transfer of Care Note  Patient: Randy [name]  Procedure(s) Performed: ARTHROSCOPY KNEE (Right )  Patient Location: PACU  Anesthesia Type:General and Regional  Level of Consciousness: drowsy and responds to stimulation  Airway & Oxygen Therapy: Patient Spontanous Breathing and Patient connected to nasal cannula oxygen  Post-op Assessment: Report given to RN and Post -op Vital signs reviewed and stable  Post vital signs: Reviewed and stable  Last Vitals:  Vitals Value Taken Time  BP 136/63 03/01/2018 12:54 PM  Temp    Pulse 68 03/01/2018 12:58 PM  Resp 29 03/01/2018 12:58 PM  SpO2 100 % 03/01/2018 12:58 PM  Vitals shown include unvalidated device data.  Last Pain:  Vitals:   03/01/18 0900  TempSrc: Oral  PainSc: 3       Patients Stated Pain Goal: 6 (99/77/41 4239)  Complications: No apparent anesthesia complications

## 2018-03-01 NOTE — Op Note (Signed)
Pre-Op Dx: Right knee medial meniscal tear, chondrocalcinosis  Postop Dx: Right knee medial and lateral meniscal tears with chondrocalcinosis  Procedure: Right knee arthroscopic partial medial lateral meniscectomies with debridement of chondrocalcinosis from articular cartilage as well  Surgeon: Kathalene Frames. Mayer Camel M.D.  Assist: Kerry Hough. Barton Dubois  (present throughout entire procedure and necessary for timely completion of the procedure) Anes: General LMA  EBL: Minimal  Fluids: 800 cc   Indications: 84 year old man with catching popping and pain in his right knee plain radiograph showed chondrocalcinosis. Pt has failed conservative treatment with anti-inflammatory medicines, physical therapy, and modified activites but did get good temporarily from an intra-articular cortisone injection. Pain has recurred and patient desires elective arthroscopic evaluation and treatment of knee. Risks and benefits of surgery have been discussed and questions answered.  Procedure: Patient identified by arm band and taken to the operating room at the day surgery Center. The appropriate anesthetic monitors were attached, and General LMA anesthesia was induced without difficulty. Lateral post was applied to the table and the lower extremity was prepped and draped in usual sterile fashion from the ankle to the midthigh. Time out procedure was performed. We began the operation by making standard inferior lateral and inferior medial peripatellar portals with a #11 blade allowing introduction of the arthroscope through the inferior lateral portal and the out flow to the inferior medial portal. Pump pressure was set at 100 mmHg and diagnostic arthroscopy  revealed the patellofemoral joint was pristine.  Moving of the medial compartment we did identify degenerative tearing of pretty much the entire posterior and medial horns of the medial meniscus started with nodules of chondrocalcinosis.  This is debrided back to a stable  margin using a small straight biter and a 3 5 Gator sucker shaver.  The articular cartilage near the notch also has some chondrocalcinosis that was lightly debrided.  Cruciate ligaments intact but there was a bundle of the ACL that was entirely involved with a chondrocalcinosis likewise debrided removing about 10% of the fibers.  The lateral meniscus had similar changes and was likewise debrided the articular cartilage on the lateral side had no chondrocalcinosis.  The gutters were cleared medially and laterally.  There were some small bits of articular cartilage floating around the joint fluid there were taken through the outflow.. The knee was irrigated out normal saline solution. A dressing of xerofoam 4 x 4 dressing sponges, web roll and an Ace wrap was applied. The patient was awakened extubated and taken to the recovery without difficulty.    Signed: Kerin Salen, MD

## 2018-03-01 NOTE — Discharge Instructions (Addendum)
Knee Arthroscopy, Care After Refer to this sheet in the next few weeks. These instructions provide you with information about caring for yourself after your procedure. Your health care provider may also give you more specific instructions. Your treatment has been planned according to current medical practices, but problems sometimes occur. Call your health care provider if you have any problems or questions after your procedure. What can I expect after the procedure? After the procedure, it is common to have:  Soreness.  Pain.  Follow these instructions at home: Bathing  Do not take baths, swim, or use a hot tub until your health care provider approves. Incision care  There are many different ways to close and cover an incision, including stitches, skin glue, and adhesive strips. Follow your health care providers instructions about: ? Incision care. ? Bandage (dressing) changes and removal. ? Incision closure removal.  Check your incision area every day for signs of infection. Watch for: ? Redness, swelling, or pain. ? Fluid, blood, or pus. Activity  Avoid strenuous activities for as long as directed by your health care provider.  Return to your normal activities as directed by your health care provider. Ask your health care provider what activities are safe for you.  Perform range-of-motion exercises only as directed by your health care provider.  Do not lift anything that is heavier than 10 lb (4.5 kg).  Do not drive or operate heavy machinery while taking pain medicine.  If you were given crutches, use them as directed by your health care provider. Managing pain, stiffness, and swelling  If directed, apply ice to the injured area: ? Put ice in a plastic bag. ? Place a towel between your skin and the bag. ? Leave the ice on for 20 minutes, 2-3 times per day.  Raise the injured area above the level of your heart while you are sitting or lying down as directed by your  health care provider. General instructions  Keep all follow-up visits as directed by your health care provider. This is important.  Take medicines only as directed by your health care provider.  Do not use any tobacco products, including cigarettes, chewing tobacco, or electronic cigarettes. If you need help quitting, ask your health care provider.  If you were given compression stockings, wear them as directed by your health care provider. These stockings help prevent blood clots and reduce swelling in your legs. Contact a health care provider if:  You have severe pain with any movement of your knee.  You notice a bad smell coming from the incision or dressing.  You have redness, swelling, or pain at the site of your incision.  You have fluid, blood, or pus coming from your incision. Get help right away if:  You develop a rash.  You have a fever.  You have difficulty breathing or have shortness of breath.  You develop pain in your calves or in the back of your knee.  You develop chest pain.  You develop numbness or tingling in your leg or foot. This information is not intended to replace advice given to you by your health care provider. Make sure you discuss any questions you have with your health care provider.   Post Anesthesia Home Care Instructions  Activity: Get plenty of rest for the remainder of the day. A responsible individual must stay with you for 24 hours following the procedure.  For the next 24 hours, DO NOT: -Drive a car -Paediatric nurse -Drink alcoholic beverages -Take any  medication unless instructed by your physician -Make any legal decisions or sign important papers.  Meals: Start with liquid foods such as gelatin or soup. Progress to regular foods as tolerated. Avoid greasy, spicy, heavy foods. If nausea and/or vomiting occur, drink only clear liquids until the nausea and/or vomiting subsides. Call your physician if vomiting continues.  Special  Instructions/Symptoms: Your throat may feel dry or sore from the anesthesia or the breathing tube placed in your throat during surgery. If this causes discomfort, gargle with warm salt water. The discomfort should disappear within 24 hours.  If you had a scopolamine patch placed behind your ear for the management of post- operative nausea and/or vomiting:  1. The medication in the patch is effective for 72 hours, after which it should be removed.  Wrap patch in a tissue and discard in the trash. Wash hands thoroughly with soap and water. 2. You may remove the patch earlier than 72 hours if you experience unpleasant side effects which may include dry mouth, dizziness or visual disturbances. 3. Avoid touching the patch. Wash your hands with soap and water after contact with the patch.

## 2018-03-01 NOTE — Anesthesia Procedure Notes (Signed)
Anesthesia Regional Block: Knee block   Pre-Anesthetic Checklist: ,, timeout performed, Correct Patient, Correct Site, Correct Laterality, Correct Procedure, Correct Position, site marked, Risks and benefits discussed,  Surgical consent,  Pre-op evaluation,  At surgeon's request and post-op pain management  Laterality: Right  Prep: chloraprep       Needles:  Injection technique: Single-shot  Needle Type: Echogenic Stimulator Needle     Needle Length: 5cm  Needle Gauge: 22     Additional Needles:   Procedures:, nerve stimulator,,, ultrasound used (permanent image in chart),,,,  Narrative:  Start time: 03/01/2018 9:52 AM End time: 03/01/2018 9:58 AM Injection made incrementally with aspirations every 5 mL.  Performed by: Personally  Anesthesiologist: Janeece Riggers, MD  Additional Notes: Functioning IV was confirmed and monitors were applied.  A 35mm 22ga Arrow echogenic stimulator needle was used. Sterile prep and drape,hand hygiene and sterile gloves were used. Ultrasound guidance: relevant anatomy identified, needle position confirmed, local anesthetic spread visualized around nerve(s)., vascular puncture avoided.  Image printed for medical record. Negative aspiration and negative test dose prior to incremental administration of local anesthetic. The patient tolerated the procedure well.

## 2018-03-02 ENCOUNTER — Encounter (HOSPITAL_COMMUNITY): Payer: Self-pay | Admitting: Orthopedic Surgery

## 2018-03-07 NOTE — Anesthesia Postprocedure Evaluation (Signed)
Anesthesia Post Note  Patient: Randy [name]  Procedure(s) Performed: ARTHROSCOPY KNEE (Right )     Patient location during evaluation: PACU Anesthesia Type: General Level of consciousness: awake Pain management: pain level controlled Vital Signs Assessment: post-procedure vital signs reviewed and stable Respiratory status: spontaneous breathing Cardiovascular status: stable Postop Assessment: no apparent nausea or vomiting Anesthetic complications: no    Last Vitals:  Vitals:   03/01/18 1410 03/01/18 1425  BP: 134/65 (!) 135/54  Pulse: (!) 58 (!) 58  Resp: 17 18  Temp: 36.6 C   SpO2: 97% 99%    Last Pain:  Vitals:   03/01/18 1425  TempSrc:   PainSc: 0-No pain   Pain Goal: Patients Stated Pain Goal: 3 (03/01/18 1355)               [place]

## 2018-03-09 DIAGNOSIS — S83241A Other tear of medial meniscus, current injury, right knee, initial encounter: Secondary | ICD-10-CM | POA: Diagnosis not present

## 2018-03-21 DIAGNOSIS — Z9889 Other specified postprocedural states: Secondary | ICD-10-CM | POA: Diagnosis not present

## 2018-03-23 ENCOUNTER — Other Ambulatory Visit: Payer: Self-pay | Admitting: Internal Medicine

## 2018-03-24 ENCOUNTER — Encounter: Payer: Self-pay | Admitting: Internal Medicine

## 2018-03-24 ENCOUNTER — Ambulatory Visit (INDEPENDENT_AMBULATORY_CARE_PROVIDER_SITE_OTHER): Payer: Medicare Other | Admitting: Internal Medicine

## 2018-03-24 ENCOUNTER — Other Ambulatory Visit (INDEPENDENT_AMBULATORY_CARE_PROVIDER_SITE_OTHER): Payer: Medicare Other

## 2018-03-24 VITALS — BP 98/62 | HR 53 | Temp 97.7°F | Ht 68.0 in | Wt 147.0 lb

## 2018-03-24 DIAGNOSIS — I10 Essential (primary) hypertension: Secondary | ICD-10-CM

## 2018-03-24 DIAGNOSIS — G4700 Insomnia, unspecified: Secondary | ICD-10-CM | POA: Diagnosis not present

## 2018-03-24 DIAGNOSIS — Z23 Encounter for immunization: Secondary | ICD-10-CM | POA: Diagnosis not present

## 2018-03-24 DIAGNOSIS — Z0000 Encounter for general adult medical examination without abnormal findings: Secondary | ICD-10-CM | POA: Diagnosis not present

## 2018-03-24 DIAGNOSIS — E785 Hyperlipidemia, unspecified: Secondary | ICD-10-CM

## 2018-03-24 LAB — LIPID PANEL
CHOL/HDL RATIO: 4
Cholesterol: 177 mg/dL (ref 0–200)
HDL: 45.2 mg/dL (ref >39.00)
LDL CALC: 99 mg/dL (ref 0–99)
NONHDL: 131.74
TRIGLYCERIDES: 162 mg/dL — AB (ref 0.0–149.0)
VLDL: 32.4 mg/dL (ref 0.0–40.0)

## 2018-03-24 LAB — CBC
HCT: 41.1 % (ref 39.0–52.0)
Hemoglobin: 14.2 g/dL (ref 13.0–17.0)
MCHC: 34.5 g/dL (ref 30.0–36.0)
MCV: 92.4 fl (ref 78.0–100.0)
Platelets: 188 10*3/uL (ref 150.0–400.0)
RBC: 4.45 Mil/uL (ref 4.22–5.81)
RDW: 14 % (ref 11.5–15.5)
WBC: 5.2 10*3/uL (ref 4.0–10.5)

## 2018-03-24 LAB — COMPREHENSIVE METABOLIC PANEL
ALT: 9 U/L (ref 0–53)
AST: 16 U/L (ref 0–37)
Albumin: 4.1 g/dL (ref 3.5–5.2)
Alkaline Phosphatase: 54 U/L (ref 39–117)
BUN: 20 mg/dL (ref 6–23)
CHLORIDE: 105 meq/L (ref 96–112)
CO2: 27 meq/L (ref 19–32)
CREATININE: 1.04 mg/dL (ref 0.40–1.50)
Calcium: 9.7 mg/dL (ref 8.4–10.5)
GFR: 71.96 mL/min (ref >60.00)
GLUCOSE: 98 mg/dL (ref 70–99)
Potassium: 4.6 mEq/L (ref 3.5–5.1)
Sodium: 140 mEq/L (ref 135–145)
Total Bilirubin: 0.6 mg/dL (ref 0.2–1.2)
Total Protein: 6.7 g/dL (ref 6.0–8.3)

## 2018-03-24 MED ORDER — LINACLOTIDE 145 MCG PO CAPS: 145.0000 ug | ORAL_CAPSULE | Freq: Every day | ORAL | 1 refills | Status: DC

## 2018-03-24 NOTE — Assessment & Plan Note (Signed)
BP at goal on metoprolol and checking CMP and adjust as needed.  

## 2018-03-24 NOTE — Assessment & Plan Note (Signed)
Refill ambien as needed. Uses prn for sleep problems.

## 2018-03-24 NOTE — Patient Instructions (Addendum)
Call us if you have any lightheadedness or dizziness with the blood pressure.   We have sent in linzess for the bowels. This medicine helps you to be more regular when you take it daily. There is a low dose if you get too many stools with this and also a high dose if you are not helped enough. Let us know if you need to change doses.    Health Maintenance, Male A healthy lifestyle and preventive care is important for your health and wellness. Ask your health care provider about what schedule of regular examinations is right for you. What should I know about weight and diet? Eat a Healthy Diet  Eat plenty of vegetables, fruits, whole grains, low-fat dairy products, and lean protein.  Do not eat a lot of foods high in solid fats, added sugars, or salt.  Maintain a Healthy Weight Regular exercise can help you achieve or maintain a healthy weight. You should:  Do at least 150 minutes of exercise each week. The exercise should increase your heart rate and make you sweat (moderate-intensity exercise).  Do strength-training exercises at least twice a week.  Watch Your Levels of Cholesterol and Blood Lipids  Have your blood tested for lipids and cholesterol every 5 years starting at 82 years of age. If you are at high risk for heart disease, you should start having your blood tested when you are 82 years old. You may need to have your cholesterol levels checked more often if: ? Your lipid or cholesterol levels are high. ? You are older than 82 years of age. ? You are at high risk for heart disease.  What should I know about cancer screening? Many types of cancers can be detected early and may often be prevented. Lung Cancer  You should be screened every year for lung cancer if: ? You are a current smoker who has smoked for at least 30 years. ? You are a former smoker who has quit within the past 15 years.  Talk to your health care provider about your screening options, when you should  start screening, and how often you should be screened.  Colorectal Cancer  Routine colorectal cancer screening usually begins at 82 years of age and should be repeated every 5-10 years until you are 82 years old. You may need to be screened more often if early forms of precancerous polyps or small growths are found. Your health care provider may recommend screening at an earlier age if you have risk factors for colon cancer.  Your health care provider may recommend using home test kits to check for hidden blood in the stool.  A small camera at the end of a tube can be used to examine your colon (sigmoidoscopy or colonoscopy). This checks for the earliest forms of colorectal cancer.  Prostate and Testicular Cancer  Depending on your age and overall health, your health care provider may do certain tests to screen for prostate and testicular cancer.  Talk to your health care provider about any symptoms or concerns you have about testicular or prostate cancer.  Skin Cancer  Check your skin from head to toe regularly.  Tell your health care provider about any new moles or changes in moles, especially if: ? There is a change in a mole's size, shape, or color. ? You have a mole that is larger than a pencil eraser.  Always use sunscreen. Apply sunscreen liberally and repeat throughout the day.  Protect yourself by wearing long sleeves,  pants, a wide-brimmed hat, and sunglasses when outside.  What should I know about heart disease, diabetes, and high blood pressure?  If you are 16-62 years of age, have your blood pressure checked every 3-5 years. If you are 30 years of age or older, have your blood pressure checked every year. You should have your blood pressure measured twice-once when you are at a hospital or clinic, and once when you are not at a hospital or clinic. Record the average of the two measurements. To check your blood pressure when you are not at a hospital or clinic, you can  use: ? An automated blood pressure machine at a pharmacy. ? A home blood pressure monitor.  Talk to your health care provider about your target blood pressure.  If you are between 42-34 years old, ask your health care provider if you should take aspirin to prevent heart disease.  Have regular diabetes screenings by checking your fasting blood sugar level. ? If you are at a normal weight and have a low risk for diabetes, have this test once every three years after the age of 28. ? If you are overweight and have a high risk for diabetes, consider being tested at a younger age or more often.  A one-time screening for abdominal aortic aneurysm (AAA) by ultrasound is recommended for men aged 6-75 years who are current or former smokers. What should I know about preventing infection? Hepatitis B If you have a higher risk for hepatitis B, you should be screened for this virus. Talk with your health care provider to find out if you are at risk for hepatitis B infection. Hepatitis C Blood testing is recommended for:  Everyone born from [birth year] through 1963.  Anyone with known risk factors for hepatitis C.  Sexually Transmitted Diseases (STDs)  You should be screened each year for STDs including gonorrhea and chlamydia if: ? You are sexually active and are younger than 82 years of age. ? You are older than 82 years of age and your health care provider tells you that you are at risk for this type of infection. ? Your sexual activity has changed since you were last screened and you are at an increased risk for chlamydia or gonorrhea. Ask your health care provider if you are at risk.  Talk with your health care provider about whether you are at high risk of being infected with HIV. Your health care provider may recommend a prescription medicine to help prevent HIV infection.  What else can I do?  Schedule regular health, dental, and eye exams.  Stay current with your vaccines  (immunizations).  Do not use any tobacco products, such as cigarettes, chewing tobacco, and e-cigarettes. If you need help quitting, ask your health care provider.  Limit alcohol intake to no more than 2 drinks per day. One drink equals 12 ounces of beer, 5 ounces of wine, or 1 ounces of hard liquor.  Do not use street drugs.  Do not share needles.  Ask your health care provider for help if you need support or information about quitting drugs.  Tell your health care provider if you often feel depressed.  Tell your health care provider if you have ever been abused or do not feel safe at home. This information is not intended to replace advice given to you by your health care provider. Make sure you discuss any questions you have with your health care provider. Document Released: 04/22/2008 Document Revised: 06/23/2016 Document Reviewed: 07/29/2015 Elsevier  Interactive Patient Education  [employer].

## 2018-03-24 NOTE — Assessment & Plan Note (Signed)
Checking lipid panel and adjust as needed.  

## 2018-03-24 NOTE — Progress Notes (Signed)
   Subjective:    Patient ID: Randy [name], male    DOB: 02-21-1930, 84 y.o.   MRN: [id number]  HPI Here for medicare wellness and physical, no new complaints. Please see A/P for status and treatment of chronic medical problems.   Diet: heart healthy Physical activity:active Depression/mood screen: negative Hearing: moderate loss bilateral Visual acuity: grossly normal with lens, performs annual eye exam  ADLs: capable Fall risk: none Home safety: good Cognitive evaluation: intact to orientation, naming, recall and repetition EOL planning: adv directives discussed  I have personally reviewed and have noted 1. The patient's medical and social history - reviewed today no changes 2. Their use of alcohol, tobacco or illicit drugs 3. Their current medications and supplements 4. The patient's functional ability including ADL's, fall risks, home safety risks and hearing or visual impairment. 5. Diet and physical activities 6. Evidence for depression or mood disorders 7. Care team reviewed and updated (available in snapshot)  Review of Systems  Constitutional: Negative.   HENT: Negative.   Eyes: Negative.   Respiratory: Negative for cough, chest tightness and shortness of breath.   Cardiovascular: Negative for chest pain, palpitations and leg swelling.  Gastrointestinal: Negative for abdominal distention, abdominal pain, constipation, diarrhea, nausea and vomiting.  Musculoskeletal: Negative.   Skin: Negative.   Neurological: Negative.   Psychiatric/Behavioral: Negative.       Objective:   Physical Exam  Constitutional: He is oriented to person, place, and time. He appears well-developed and well-nourished.  HENT:  Head: Normocephalic and atraumatic.  Eyes: EOM are normal.  Neck: Normal range of motion.  Cardiovascular: Normal rate and regular rhythm.  Pulmonary/Chest: Effort normal and breath sounds normal. No respiratory distress. He has no wheezes. He has no rales.    Abdominal: Soft. Bowel sounds are normal. He exhibits no distension. There is no tenderness. There is no rebound.  Musculoskeletal: He exhibits no edema.  Neurological: He is alert and oriented to person, place, and time. Coordination normal.  Skin: Skin is warm and dry.  Psychiatric: He has a normal mood and affect.   Vitals:   03/24/18 1030  BP: 98/62  Pulse: (!) 53  Temp: 97.7 F (36.5 C)  TempSrc: Oral  SpO2: 98%  Weight: 147 lb (66.7 kg)  Height: 5\' 8"  (1.727 m)      Assessment & Plan:  Tdap given at visit

## 2018-03-24 NOTE — Assessment & Plan Note (Signed)
Flu shot yearly. Pneumonia done. Shingrix counseled. Tetanus up to date. Colonoscopy up to date. Counseled about sun safety and mole surveillance. Counseled about the dangers of distracted driving. Given 10 year screening recommendations.

## 2018-03-27 ENCOUNTER — Other Ambulatory Visit: Payer: Self-pay

## 2018-03-27 MED ORDER — FUROSEMIDE 40 MG PO TABS: 40.0000 mg | ORAL_TABLET | Freq: Every day | ORAL | 0 refills | Status: DC | PRN

## 2018-05-06 ENCOUNTER — Other Ambulatory Visit: Payer: Self-pay | Admitting: Internal Medicine

## 2018-05-09 NOTE — Telephone Encounter (Signed)
Control database checked last refill: 04/07/2018  LOV: 03/24/2018

## 2018-05-11 ENCOUNTER — Other Ambulatory Visit: Payer: Self-pay | Admitting: Internal Medicine

## 2018-05-13 ENCOUNTER — Other Ambulatory Visit: Payer: Self-pay | Admitting: Internal Medicine

## 2018-05-21 ENCOUNTER — Other Ambulatory Visit: Payer: Self-pay | Admitting: Internal Medicine

## 2018-06-20 LAB — PSA: PSA: 6.29

## 2018-06-27 DIAGNOSIS — C61 Malignant neoplasm of prostate: Secondary | ICD-10-CM | POA: Diagnosis not present

## 2018-06-28 ENCOUNTER — Encounter: Payer: Self-pay | Admitting: Internal Medicine

## 2018-06-28 LAB — TESTOSTERONE, TOTAL AND FREE DIRECT MEASURE: Testosterone: 12.2

## 2018-07-17 ENCOUNTER — Other Ambulatory Visit: Payer: Self-pay | Admitting: Internal Medicine

## 2018-08-17 ENCOUNTER — Ambulatory Visit (INDEPENDENT_AMBULATORY_CARE_PROVIDER_SITE_OTHER): Payer: Medicare Other | Admitting: *Deleted

## 2018-08-17 DIAGNOSIS — Z23 Encounter for immunization: Secondary | ICD-10-CM | POA: Diagnosis not present

## 2018-10-30 ENCOUNTER — Other Ambulatory Visit (HOSPITAL_COMMUNITY): Payer: Self-pay | Admitting: Urology

## 2018-10-30 ENCOUNTER — Other Ambulatory Visit: Payer: Self-pay | Admitting: Urology

## 2018-10-30 DIAGNOSIS — C61 Malignant neoplasm of prostate: Secondary | ICD-10-CM

## 2018-11-03 ENCOUNTER — Telehealth: Payer: Self-pay | Admitting: Oncology

## 2018-11-03 ENCOUNTER — Encounter: Payer: Self-pay | Admitting: Oncology

## 2018-11-03 NOTE — Telephone Encounter (Signed)
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 Pt has been scheduled to see Dr. Alen Blew on 1/14 at 11am. Letter mailed.

## 2018-11-09 ENCOUNTER — Other Ambulatory Visit: Payer: Self-pay | Admitting: Internal Medicine

## 2018-11-09 NOTE — Telephone Encounter (Signed)
Called pharmacy last refill 10/10/2018 LOV: 03/24/2018 MMO:CARE

## 2018-11-13 DIAGNOSIS — Z5111 Encounter for antineoplastic chemotherapy: Secondary | ICD-10-CM | POA: Diagnosis not present

## 2018-11-16 ENCOUNTER — Telehealth: Payer: Self-pay | Admitting: Internal Medicine

## 2018-11-16 MED ORDER — SERTRALINE HCL 50 MG PO TABS: 50.0000 mg | ORAL_TABLET | Freq: Every morning | ORAL | 1 refills | Status: DC

## 2018-11-16 NOTE — Telephone Encounter (Signed)
Patient informed of MD response and stated understanding  

## 2018-11-16 NOTE — Telephone Encounter (Signed)
Double dose to 50 mg daily. New rx sent in. Can take 2 pills daily of pills he has left until gone. Should see improvement in 2-4 weeks.

## 2018-11-16 NOTE — Addendum Note (Signed)
Addended by: Pricilla Holm A on: 11/16/2018 01:41 PM   Modules accepted: Orders

## 2018-11-16 NOTE — Telephone Encounter (Signed)
Copied from [place] [phone]. Topic: General - Other >> Nov 16, 2018 10:37 AM Windy Kalata wrote: Reason for CRM: Wife called and he is on Zoloft Dr. Sharlet Salina knows he has prostate cancer, he is having problems sleeping, he takes Ambien at night. She would like to know if Dr. Sharlet Salina can increase the dosage of his Zoloft, he is very irritable and she states that this is not his nature, please advise.  Call back is [phone]

## 2018-11-17 ENCOUNTER — Telehealth: Payer: Self-pay | Admitting: Internal Medicine

## 2018-11-17 ENCOUNTER — Encounter (HOSPITAL_COMMUNITY)
Admission: RE | Admit: 2018-11-17 | Discharge: 2018-11-17 | Disposition: A | Discharge status: Home / Self Care | Payer: Medicare Other | Source: Ambulatory Visit | Attending: Urology | Admitting: Urology

## 2018-11-17 ENCOUNTER — Telehealth: Payer: Self-pay | Admitting: Oncology

## 2018-11-17 DIAGNOSIS — C61 Malignant neoplasm of prostate: Secondary | ICD-10-CM | POA: Diagnosis present

## 2018-11-17 MED ORDER — TECHNETIUM TC 99M MEDRONATE IV KIT
20.0000 | PACK | Freq: Once | INTRAVENOUS | Status: AC | PRN
  Administered 2018-11-17: 20 via INTRAVENOUS

## 2018-11-17 NOTE — Telephone Encounter (Signed)
Pt cld to reschedule appt to see Dr. Alen Blew on 1/30 at 2pm.

## 2018-11-17 NOTE — Telephone Encounter (Signed)
Received a new referral from [facility] for elevated m protein. Pt has been cld and scheduled to see Dr. Julien Nordmann on 1/18 a 915am. Pt aware to arrive 30 minutes early. Letter mailed.

## 2018-11-21 ENCOUNTER — Inpatient Hospital Stay: Payer: Medicare Other | Admitting: Oncology

## 2018-12-05 DIAGNOSIS — K6389 Other specified diseases of intestine: Secondary | ICD-10-CM | POA: Diagnosis not present

## 2018-12-07 ENCOUNTER — Inpatient Hospital Stay: Payer: Medicare Other | Admitting: Oncology

## 2018-12-07 ENCOUNTER — Telehealth: Payer: Self-pay | Admitting: *Deleted

## 2018-12-07 NOTE — Telephone Encounter (Signed)
Patient calling to cancel his appt today. States he has been in the E.R. with his wife all night. He needs to sleep. Patient wanted to r/s his appt for after his upcoming colonoscopy. He will call us when colonoscopy is scheduled.

## 2018-12-19 DIAGNOSIS — S62307D Unspecified fracture of fifth metacarpal bone, left hand, subsequent encounter for fracture with routine healing: Secondary | ICD-10-CM | POA: Diagnosis not present

## 2018-12-21 DIAGNOSIS — K5730 Diverticulosis of large intestine without perforation or abscess without bleeding: Secondary | ICD-10-CM | POA: Diagnosis not present

## 2018-12-21 DIAGNOSIS — D125 Benign neoplasm of sigmoid colon: Secondary | ICD-10-CM | POA: Diagnosis not present

## 2018-12-21 DIAGNOSIS — K648 Other hemorrhoids: Secondary | ICD-10-CM | POA: Diagnosis not present

## 2018-12-21 DIAGNOSIS — K6389 Other specified diseases of intestine: Secondary | ICD-10-CM | POA: Diagnosis not present

## 2018-12-21 DIAGNOSIS — D122 Benign neoplasm of ascending colon: Secondary | ICD-10-CM | POA: Diagnosis not present

## 2018-12-21 DIAGNOSIS — K635 Polyp of colon: Secondary | ICD-10-CM | POA: Diagnosis not present

## 2018-12-21 DIAGNOSIS — R933 Abnormal findings on diagnostic imaging of other parts of digestive tract: Secondary | ICD-10-CM | POA: Diagnosis not present

## 2018-12-21 DIAGNOSIS — D120 Benign neoplasm of cecum: Secondary | ICD-10-CM | POA: Diagnosis not present

## 2018-12-21 LAB — HM COLONOSCOPY

## 2018-12-22 DIAGNOSIS — D120 Benign neoplasm of cecum: Secondary | ICD-10-CM | POA: Diagnosis not present

## 2018-12-22 DIAGNOSIS — D125 Benign neoplasm of sigmoid colon: Secondary | ICD-10-CM | POA: Diagnosis not present

## 2018-12-22 DIAGNOSIS — D122 Benign neoplasm of ascending colon: Secondary | ICD-10-CM | POA: Diagnosis not present

## 2018-12-26 ENCOUNTER — Ambulatory Visit: Payer: Medicare Other | Admitting: Internal Medicine

## 2018-12-27 ENCOUNTER — Encounter: Payer: Self-pay | Admitting: Internal Medicine

## 2019-01-17 ENCOUNTER — Telehealth: Payer: Self-pay | Admitting: Oncology

## 2019-01-17 NOTE — Telephone Encounter (Signed)
Pt cld to reschedule medonc appt w/Dr..Shadad. Appt scheduled for 3/20 at 2pm. Randy [name] is aware to arrive 30 minutes early.

## 2019-01-24 ENCOUNTER — Telehealth: Payer: Self-pay | Admitting: Oncology

## 2019-01-24 NOTE — Telephone Encounter (Signed)
Rescheduled new patient apt per 3/17 sch message from Dr. Alen Blew - patient is aware of appt date and time

## 2019-01-26 ENCOUNTER — Inpatient Hospital Stay: Payer: Medicare Other | Admitting: Oncology

## 2019-01-29 DIAGNOSIS — R195 Other fecal abnormalities: Secondary | ICD-10-CM | POA: Diagnosis not present

## 2019-01-31 ENCOUNTER — Other Ambulatory Visit: Payer: Self-pay | Admitting: Internal Medicine

## 2019-02-23 ENCOUNTER — Inpatient Hospital Stay: Payer: Medicare Other | Attending: Oncology | Admitting: Oncology

## 2019-02-23 ENCOUNTER — Telehealth: Payer: Self-pay | Admitting: Oncology

## 2019-02-23 ENCOUNTER — Other Ambulatory Visit: Payer: Self-pay

## 2019-02-23 VITALS — BP 112/58 | HR 52 | Temp 97.8°F | Resp 18 | Wt 148.1 lb

## 2019-02-23 DIAGNOSIS — Z9079 Acquired absence of other genital organ(s): Secondary | ICD-10-CM

## 2019-02-23 DIAGNOSIS — R9721 Rising PSA following treatment for malignant neoplasm of prostate: Secondary | ICD-10-CM | POA: Diagnosis not present

## 2019-02-23 DIAGNOSIS — Z79890 Hormone replacement therapy (postmenopausal): Secondary | ICD-10-CM | POA: Diagnosis not present

## 2019-02-23 DIAGNOSIS — Z8639 Personal history of other endocrine, nutritional and metabolic disease: Secondary | ICD-10-CM

## 2019-02-23 DIAGNOSIS — Z8679 Personal history of other diseases of the circulatory system: Secondary | ICD-10-CM | POA: Diagnosis not present

## 2019-02-23 DIAGNOSIS — C61 Malignant neoplasm of prostate: Secondary | ICD-10-CM

## 2019-02-23 DIAGNOSIS — Z8546 Personal history of malignant neoplasm of prostate: Secondary | ICD-10-CM

## 2019-02-23 NOTE — Telephone Encounter (Signed)
No los but scheduled appt per 4/17 sch message,

## 2019-02-23 NOTE — Progress Notes (Signed)
Reason for the request:    Prostate cancer  HPI: I was asked by Dr. Alinda Money to evaluate Randy [name] for evaluation of prostate cancer.  He is a 85 year old man with history of coronary disease and hyperlipidemia was diagnosed with prostate cancer in 1991.  At that time he underwent a radical prostatectomy for a Gleason score 4+3 = 7 and a PSA of 6.8.  He subsequently developed a biochemical relapse and required androgen deprivation therapy prior 2016.  He subsequently developed castration resistant disease with a rise in his PSA up to 6.63 in July 2018.  Apalutamide was started and he has been on it since that time.  His nadir of PSA was 3.06 in February 2019.  His PSA was up to 6.29 in August 2019 and in December 2019 was up to 7.87.  Staging work-up including bone scan obtained on January 10 of 2020 showed no evidence of disease.  Medically, he reports no major complaints at this time.  He denies any bone pain, back pain or changes in his performance status.  He remains active and attends to activities of daily living.  He denies any urinary difficulties.   He does not report any headaches, blurry vision, syncope or seizures. Does not report any fevers, chills or sweats.  Does not report any cough, wheezing or hemoptysis.  Does not report any chest pain, palpitation, orthopnea or leg edema.  Does not report any nausea, vomiting or abdominal pain.  Does not report any constipation or diarrhea.  Does not report any skeletal complaints.    Does not report frequency, urgency or hematuria.  Does not report any skin rashes or lesions. Does not report any heat or cold intolerance.  Does not report any lymphadenopathy or petechiae.  Does not report any anxiety or depression.  Remaining review of systems is negative.    Past Medical History:  Diagnosis Date  . Acute MI ([place])   . Arthritis   . Cancer [facility])    prostate  . Coronary artery disease   . GERD (gastroesophageal reflux disease)   . Hyperlipidemia    . Hypertension   . Wears glasses   :  Past Surgical History:  Procedure Laterality Date  . CORONARY ANGIOPLASTY     11/30/94 (Dr. Daneen Schick): Mild ant/anteroapical hypokinesis (known ant MI '94), EF 60%. 50-60%pLAD, 60% RI, 50% oRCA, 90% OM1 (s/p PTCA '96)  . KNEE ARTHROSCOPY Right 03/01/2018   Procedure: ARTHROSCOPY KNEE;  Surgeon: Frederik Pear, MD;  Location: [place];  Service: Orthopedics;  Laterality: Right;  debridement of medial and lateral meniscal tears  :   Current Outpatient Medications:  .  ASPERCREME LIDOCAINE EX, Apply 1 application topically daily as needed (pain)., Disp: , Rfl:  .  aspirin 325 MG EC tablet, Take 325 mg by mouth every morning., Disp: , Rfl:  .  Calcium Carb-Cholecalciferol (CALCIUM 1000 + D PO), Take 1 tablet by mouth daily., Disp: , Rfl:  .  ERLEADA 60 MG tablet, Take 240 mg by mouth daily at 12 noon. , Disp: , Rfl:  .  fish oil-omega-3 fatty acids 1000 MG capsule, Take 1 g by mouth 2 (two) times daily. , Disp: , Rfl:  .  fluticasone (FLONASE) 50 MCG/ACT nasal spray, SPRAY 2 SPRAYS INTO EACH NOSTRIL EVERY DAY, Disp: 16 g, Rfl: 2 .  furosemide (LASIX) 40 MG tablet, TAKE 1 TABLET (40 MG TOTAL) BY MOUTH DAILY AS NEEDED., Disp: 15 tablet, Rfl: 1 .  linaclotide (LINZESS) 145 MCG  CAPS capsule, Take 1 capsule (145 mcg total) by mouth daily before breakfast., Disp: 90 capsule, Rfl: 1 .  metoprolol tartrate (LOPRESSOR) 50 MG tablet, TAKE 1 TABLET BY MOUTH TWICE A DAY, Disp: 180 tablet, Rfl: 2 .  niacin 500 MG tablet, Take 500 mg by mouth at bedtime., Disp: , Rfl:  .  nitroGLYCERIN (NITROSTAT) 0.4 MG SL tablet, Place 1 tablet (0.4 mg total) under the tongue every 5 (five) minutes as needed for chest pain., Disp: 25 tablet, Rfl: 1 .  sertraline (ZOLOFT) 50 MG tablet, Take 1 tablet (50 mg total) by mouth every morning., Disp: 90 tablet, Rfl: 1 .  simvastatin (ZOCOR) 20 MG tablet, TAKE 1 TABLET (20 MG TOTAL) BY MOUTH AT BEDTIME. NEED ANNUAL APPOINTMENT FOR FURTHER  REFILLS, Disp: 100 tablet, Rfl: 3 .  traMADol (ULTRAM) 50 MG tablet, , Disp: , Rfl:  .  zolpidem (AMBIEN) 10 MG tablet, TAKE 1 TABLET BY MOUTH EVERY DAY AT BEDTIME SLEEP, Disp: 30 tablet, Rfl: 3:  Allergies  Allergen Reactions  . Nsaids     Had a heart attack, so can not take NSAIDS  :  No family history on file.:  Social History   Socioeconomic History  . Marital status: Married    Spouse name: Not on file  . Number of children: Not on file  . Years of education: Not on file  . Highest education level: Not on file  Occupational History  . Not on file  Social Needs  . Financial resource strain: Not on file  . Food insecurity:    Worry: Not on file    Inability: Not on file  . Transportation needs:    Medical: Not on file    Non-medical: Not on file  Tobacco Use  . Smoking status: Never Smoker  . Smokeless tobacco: Never Used  Substance and Sexual Activity  . Alcohol use: No  . Drug use: No  . Sexual activity: Not on file  Lifestyle  . Physical activity:    Days per week: Not on file    Minutes per session: Not on file  . Stress: Not on file  Relationships  . Social connections:    Talks on phone: Not on file    Gets together: Not on file    Attends religious service: Not on file    Active member of club or organization: Not on file    Attends meetings of clubs or organizations: Not on file    Relationship status: Not on file  . Intimate partner violence:    Fear of current or ex partner: Not on file    Emotionally abused: Not on file    Physically abused: Not on file    Forced sexual activity: Not on file  Other Topics Concern  . Not on file  Social History Narrative  . Not on file  :  Pertinent items are noted in HPI.  Exam: Blood pressure (!) 112/58, pulse (!) 52, temperature 97.8 F (36.6 C), temperature source Oral, resp. rate 18, weight 148 lb 1.6 oz (67.2 kg), SpO2 100 %.  ECOG 1 General appearance: alert and cooperative appeared without  distress. Head: atraumatic without any abnormalities. Eyes: conjunctivae/corneas clear. PERRL.  Sclera anicteric. Throat: lips, mucosa, and tongue normal; without oral thrush or ulcers. Resp: clear to auscultation bilaterally without rhonchi, wheezes or dullness to percussion. Cardio: regular rate and rhythm, S1, S2 normal, no murmur, click, rub or gallop GI: soft, non-tender; bowel sounds normal; no masses,  no organomegaly Skin: Skin color, texture, turgor normal. No rashes or lesions Lymph nodes: Cervical, supraclavicular, and axillary nodes normal. Neurologic: Grossly normal without any motor, sensory or deep tendon reflexes. Musculoskeletal: No joint deformity or effusion.  CBC    Component Value Date/Time   WBC 5.2 03/24/2018 1212   RBC 4.45 03/24/2018 1212   HGB 14.2 03/24/2018 1212   HCT 41.1 03/24/2018 1212   PLT 188.0 03/24/2018 1212   MCV 92.4 03/24/2018 1212   MCH 31.7 03/01/2018 0834   MCHC 34.5 03/24/2018 1212   RDW 14.0 03/24/2018 1212   LYMPHSABS 2.0 02/10/2015 2043   MONOABS 0.4 02/10/2015 2043   EOSABS 0.1 02/10/2015 2043   BASOSABS 0.0 02/10/2015 2043     Chemistry      Component Value Date/Time   NA 140 03/24/2018 1212   K 4.6 03/24/2018 1212   CL 105 03/24/2018 1212   CO2 27 03/24/2018 1212   BUN 20 03/24/2018 1212   CREATININE 1.04 03/24/2018 1212      Component Value Date/Time   CALCIUM 9.7 03/24/2018 1212   ALKPHOS 54 03/24/2018 1212   AST 16 03/24/2018 1212   ALT 9 03/24/2018 1212   BILITOT 0.6 03/24/2018 1212       Assessment and Plan:    85 year old man with the following:  1.  Prostate cancer diagnosed in 1991 with a Gleason score of 4+3 equal 7 and a PSA of 6.8.  He developed a recurrent disease and was treated with androgen deprivation therapy and subsequently apalutamide was added in July 2018.  His most recent PSA in December 2019 was up to 7.87 which is slow rise from 6.29 in August.  The natural course of this disease was  discussed today with the patient and treatment options were reviewed.  Given the indolent nature of his disease and the slow rise in his PSA, I do not recommend any changes in his treatment.  Treatment options for castration resistant disease would include systemic chemotherapy, Xofigo and other hormonal agents to a lesser degree.  At this time, I would recommend changing treatment if he develops symptomatic measurable disease progression.  2.  Androgen deprivation therapy: He remains on Lupron which I recommended continuing indefinitely.  It is unclear whether he is receiving his treatment under the care of [facility].  I will schedule him Lupron to be done in the near future I will continue to follow his PSA periodically.  3.  Follow-up: We will be in 2 months to follow his progress.  60  minutes was spent with the patient face-to-face today.  More than 50% of time was spent on reviewing his disease status, treatment options, addressing complications related to future plan of care.    Thank you for the referral.  I had the pleasure of meeting this patient today.  A copy of this consult has been forwarded to the requesting physician.

## 2019-02-24 ENCOUNTER — Other Ambulatory Visit: Payer: Self-pay | Admitting: Internal Medicine

## 2019-03-06 ENCOUNTER — Other Ambulatory Visit: Payer: Self-pay | Admitting: Internal Medicine

## 2019-03-06 NOTE — Telephone Encounter (Signed)
Will fill but see if he can do virtual in the next month for continued ambien refills

## 2019-03-06 NOTE — Telephone Encounter (Signed)
Control database checked last refill: 02/06/2019 LOV: 03/24/2018 NOV: none

## 2019-03-08 NOTE — Telephone Encounter (Signed)
Can you get patient set up for a virtual check in with Dr. Sharlet Salina in the next month so he can continue to get his Ambien. Thank you

## 2019-03-08 NOTE — Telephone Encounter (Signed)
Unable to do a virtual appt

## 2019-04-26 ENCOUNTER — Ambulatory Visit: Payer: Medicare Other | Admitting: Oncology

## 2019-05-01 ENCOUNTER — Inpatient Hospital Stay: Payer: Medicare Other | Attending: Oncology

## 2019-05-01 ENCOUNTER — Inpatient Hospital Stay: Payer: Medicare Other

## 2019-05-01 ENCOUNTER — Other Ambulatory Visit: Payer: Self-pay

## 2019-05-01 VITALS — BP 118/62 | HR 58 | Temp 98.1°F | Resp 18

## 2019-05-01 DIAGNOSIS — Z7982 Long term (current) use of aspirin: Secondary | ICD-10-CM | POA: Diagnosis not present

## 2019-05-01 DIAGNOSIS — Z79818 Long term (current) use of other agents affecting estrogen receptors and estrogen levels: Secondary | ICD-10-CM | POA: Insufficient documentation

## 2019-05-01 DIAGNOSIS — Z79899 Other long term (current) drug therapy: Secondary | ICD-10-CM | POA: Diagnosis not present

## 2019-05-01 DIAGNOSIS — Z8546 Personal history of malignant neoplasm of prostate: Secondary | ICD-10-CM

## 2019-05-01 DIAGNOSIS — C61 Malignant neoplasm of prostate: Secondary | ICD-10-CM | POA: Diagnosis present

## 2019-05-01 DIAGNOSIS — Z192 Hormone resistant malignancy status: Secondary | ICD-10-CM | POA: Insufficient documentation

## 2019-05-01 LAB — CBC WITH DIFFERENTIAL (CANCER CENTER ONLY)
Abs Immature Granulocytes: 0.02 10*3/uL (ref 0.00–0.07)
Basophils Absolute: 0 10*3/uL (ref 0.0–0.1)
Basophils Relative: 1 %
Eosinophils Absolute: 0.1 10*3/uL (ref 0.0–0.5)
Eosinophils Relative: 2 %
HCT: 38.8 % — ABNORMAL LOW (ref 39.0–52.0)
Hemoglobin: 12.7 g/dL — ABNORMAL LOW (ref 13.0–17.0)
Immature Granulocytes: 1 %
Lymphocytes Relative: 25 %
Lymphs Abs: 1.1 10*3/uL (ref 0.7–4.0)
MCH: 30.7 pg (ref 26.0–34.0)
MCHC: 32.7 g/dL (ref 30.0–36.0)
MCV: 93.7 fL (ref 80.0–100.0)
Monocytes Absolute: 0.5 10*3/uL (ref 0.1–1.0)
Monocytes Relative: 12 %
Neutro Abs: 2.7 10*3/uL (ref 1.7–7.7)
Neutrophils Relative %: 59 %
Platelet Count: 144 10*3/uL — ABNORMAL LOW (ref 150–400)
RBC: 4.14 MIL/uL — ABNORMAL LOW (ref 4.22–5.81)
RDW: 13.1 % (ref 11.5–15.5)
WBC Count: 4.4 10*3/uL (ref 4.0–10.5)
nRBC: 0 % (ref 0.0–0.2)

## 2019-05-01 LAB — CMP (CANCER CENTER ONLY)
ALT: 11 U/L (ref 0–44)
AST: 19 U/L (ref 15–41)
Albumin: 3.8 g/dL (ref 3.5–5.0)
Alkaline Phosphatase: 76 U/L (ref 38–126)
Anion gap: 7 (ref 5–15)
BUN: 19 mg/dL (ref 8–23)
CO2: 27 mmol/L (ref 22–32)
Calcium: 9 mg/dL (ref 8.9–10.3)
Chloride: 106 mmol/L (ref 98–111)
Creatinine: 1.16 mg/dL (ref 0.61–1.24)
GFR, Est AFR Am: >60 mL/min (ref >60)
GFR, Estimated: 56 mL/min — ABNORMAL LOW (ref >60)
Glucose, Bld: 90 mg/dL (ref 70–99)
Potassium: 4.5 mmol/L (ref 3.5–5.1)
Sodium: 140 mmol/L (ref 135–145)
Total Bilirubin: 0.3 mg/dL (ref 0.3–1.2)
Total Protein: 6.3 g/dL — ABNORMAL LOW (ref 6.5–8.1)

## 2019-05-01 MED ORDER — LEUPROLIDE ACETATE (4 MONTH) 30 MG IM KIT
30.0000 mg | PACK | Freq: Once | INTRAMUSCULAR | Status: AC
  Administered 2019-05-01: 14:00:00 30 mg via INTRAMUSCULAR
  Filled 2019-05-01: qty 30

## 2019-05-01 NOTE — Patient Instructions (Signed)

## 2019-05-01 NOTE — Progress Notes (Signed)
[facility] - patient has not been to see them since 11/13/18. Caller couldn't determine if the patient cancelled that visit on 11/13/18 or not. The last time Lupron may have been administered was on or before 11/13/18  B. Corey Skains, PharmD, BCPS, BCOP

## 2019-05-02 LAB — PROSTATE-SPECIFIC AG, SERUM (LABCORP): Prostate Specific Ag, Serum: 16.2 ng/mL — ABNORMAL HIGH (ref 0.0–4.0)

## 2019-05-02 LAB — TESTOSTERONE: Testosterone: 6 ng/dL — ABNORMAL LOW (ref 264–916)

## 2019-05-03 ENCOUNTER — Other Ambulatory Visit: Payer: Self-pay

## 2019-05-03 ENCOUNTER — Inpatient Hospital Stay (HOSPITAL_BASED_OUTPATIENT_CLINIC_OR_DEPARTMENT_OTHER): Payer: Medicare Other | Admitting: Oncology

## 2019-05-03 ENCOUNTER — Telehealth: Payer: Self-pay | Admitting: Internal Medicine

## 2019-05-03 ENCOUNTER — Telehealth: Payer: Self-pay | Admitting: Oncology

## 2019-05-03 VITALS — BP 123/63 | HR 56 | Temp 98.5°F | Resp 17 | Ht 68.0 in | Wt 142.2 lb

## 2019-05-03 DIAGNOSIS — Z79818 Long term (current) use of other agents affecting estrogen receptors and estrogen levels: Secondary | ICD-10-CM | POA: Diagnosis not present

## 2019-05-03 DIAGNOSIS — Z192 Hormone resistant malignancy status: Secondary | ICD-10-CM

## 2019-05-03 DIAGNOSIS — Z8546 Personal history of malignant neoplasm of prostate: Secondary | ICD-10-CM

## 2019-05-03 DIAGNOSIS — Z7982 Long term (current) use of aspirin: Secondary | ICD-10-CM

## 2019-05-03 DIAGNOSIS — C61 Malignant neoplasm of prostate: Secondary | ICD-10-CM

## 2019-05-03 DIAGNOSIS — Z79899 Other long term (current) drug therapy: Secondary | ICD-10-CM

## 2019-05-03 NOTE — Progress Notes (Signed)
Hematology and Oncology Follow Up Visit  Randy [name] [id number] Sep 25, 1930 85 y.o. 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  He was initially diagnosed with a Gleason score 4+3 equal 7 and a PSA of 6.8 in 1991.   Prior Therapy:  At that time he underwent a radical prostatectomy for a Gleason score 4+3 = 7 and a PSA of 6.8.  He subsequently developed a biochemical relapse and required androgen deprivation therapy prior 2016.  He subsequently developed castration resistant disease with a rise in his PSA up to 6.63 in July 2018.  Apalutamide was started and he has been on it since that time.  His nadir of PSA was 3.06 in February 2019.  His PSA was up to 6.29 in August 2019 and in December 2019 was up to 7.87  Current therapy: Apalutamide 240 mg daily started July 2018.  Interim History: Randy [name] returns today for a follow-up visit.  Since her last visit he reports no major changes in his health.  He continues to tolerate apalutamide without any major complications.  He denies any excessive fatigue, tiredness.  He denies any bone pain or pathological fractures.  Denies any hot flashes or weight gain.  His appetite has been reasonable although he lost few pounds.  He does not report any headaches, blurry vision, syncope or seizures. Does not report any fevers, chills or sweats.  Does not report any cough, wheezing or hemoptysis.  Does not report any chest pain, palpitation, orthopnea or leg edema.  Does not report any nausea, vomiting or abdominal pain.  Does not report any constipation or diarrhea.  Does not report any skeletal complaints.    Does not report frequency, urgency or hematuria.  Does not report any skin rashes or lesions. Does not report any heat or cold intolerance.  Does not report any lymphadenopathy or petechiae.  Does not report any anxiety or depression.   Remaining review of systems is negative.    Medications: I have reviewed the patient's current medications.  Current Outpatient Medications  Medication Sig Dispense Refill  . ASPERCREME LIDOCAINE EX Apply 1 application topically daily as needed (pain).    Marland Kitchen aspirin 325 MG EC tablet Take 325 mg by mouth every morning.    . Calcium Carb-Cholecalciferol (CALCIUM 1000 + D PO) Take 1 tablet by mouth daily.    . ERLEADA 60 MG tablet Take 240 mg by mouth daily at 12 noon.     . fish oil-omega-3 fatty acids 1000 MG capsule Take 1 g by mouth 2 (two) times daily.     . fluticasone (FLONASE) 50 MCG/ACT nasal spray SPRAY 2 SPRAYS INTO EACH NOSTRIL EVERY DAY 16 g 2  . furosemide (LASIX) 40 MG tablet TAKE 1 TABLET (40 MG TOTAL) BY MOUTH DAILY AS NEEDED. 15 tablet 1  . linaclotide (LINZESS) 145 MCG CAPS capsule Take 1 capsule (145 mcg total) by mouth daily before breakfast. 90 capsule 1  . metoprolol tartrate (LOPRESSOR) 50 MG tablet TAKE 1 TABLET BY MOUTH TWICE A DAY 180 tablet 0  . niacin 500 MG tablet Take 500 mg by mouth at bedtime.    . nitroGLYCERIN (NITROSTAT) 0.4 MG SL tablet Place 1 tablet (0.4 mg total) under the tongue every 5 (five) minutes as needed for chest pain. 25 tablet 1  . sertraline (ZOLOFT) 50 MG tablet Take 1 tablet (50 mg total) by mouth every morning. [place]  tablet 1  . simvastatin (ZOCOR) 20 MG tablet Take 1 tablet (20 mg total) by mouth at bedtime. Need appointment before next refill 90 tablet 0  . traMADol (ULTRAM) 50 MG tablet     . zolpidem (AMBIEN) 10 MG tablet TAKE 1 TABLET BY MOUTH EVERY DAY AT BEDTIME SLEEP 30 tablet 1   No current facility-administered medications for this visit.      Allergies:  Allergies  Allergen Reactions  . Nsaids     Had a heart attack, so can not take NSAIDS    Past Medical History, Surgical history, Social history, and Family History were reviewed and updated.    Physical Exam:  Blood pressure 123/63, pulse (!) 56, temperature 98.5 F  (36.9 C), temperature source Oral, resp. rate 17, height 5\' 8"  (1.727 m), weight 142 lb 3.2 oz (64.5 kg), SpO2 100 %.   ECOG: 1   General appearance: alert and cooperative appeared without distress. Head: Normocephalic, without obvious abnormality Oropharynx: No oral thrush or ulcers. Eyes: No scleral icterus.  Pupils are equal and round reactive to light. Lymph nodes: Cervical, supraclavicular, and axillary nodes normal. Heart:regular rate and rhythm, S1, S2 normal, no murmur, click, rub or gallop Lung:chest clear, no wheezing, rales, normal symmetric air entry Abdomin: soft, non-tender, without masses or organomegaly. Neurological: No motor, sensory deficits.  Intact deep tendon reflexes. Skin: No rashes or lesions.  No ecchymosis or petechiae. Musculoskeletal: No joint deformity or effusion. Psychiatric: Mood and affect are appropriate.    Lab Results: Lab Results  Component Value Date   WBC 4.4 05/01/2019   HGB 12.7 (L) 05/01/2019   HCT 38.8 (L) 05/01/2019   MCV 93.7 05/01/2019   PLT 144 (L) 05/01/2019     Chemistry      Component Value Date/Time   NA 140 05/01/2019 1331   K 4.5 05/01/2019 1331   CL 106 05/01/2019 1331   CO2 27 05/01/2019 1331   BUN 19 05/01/2019 1331   CREATININE 1.16 05/01/2019 1331      Component Value Date/Time   CALCIUM 9.0 05/01/2019 1331   ALKPHOS 76 05/01/2019 1331   AST 19 05/01/2019 1331   ALT 11 05/01/2019 1331   BILITOT 0.3 05/01/2019 1331     Results for Randy, [name] (MRN [id number]) as of 05/03/2019 15:20  Ref. Range 06/20/2018 00:00 05/01/2019 13:31  PSA Unknown 6.29   Prostate Specific Ag, Serum Latest Ref Range: 0.0 - 4.0 ng/mL  16.2 (H)     Impression and Plan:  85 year old man with:  1.    Castration-resistant prostate cancer noted in 2018 after initially diagnosed in 1991 with a Gleason score of 4+3 equal 7 and a PSA of 6.8.    He continues to be in apalutamide without any major complications.  His PSA continues  to rise slowly although he has no symptoms or measurable disease at this time.  Risks and benefits of continuing this therapy versus switching to different agent was reviewed.  I see no need to switch to systemic chemotherapy at this time unless he has symptomatic progression.  2.  Androgen deprivation therapy: I recommended continuing Lupron every 4 months.  Next injection will be in October 2020.  Long-term complications including osteoporosis,  weight gain among others.   3.  Follow-up: In 4 months for repeat evaluation.   15  minutes was spent with the patient face-to-face today.  More than 50% of time was dedicated to reviewing disease status, laboratory data and answering questions  regarding future plan of care.     Zola Button, MD 6/25/20203:28 PM

## 2019-05-03 NOTE — Telephone Encounter (Signed)
Gave avs and calendar ° °

## 2019-05-04 NOTE — Telephone Encounter (Signed)
Please schedule within 1 month, needs visit for further refills.

## 2019-05-04 NOTE — Telephone Encounter (Signed)
[place] Controlled Database Checked Last filled: 04/04/19 # 30 LOV w/you: 03/24/18 Next appt w/you: None

## 2019-05-08 NOTE — Telephone Encounter (Signed)
Spoke with patient, he is going to call back and set up the appointment. He has other upcoming appointments and wants them to line up along with his wife's appointments.

## 2019-05-21 ENCOUNTER — Other Ambulatory Visit: Payer: Self-pay | Admitting: Internal Medicine

## 2019-06-03 ENCOUNTER — Telehealth: Payer: Self-pay | Admitting: Internal Medicine

## 2019-06-05 NOTE — Telephone Encounter (Signed)
Control database checked last refill: 05/04/2019 30 tabs LOV: 03/24/2018 TVN:[id number]

## 2019-06-05 NOTE — Telephone Encounter (Signed)
Patients wife calling to check on status of refill. Advised on turnaround time.

## 2019-06-07 ENCOUNTER — Encounter: Payer: Self-pay | Admitting: Internal Medicine

## 2019-06-07 ENCOUNTER — Other Ambulatory Visit: Payer: Self-pay

## 2019-06-07 ENCOUNTER — Ambulatory Visit (INDEPENDENT_AMBULATORY_CARE_PROVIDER_SITE_OTHER): Payer: Medicare Other | Admitting: Internal Medicine

## 2019-06-07 ENCOUNTER — Other Ambulatory Visit (INDEPENDENT_AMBULATORY_CARE_PROVIDER_SITE_OTHER): Payer: Medicare Other

## 2019-06-07 VITALS — BP 100/50 | HR 59 | Temp 97.4°F | Ht 68.0 in | Wt 140.0 lb

## 2019-06-07 DIAGNOSIS — F5101 Primary insomnia: Secondary | ICD-10-CM | POA: Diagnosis not present

## 2019-06-07 DIAGNOSIS — Z0000 Encounter for general adult medical examination without abnormal findings: Secondary | ICD-10-CM | POA: Diagnosis not present

## 2019-06-07 DIAGNOSIS — I10 Essential (primary) hypertension: Secondary | ICD-10-CM

## 2019-06-07 DIAGNOSIS — Z8546 Personal history of malignant neoplasm of prostate: Secondary | ICD-10-CM | POA: Diagnosis not present

## 2019-06-07 DIAGNOSIS — E782 Mixed hyperlipidemia: Secondary | ICD-10-CM

## 2019-06-07 LAB — CBC
HCT: 37.1 % — ABNORMAL LOW (ref 39.0–52.0)
Hemoglobin: 12.6 g/dL — ABNORMAL LOW (ref 13.0–17.0)
MCHC: 34 g/dL (ref 30.0–36.0)
MCV: 92 fl (ref 78.0–100.0)
Platelets: 156 10*3/uL (ref 150.0–400.0)
RBC: 4.03 Mil/uL — ABNORMAL LOW (ref 4.22–5.81)
RDW: 14.6 % (ref 11.5–15.5)
WBC: 5.1 10*3/uL (ref 4.0–10.5)

## 2019-06-07 LAB — COMPREHENSIVE METABOLIC PANEL
ALT: 11 U/L (ref 0–53)
AST: 21 U/L (ref 0–37)
Albumin: 4.1 g/dL (ref 3.5–5.2)
Alkaline Phosphatase: 65 U/L (ref 39–117)
BUN: 25 mg/dL — ABNORMAL HIGH (ref 6–23)
CO2: 25 mEq/L (ref 19–32)
Calcium: 9.1 mg/dL (ref 8.4–10.5)
Chloride: 106 mEq/L (ref 96–112)
Creatinine, Ser: 1.19 mg/dL (ref 0.40–1.50)
GFR: 57.79 mL/min — ABNORMAL LOW (ref >60.00)
Glucose, Bld: 110 mg/dL — ABNORMAL HIGH (ref 70–99)
Potassium: 4.2 mEq/L (ref 3.5–5.1)
Sodium: 140 mEq/L (ref 135–145)
Total Bilirubin: 0.4 mg/dL (ref 0.2–1.2)
Total Protein: 6.1 g/dL (ref 6.0–8.3)

## 2019-06-07 LAB — LIPID PANEL
Cholesterol: 156 mg/dL (ref 0–200)
HDL: 44.3 mg/dL (ref >39.00)
LDL Cholesterol: 76 mg/dL (ref 0–99)
NonHDL: 111.51
Total CHOL/HDL Ratio: 4
Triglycerides: 177 mg/dL — ABNORMAL HIGH (ref 0.0–149.0)
VLDL: 35.4 mg/dL (ref 0.0–40.0)

## 2019-06-07 MED ORDER — ZOLPIDEM TARTRATE 10 MG PO TABS: ORAL_TABLET | ORAL | 5 refills | Status: DC

## 2019-06-07 NOTE — Patient Instructions (Signed)

## 2019-06-07 NOTE — Progress Notes (Signed)
Subjective:   Patient ID: Randy [name], male    DOB: 1930/02/09, 85 y.o.   MRN: [id number]  HPI Here for medicare wellness and physical, no new complaints. Please see A/P for status and treatment of chronic medical problems.   Diet: heart healthy Physical activity: sedentary Depression/mood screen: negative Hearing: intact to whispered voice Visual acuity: grossly normal with lens, performs annual eye exam  ADLs: capable Fall risk: low Home safety: good Cognitive evaluation: intact to orientation, naming, recall and repetition EOL planning: adv directives discussed    Office Visit from 06/07/2019 in [place]  PHQ-2 Total Score  0      I have personally reviewed and have noted 1. The patient's medical and social history - reviewed today no changes 2. Their use of alcohol, tobacco or illicit drugs 3. Their current medications and supplements 4. The patient's functional ability including ADL's, fall risks, home safety risks and hearing or visual impairment. 5. Diet and physical activities 6. Evidence for depression or mood disorders 7. Care team reviewed and updated  Patient Care Team: [name], MD as PCP - General (Internal Medicine) Cira Rue, RN Nurse Navigator as Registered Nurse (Medical Oncology) Past Medical History:  Diagnosis Date  . Acute MI ([place])   . Arthritis   . Cancer [facility])    prostate  . Coronary artery disease   . GERD (gastroesophageal reflux disease)   . Hyperlipidemia   . Hypertension   . Wears glasses    Past Surgical History:  Procedure Laterality Date  . CORONARY ANGIOPLASTY     11/30/94 (Dr. Daneen Schick): Mild ant/anteroapical hypokinesis (known ant MI '94), EF 60%. 50-60%pLAD, 60% RI, 50% oRCA, 90% OM1 (s/p PTCA '96)  . KNEE ARTHROSCOPY Right 03/01/2018   Procedure: ARTHROSCOPY KNEE;  Surgeon: Frederik Pear, MD;  Location: [place];  Service: Orthopedics;  Laterality: Right;  debridement of medial and  lateral meniscal tears   History reviewed. No pertinent family history.   Review of Systems  Constitutional: Negative.   HENT: Negative.   Eyes: Negative.   Respiratory: Negative for cough, chest tightness and shortness of breath.   Cardiovascular: Negative for chest pain, palpitations and leg swelling.  Gastrointestinal: Negative for abdominal distention, abdominal pain, constipation, diarrhea, nausea and vomiting.  Musculoskeletal: Negative.   Skin: Negative.   Neurological: Negative.   Psychiatric/Behavioral: Negative.     Objective:  Physical Exam Constitutional:      Appearance: He is well-developed.  HENT:     Head: Normocephalic and atraumatic.  Neck:     Musculoskeletal: Normal range of motion.  Cardiovascular:     Rate and Rhythm: Normal rate and regular rhythm.  Pulmonary:     Effort: Pulmonary effort is normal. No respiratory distress.     Breath sounds: Normal breath sounds. No wheezing or rales.  Abdominal:     General: Bowel sounds are normal. There is no distension.     Palpations: Abdomen is soft.     Tenderness: There is no abdominal tenderness. There is no rebound.  Skin:    General: Skin is warm and dry.  Neurological:     Mental Status: He is alert and oriented to person, place, and time.     Coordination: Coordination normal.     Vitals:   06/07/19 1421  BP: (!) 100/50  Pulse: (!) 59  Temp: (!) 97.4 F (36.3 C)  TempSrc: Oral  SpO2: 98%  Weight: 140 lb (63.5 kg)  Height:  5\' 8"  (1.727 m)    Assessment & Plan:

## 2019-06-08 NOTE — Assessment & Plan Note (Signed)
BP at goal on metoprolol and denies lightheadedness. Checking CMP and adjust as needed. HR okay.

## 2019-06-08 NOTE — Assessment & Plan Note (Signed)
Refilled Randy [name] today, reviewed Randy [name] narcotic database within last 3 months and no inappropriate fills. Reminded about the risk of memory change, falls and he wishes to continue given significant increase in QOL.

## 2019-06-08 NOTE — Assessment & Plan Note (Signed)
Follows with urology for PSA

## 2019-06-08 NOTE — Assessment & Plan Note (Signed)
Checking lipid panel and adjust as needed.  

## 2019-06-08 NOTE — Assessment & Plan Note (Signed)
Flu shot yearly. Pneumonia complete. Shingrix counseled. Tetanus due 2029. Colonoscopy aged out. Counseled about sun safety and mole surveillance. Counseled about the dangers of distracted driving. Given 10 year screening recommendations.

## 2019-06-17 ENCOUNTER — Other Ambulatory Visit: Payer: Self-pay | Admitting: Internal Medicine

## 2019-06-30 ENCOUNTER — Other Ambulatory Visit: Payer: Self-pay | Admitting: Internal Medicine

## 2019-07-01 ENCOUNTER — Other Ambulatory Visit: Payer: Self-pay | Admitting: Internal Medicine

## 2019-07-18 ENCOUNTER — Other Ambulatory Visit: Payer: Self-pay | Admitting: Internal Medicine

## 2019-08-24 ENCOUNTER — Other Ambulatory Visit: Payer: Self-pay | Admitting: Internal Medicine

## 2019-09-04 ENCOUNTER — Other Ambulatory Visit: Payer: Self-pay

## 2019-09-04 ENCOUNTER — Inpatient Hospital Stay: Payer: Medicare Other | Attending: Oncology

## 2019-09-04 ENCOUNTER — Inpatient Hospital Stay: Payer: Medicare Other

## 2019-09-04 VITALS — BP 115/65 | HR 59 | Resp 18

## 2019-09-04 DIAGNOSIS — Z191 Hormone sensitive malignancy status: Secondary | ICD-10-CM | POA: Diagnosis not present

## 2019-09-04 DIAGNOSIS — R634 Abnormal weight loss: Secondary | ICD-10-CM | POA: Insufficient documentation

## 2019-09-04 DIAGNOSIS — Z79899 Other long term (current) drug therapy: Secondary | ICD-10-CM | POA: Insufficient documentation

## 2019-09-04 DIAGNOSIS — Z79818 Long term (current) use of other agents affecting estrogen receptors and estrogen levels: Secondary | ICD-10-CM | POA: Diagnosis not present

## 2019-09-04 DIAGNOSIS — R232 Flushing: Secondary | ICD-10-CM | POA: Diagnosis not present

## 2019-09-04 DIAGNOSIS — C61 Malignant neoplasm of prostate: Secondary | ICD-10-CM | POA: Diagnosis present

## 2019-09-04 DIAGNOSIS — R5383 Other fatigue: Secondary | ICD-10-CM | POA: Diagnosis not present

## 2019-09-04 DIAGNOSIS — Z8546 Personal history of malignant neoplasm of prostate: Secondary | ICD-10-CM

## 2019-09-04 LAB — CBC WITH DIFFERENTIAL (CANCER CENTER ONLY)
Abs Immature Granulocytes: 0.03 10*3/uL (ref 0.00–0.07)
Basophils Absolute: 0 10*3/uL (ref 0.0–0.1)
Basophils Relative: 1 %
Eosinophils Absolute: 0.1 10*3/uL (ref 0.0–0.5)
Eosinophils Relative: 1 %
HCT: 39.9 % (ref 39.0–52.0)
Hemoglobin: 13.5 g/dL (ref 13.0–17.0)
Immature Granulocytes: 1 %
Lymphocytes Relative: 22 %
Lymphs Abs: 1.2 10*3/uL (ref 0.7–4.0)
MCH: 30.8 pg (ref 26.0–34.0)
MCHC: 33.8 g/dL (ref 30.0–36.0)
MCV: 91.1 fL (ref 80.0–100.0)
Monocytes Absolute: 0.5 10*3/uL (ref 0.1–1.0)
Monocytes Relative: 8 %
Neutro Abs: 3.8 10*3/uL (ref 1.7–7.7)
Neutrophils Relative %: 67 %
Platelet Count: 160 10*3/uL (ref 150–400)
RBC: 4.38 MIL/uL (ref 4.22–5.81)
RDW: 12.8 % (ref 11.5–15.5)
WBC Count: 5.6 10*3/uL (ref 4.0–10.5)
nRBC: 0 % (ref 0.0–0.2)

## 2019-09-04 LAB — CMP (CANCER CENTER ONLY)
ALT: 9 U/L (ref 0–44)
AST: 17 U/L (ref 15–41)
Albumin: 4.1 g/dL (ref 3.5–5.0)
Alkaline Phosphatase: 73 U/L (ref 38–126)
Anion gap: 10 (ref 5–15)
BUN: 25 mg/dL — ABNORMAL HIGH (ref 8–23)
CO2: 25 mmol/L (ref 22–32)
Calcium: 9.8 mg/dL (ref 8.9–10.3)
Chloride: 104 mmol/L (ref 98–111)
Creatinine: 1.17 mg/dL (ref 0.61–1.24)
GFR, Est AFR Am: >60 mL/min (ref >60)
GFR, Estimated: 56 mL/min — ABNORMAL LOW (ref >60)
Glucose, Bld: 99 mg/dL (ref 70–99)
Potassium: 4.8 mmol/L (ref 3.5–5.1)
Sodium: 139 mmol/L (ref 135–145)
Total Bilirubin: 0.4 mg/dL (ref 0.3–1.2)
Total Protein: 6.8 g/dL (ref 6.5–8.1)

## 2019-09-04 MED ORDER — LEUPROLIDE ACETATE (4 MONTH) 30 MG ~~LOC~~ KIT
30.0000 mg | PACK | Freq: Once | SUBCUTANEOUS | Status: AC
  Administered 2019-09-04: 30 mg via SUBCUTANEOUS
  Filled 2019-09-04: qty 30

## 2019-09-04 NOTE — Patient Instructions (Signed)

## 2019-09-05 LAB — PROSTATE-SPECIFIC AG, SERUM (LABCORP): Prostate Specific Ag, Serum: 21 ng/mL — ABNORMAL HIGH (ref 0.0–4.0)

## 2019-09-06 ENCOUNTER — Inpatient Hospital Stay: Payer: Medicare Other | Admitting: Oncology

## 2019-09-06 ENCOUNTER — Other Ambulatory Visit: Payer: Self-pay

## 2019-09-06 VITALS — BP 109/65 | HR 79 | Temp 98.2°F | Resp 18 | Ht 68.0 in | Wt 138.4 lb

## 2019-09-06 DIAGNOSIS — Z79899 Other long term (current) drug therapy: Secondary | ICD-10-CM | POA: Diagnosis not present

## 2019-09-06 DIAGNOSIS — R5383 Other fatigue: Secondary | ICD-10-CM | POA: Diagnosis not present

## 2019-09-06 DIAGNOSIS — Z191 Hormone sensitive malignancy status: Secondary | ICD-10-CM | POA: Diagnosis not present

## 2019-09-06 DIAGNOSIS — Z8546 Personal history of malignant neoplasm of prostate: Secondary | ICD-10-CM

## 2019-09-06 DIAGNOSIS — R232 Flushing: Secondary | ICD-10-CM | POA: Diagnosis not present

## 2019-09-06 DIAGNOSIS — C61 Malignant neoplasm of prostate: Secondary | ICD-10-CM | POA: Diagnosis not present

## 2019-09-06 NOTE — Progress Notes (Signed)
Hematology and Oncology Follow Up Visit  Randy [name] [id number] October 27, 1930 85 y.o. 09/06/2019 12:01 PM [name], [name], Real Cons, *   Principle Diagnosis: 85 year old man with advanced prostate cancer with biochemical relapse documented in 2018.  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   Prior Therapy:  At that time he underwent a radical prostatectomy for a Gleason score 4+3 = 7 and a PSA of 6.8.  He subsequently developed a biochemical relapse and required androgen deprivation therapy prior 2016.  He subsequently developed castration resistant disease with a rise in his PSA up to 6.63 in July 2018.  Apalutamide was started and he has been on it since that time.  His nadir of PSA was 3.06 in February 2019.  His PSA was up to 6.29 in August 2019 and in December 2019 was up to 7.87  Current therapy: Apalutamide 240 mg daily started July 2018.  Interim History: Randy [name] is here for return evaluation.  Since the last visit, he reports no major changes in his health.  He continues to tolerate apalutamide without any complications.  He does report some mild fatigue and tiredness and of lost some weight.  He has not reports his appetite has been reasonable without any bone pain or pathological fractures.  He denied recent hospitalizations or illnesses.   Patient denied any alteration mental status, neuropathy, confusion or dizziness.  Denies any headaches or lethargy.  Denies any night sweats, weight loss or changes in appetite.  Denied orthopnea, dyspnea on exertion or chest discomfort.  Denies shortness of breath, difficulty breathing hemoptysis or cough.  Denies any abdominal distention, nausea, early satiety or dyspepsia.  Denies any hematuria, frequency, dysuria or nocturia.  Denies any skin irritation, dryness or rash.  Denies any ecchymosis or petechiae.  Denies any  lymphadenopathy or clotting.  Denies any heat or cold intolerance.  Denies any anxiety or depression.  Remaining review of system is negative.      Medications: Unchanged on review. Current Outpatient Medications  Medication Sig Dispense Refill  . ASPERCREME LIDOCAINE EX Apply 1 application topically daily as needed (pain).    Marland Kitchen aspirin 325 MG EC tablet Take 325 mg by mouth every morning.    . Calcium Carb-Cholecalciferol (CALCIUM 1000 + D PO) Take 1 tablet by mouth daily.    . ERLEADA 60 MG tablet Take 240 mg by mouth daily at 12 noon.     . fish oil-omega-3 fatty acids 1000 MG capsule Take 1 g by mouth 2 (two) times daily.     . fluticasone (FLONASE) 50 MCG/ACT nasal spray SPRAY 2 SPRAYS INTO EACH NOSTRIL EVERY DAY 16 mL 2  . furosemide (LASIX) 40 MG tablet TAKE 1 TABLET (40 MG TOTAL) BY MOUTH DAILY AS NEEDED. 15 tablet 1  . linaclotide (LINZESS) 145 MCG CAPS capsule Take 1 capsule (145 mcg total) by mouth daily before breakfast. 90 capsule 1  . metoprolol tartrate (LOPRESSOR) 50 MG tablet TAKE 1 TABLET BY MOUTH TWICE A DAY 180 tablet 2  . niacin 500 MG tablet Take 500 mg by mouth at bedtime.    . nitroGLYCERIN (NITROSTAT) 0.4 MG SL tablet Place 1 tablet (0.4 mg total) under the tongue every 5 (five) minutes as needed for chest pain. 25 tablet 1  . sertraline (ZOLOFT) 50 MG tablet TAKE 1 TABLET BY MOUTH EVERY MORNING 90 tablet 1  . simvastatin (ZOCOR)  20 MG tablet TAKE 1 TABLET (20 MG TOTAL) BY MOUTH AT BEDTIME. NEED APPOINTMENT BEFORE NEXT REFILL 90 tablet 1  . traMADol (ULTRAM) 50 MG tablet     . zolpidem (AMBIEN) 10 MG tablet TAKE 1 TABLET BY MOUTH EVERY DAY AT BEDTIME SLEEP 30 tablet 5   No current facility-administered medications for this visit.      Allergies:  Allergies  Allergen Reactions  . Nsaids     Had a heart attack, so can not take NSAIDS    Past Medical History, Surgical history, Social history, and Family History updated without changes.    Physical Exam:   Blood pressure 109/65, pulse 79, temperature 98.2 F (36.8 C), temperature source Oral, resp. rate 18, height 5\' 8"  (1.727 m), weight 138 lb 6.4 oz (62.8 kg), SpO2 93 %.    ECOG: 1    General appearance: Comfortable appearing without any discomfort Head: Normocephalic without any trauma Oropharynx: Mucous membranes are moist and pink without any thrush or ulcers. Eyes: Pupils are equal and round reactive to light. Lymph nodes: No cervical, supraclavicular, inguinal or axillary lymphadenopathy.   Heart:regular rate and rhythm.  S1 and S2 without leg edema. Lung: Clear without any rhonchi or wheezes.  No dullness to percussion. Abdomin: Soft, nontender, nondistended with good bowel sounds.  No hepatosplenomegaly. Musculoskeletal: No joint deformity or effusion.  Full range of motion noted. Neurological: No deficits noted on motor, sensory and deep tendon reflex exam. Skin: No petechial rash or dryness.  Appeared moist.      Lab Results: Lab Results  Component Value Date   WBC 5.6 09/04/2019   HGB 13.5 09/04/2019   HCT 39.9 09/04/2019   MCV 91.1 09/04/2019   PLT 160 09/04/2019     Chemistry      Component Value Date/Time   NA 139 09/04/2019 1517   K 4.8 09/04/2019 1517   CL 104 09/04/2019 1517   CO2 25 09/04/2019 1517   BUN 25 (H) 09/04/2019 1517   CREATININE 1.17 09/04/2019 1517      Component Value Date/Time   CALCIUM 9.8 09/04/2019 1517   ALKPHOS 73 09/04/2019 1517   AST 17 09/04/2019 1517   ALT 9 09/04/2019 1517   BILITOT 0.4 09/04/2019 1517      Results for Randy [name], Randy SR. (MRN [id number]) as of 09/06/2019 12:02  Ref. Range 05/01/2019 13:31 09/04/2019 15:17  Prostate Specific Ag, Serum Latest Ref Range: 0.0 - 4.0 ng/mL 16.2 (H) 21.0 (H)     Impression and Plan:  85 year old man with:  1.    Advanced prostate cancer with biochemical relapse diagnosed in 2018.  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    He has tolerated apalutamide  without any major complications.  The natural course of this disease was reviewed today as well as alternative treatment options were discussed.  These would include systemic chemotherapy, Xofigo and potentially switching to a different hormonal agent.  The plan is to restage him with a CT scan and a bone scan prior to next visit and will consider alternative options depending on his disease status.  2.  Androgen deprivation therapy: He will continue to receive the Eligard every 4 months.  Last injection given on 09/04/2019.  Long-term complication associated with this injection include weight gain, hot flashes among others.  3.  Follow-up: In 4 months for repeat evaluation.   15  minutes was spent with the patient face-to-face today.  More than 50% of time was dedicated to  reviewing disease status, laboratory data and answering questions regarding future plan of care.     Zola Button, MD 10/29/202012:01 PM

## 2019-09-07 ENCOUNTER — Telehealth: Payer: Self-pay | Admitting: Oncology

## 2019-09-07 NOTE — Telephone Encounter (Signed)
Scheduled apt per 10/29 los - mailed reminder letter with appt date and time

## 2019-10-24 ENCOUNTER — Telehealth: Payer: Self-pay | Admitting: Oncology

## 2019-10-24 NOTE — Telephone Encounter (Signed)
Returned patient's phone call regarding rescheduling February appointment time and rescheduled 03/02 appointments to 03/04.

## 2019-11-24 ENCOUNTER — Other Ambulatory Visit: Payer: Self-pay | Admitting: Internal Medicine

## 2019-12-02 ENCOUNTER — Other Ambulatory Visit: Payer: Self-pay | Admitting: Internal Medicine

## 2019-12-03 NOTE — Telephone Encounter (Signed)
Filled 11/03/2019 Zolpidem Tartrate 10 Mg Tablet 30#  Last ov 06/07/19 Next ov n/s

## 2019-12-31 ENCOUNTER — Other Ambulatory Visit: Payer: Self-pay

## 2019-12-31 MED ORDER — SERTRALINE HCL 50 MG PO TABS: 50.0000 mg | ORAL_TABLET | Freq: Every morning | ORAL | 1 refills | Status: DC

## 2020-01-01 ENCOUNTER — Other Ambulatory Visit: Payer: Medicare Other

## 2020-01-01 ENCOUNTER — Other Ambulatory Visit: Payer: Self-pay

## 2020-01-01 ENCOUNTER — Ambulatory Visit (HOSPITAL_COMMUNITY)
Admission: RE | Admit: 2020-01-01 | Discharge: 2020-01-01 | Disposition: A | Discharge status: Home / Self Care | Payer: Medicare Other | Source: Ambulatory Visit | Attending: Oncology | Admitting: Oncology

## 2020-01-01 ENCOUNTER — Inpatient Hospital Stay: Payer: Medicare Other | Attending: Oncology

## 2020-01-01 DIAGNOSIS — C61 Malignant neoplasm of prostate: Secondary | ICD-10-CM | POA: Diagnosis present

## 2020-01-01 DIAGNOSIS — Z8546 Personal history of malignant neoplasm of prostate: Secondary | ICD-10-CM | POA: Diagnosis present

## 2020-01-01 LAB — CBC WITH DIFFERENTIAL (CANCER CENTER ONLY)
Abs Immature Granulocytes: 0.02 10*3/uL (ref 0.00–0.07)
Basophils Absolute: 0 10*3/uL (ref 0.0–0.1)
Basophils Relative: 1 %
Eosinophils Absolute: 0.2 10*3/uL (ref 0.0–0.5)
Eosinophils Relative: 4 %
HCT: 40.7 % (ref 39.0–52.0)
Hemoglobin: 13.5 g/dL (ref 13.0–17.0)
Immature Granulocytes: 0 %
Lymphocytes Relative: 25 %
Lymphs Abs: 1.4 10*3/uL (ref 0.7–4.0)
MCH: 30.9 pg (ref 26.0–34.0)
MCHC: 33.2 g/dL (ref 30.0–36.0)
MCV: 93.1 fL (ref 80.0–100.0)
Monocytes Absolute: 0.5 10*3/uL (ref 0.1–1.0)
Monocytes Relative: 10 %
Neutro Abs: 3.4 10*3/uL (ref 1.7–7.7)
Neutrophils Relative %: 60 %
Platelet Count: 177 10*3/uL (ref 150–400)
RBC: 4.37 MIL/uL (ref 4.22–5.81)
RDW: 12.7 % (ref 11.5–15.5)
WBC Count: 5.7 10*3/uL (ref 4.0–10.5)
nRBC: 0 % (ref 0.0–0.2)

## 2020-01-01 LAB — CMP (CANCER CENTER ONLY)
ALT: 10 U/L (ref 0–44)
AST: 17 U/L (ref 15–41)
Albumin: 4 g/dL (ref 3.5–5.0)
Alkaline Phosphatase: 77 U/L (ref 38–126)
Anion gap: 8 (ref 5–15)
BUN: 22 mg/dL (ref 8–23)
CO2: 28 mmol/L (ref 22–32)
Calcium: 9.3 mg/dL (ref 8.9–10.3)
Chloride: 103 mmol/L (ref 98–111)
Creatinine: 1.06 mg/dL (ref 0.61–1.24)
GFR, Est AFR Am: >60 mL/min (ref >60)
GFR, Estimated: >60 mL/min (ref >60)
Glucose, Bld: 100 mg/dL — ABNORMAL HIGH (ref 70–99)
Potassium: 4.3 mmol/L (ref 3.5–5.1)
Sodium: 139 mmol/L (ref 135–145)
Total Bilirubin: 0.3 mg/dL (ref 0.3–1.2)
Total Protein: 6.7 g/dL (ref 6.5–8.1)

## 2020-01-01 MED ORDER — IOHEXOL 300 MG/ML  SOLN
100.0000 mL | Freq: Once | INTRAMUSCULAR | Status: AC | PRN
  Administered 2020-01-01: 100 mL via INTRAVENOUS

## 2020-01-01 MED ORDER — SODIUM CHLORIDE (PF) 0.9 % IJ SOLN
INTRAMUSCULAR | Status: AC
  Filled 2020-01-01: qty 50

## 2020-01-02 ENCOUNTER — Encounter (HOSPITAL_COMMUNITY)
Admission: RE | Admit: 2020-01-02 | Discharge: 2020-01-02 | Disposition: A | Discharge status: Home / Self Care | Payer: Medicare Other | Source: Ambulatory Visit | Attending: Oncology | Admitting: Oncology

## 2020-01-02 ENCOUNTER — Ambulatory Visit (HOSPITAL_COMMUNITY)
Admission: RE | Admit: 2020-01-02 | Discharge: 2020-01-02 | Disposition: A | Discharge status: Home / Self Care | Payer: Medicare Other | Source: Ambulatory Visit | Attending: Oncology | Admitting: Oncology

## 2020-01-02 DIAGNOSIS — Z8546 Personal history of malignant neoplasm of prostate: Secondary | ICD-10-CM

## 2020-01-02 LAB — PROSTATE-SPECIFIC AG, SERUM (LABCORP): Prostate Specific Ag, Serum: 23.3 ng/mL — ABNORMAL HIGH (ref 0.0–4.0)

## 2020-01-02 MED ORDER — TECHNETIUM TC 99M MEDRONATE IV KIT
20.3000 | PACK | Freq: Once | INTRAVENOUS | Status: AC | PRN
  Administered 2020-01-02: 20.3 via INTRAVENOUS

## 2020-01-08 ENCOUNTER — Ambulatory Visit: Payer: Medicare Other | Admitting: Oncology

## 2020-01-08 ENCOUNTER — Ambulatory Visit: Payer: Medicare Other

## 2020-01-10 ENCOUNTER — Other Ambulatory Visit: Payer: Self-pay

## 2020-01-10 ENCOUNTER — Ambulatory Visit: Payer: Medicare Other | Attending: Internal Medicine

## 2020-01-10 ENCOUNTER — Inpatient Hospital Stay: Payer: Medicare Other | Attending: Oncology | Admitting: Oncology

## 2020-01-10 ENCOUNTER — Inpatient Hospital Stay: Payer: Medicare Other

## 2020-01-10 VITALS — BP 118/62 | HR 66 | Temp 97.8°F | Resp 16 | Ht 68.0 in | Wt 144.9 lb

## 2020-01-10 DIAGNOSIS — Z191 Hormone sensitive malignancy status: Secondary | ICD-10-CM | POA: Diagnosis not present

## 2020-01-10 DIAGNOSIS — Z7982 Long term (current) use of aspirin: Secondary | ICD-10-CM | POA: Insufficient documentation

## 2020-01-10 DIAGNOSIS — Z79899 Other long term (current) drug therapy: Secondary | ICD-10-CM | POA: Diagnosis not present

## 2020-01-10 DIAGNOSIS — Z8546 Personal history of malignant neoplasm of prostate: Secondary | ICD-10-CM

## 2020-01-10 DIAGNOSIS — Z79818 Long term (current) use of other agents affecting estrogen receptors and estrogen levels: Secondary | ICD-10-CM | POA: Diagnosis not present

## 2020-01-10 DIAGNOSIS — E279 Disorder of adrenal gland, unspecified: Secondary | ICD-10-CM | POA: Diagnosis not present

## 2020-01-10 DIAGNOSIS — C61 Malignant neoplasm of prostate: Secondary | ICD-10-CM | POA: Diagnosis present

## 2020-01-10 DIAGNOSIS — Z23 Encounter for immunization: Secondary | ICD-10-CM | POA: Insufficient documentation

## 2020-01-10 DIAGNOSIS — R911 Solitary pulmonary nodule: Secondary | ICD-10-CM | POA: Insufficient documentation

## 2020-01-10 MED ORDER — LEUPROLIDE ACETATE (4 MONTH) 30 MG ~~LOC~~ KIT
30.0000 mg | PACK | Freq: Once | SUBCUTANEOUS | Status: AC
  Administered 2020-01-10: 30 mg via SUBCUTANEOUS
  Filled 2020-01-10: qty 30

## 2020-01-10 NOTE — Patient Instructions (Signed)

## 2020-01-10 NOTE — Progress Notes (Signed)
Hematology and Oncology Follow Up Visit  Randy [name] [id number] May 19, 1930 86 y.o. 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  He was initially diagnosed with Gleason score 4+3 equal 7 and a PSA of 6.8 in 1991.   Prior Therapy:  At that time he underwent a radical prostatectomy for a Gleason score 4+3 = 7 and a PSA of 6.8.  He subsequently developed a biochemical relapse and required androgen deprivation therapy prior 2016.  He subsequently developed castration resistant disease with a rise in his PSA up to 6.63 in July 2018.  Apalutamide was started and he has been on it since that time.  His nadir of PSA was 3.06 in February 2019.  His PSA was up to 6.29 in August 2019 and in December 2019 was up to 7.87  Current therapy: Apalutamide 240 mg daily started July 2018.  Interim History: Randy [name] returns today for a repeat evaluation.  Since the last visit, he reports no changes in his health.  He continues to enjoy reasonable quality of life without any recent hospitalization or illnesses.  Denies any abdominal pain or weight loss.  He denies any pelvic discomfort or urinary difficulties.  He is able to drive and attends to activities of daily living.      Medications: Reviewed without changes. Current Outpatient Medications  Medication Sig Dispense Refill  . zolpidem (AMBIEN) 10 MG tablet TAKE 1 TABLET BY MOUTH EVERY DAY AT BEDTIME SLEEP 30 tablet 5  . ASPERCREME LIDOCAINE EX Apply 1 application topically daily as needed (pain).    Marland Kitchen aspirin 325 MG EC tablet Take 325 mg by mouth every morning.    . Calcium Carb-Cholecalciferol (CALCIUM 1000 + D PO) Take 1 tablet by mouth daily.    . ERLEADA 60 MG tablet Take 240 mg by mouth daily at 12 noon.     . fish oil-omega-3 fatty acids 1000 MG capsule Take 1 g by mouth 2 (two) times daily.      . fluticasone (FLONASE) 50 MCG/ACT nasal spray SPRAY 2 SPRAYS INTO EACH NOSTRIL EVERY DAY 16 mL 2  . furosemide (LASIX) 40 MG tablet TAKE 1 TABLET (40 MG TOTAL) BY MOUTH DAILY AS NEEDED. 15 tablet 1  . linaclotide (LINZESS) 145 MCG CAPS capsule Take 1 capsule (145 mcg total) by mouth daily before breakfast. 90 capsule 1  . metoprolol tartrate (LOPRESSOR) 50 MG tablet TAKE 1 TABLET BY MOUTH TWICE A DAY 180 tablet 2  . niacin 500 MG tablet Take 500 mg by mouth at bedtime.    . nitroGLYCERIN (NITROSTAT) 0.4 MG SL tablet Place 1 tablet (0.4 mg total) under the tongue every 5 (five) minutes as needed for chest pain. 25 tablet 1  . sertraline (ZOLOFT) 50 MG tablet Take 1 tablet (50 mg total) by mouth every morning. 90 tablet 1  . simvastatin (ZOCOR) 20 MG tablet Take 1 tablet (20 mg total) by mouth at bedtime. 90 tablet 1  . traMADol (ULTRAM) 50 MG tablet      No current facility-administered medications for this visit.     Allergies:  Allergies  Allergen Reactions  . Nsaids     Had a heart attack, so can not take NSAIDS        Physical Exam:  Blood pressure 118/62, pulse 66, temperature 97.8 F (36.6 C), temperature source Temporal, resp. rate 16, height 5\' 8"  (  1.727 m), weight 144 lb 14.4 oz (65.7 kg), SpO2 100 %.     ECOG: 1   General appearance: Alert, awake without any distress. Head: Atraumatic without abnormalities Oropharynx: Without any thrush or ulcers. Eyes: No scleral icterus. Lymph nodes: No lymphadenopathy noted in the cervical, supraclavicular, or axillary nodes Heart:regular rate and rhythm, without any murmurs or gallops.   Lung: Clear to auscultation without any rhonchi, wheezes or dullness to percussion. Abdomin: Soft, nontender without any shifting dullness or ascites. Musculoskeletal: No clubbing or cyanosis. Neurological: No motor or sensory deficits. Skin: No rashes or lesions.       Lab Results: Lab Results  Component Value Date   WBC 5.7  01/01/2020   HGB 13.5 01/01/2020   HCT 40.7 01/01/2020   MCV 93.1 01/01/2020   PLT 177 01/01/2020     Chemistry      Component Value Date/Time   NA 139 01/01/2020 1515   K 4.3 01/01/2020 1515   CL 103 01/01/2020 1515   CO2 28 01/01/2020 1515   BUN 22 01/01/2020 1515   CREATININE 1.06 01/01/2020 1515      Component Value Date/Time   CALCIUM 9.3 01/01/2020 1515   ALKPHOS 77 01/01/2020 1515   AST 17 01/01/2020 1515   ALT 10 01/01/2020 1515   BILITOT 0.3 01/01/2020 1515      Results for Randy [name], Randy [name]. (MRN [id number]) as of 01/10/2020 14:46  Ref. Range 09/04/2019 15:17 01/01/2020 15:15  Prostate Specific Ag, Serum Latest Ref Range: 0.0 - 4.0 ng/mL 21.0 (H) 23.3 (H)   IMPRESSION: 1. Interval increase in volume of enhancing soft tissue nodule within the prostate bed which is worrisome for localized recurrence 2. Subcentimeter right posterior pelvic sidewall lymph node is mildly increased in size in the interval. 3. Stable right adrenal gland nodule. 4. Stable 4 mm right middle lobe lung nodule.    Impression and Plan:  86 year old man with:  1.    Castration-resistant prostate cancer with pelvic recurrence diagnosed in 2018.   The natural course of this disease was reviewed and his restaging work-up was updated in February 2021.  His PSA does show slight increase currently at 23 with interval increase in soft tissue nodule within the prostate bed that is worrisome for local recurrence.  Otherwise no widespread metastasis noted based on his CT scan bone scan which were reviewed and discussed with him today.  Treatment options were reviewed at this time which include continuing therapy with active surveillance in addition to apalutamide versus had pelvic radiation therapy.   After discussion today, we opted to continue with active surveillance at this time.  He is asymptomatic with very minimal progression at this time and he favors not to create any complications which  could be expected from radiation.  We will continue to monitor closely however.   2.  Androgen deprivation therapy: He received Eligard in October 2020 and therapy will be repeated today.  Long-term complications including weight gain, hot flashes among others were reviewed.   3.  Follow-up: He will return in 4 months for MD visit and injection.   30  minutes were dedicated to this visit. The time was spent on reviewing laboratory data, imaging studies, discussing treatment options, and answering questions regarding future plan.     Zola Button, MD 3/4/20212:44 PM

## 2020-01-10 NOTE — Progress Notes (Signed)
   [id number]  Name:  ALBINO MCCLARAN Sr.    MRN: [id number] DOB: May 26, 1930  01/10/2020  Mr. Southard was observed post Covid-19 immunization for 15 minutes without incident. He was provided with Vaccine Information Sheet and instruction to access the V-Safe system.   Mr. Gange was instructed to call 911 with any severe reactions post vaccine: Marland Kitchen Difficulty breathing  . Swelling of face and throat  . A fast heartbeat  . A bad rash all over body  . Dizziness and weakness   Immunizations Administered    Name Date Dose VIS Date Route   Pfizer COVID-19 Vaccine 01/10/2020  1:06 PM 0.3 mL 10/19/2019 Intramuscular   Manufacturer: [place]   Lot: [id number]   [place]: [id number]

## 2020-01-11 ENCOUNTER — Telehealth: Payer: Self-pay | Admitting: Oncology

## 2020-01-11 NOTE — Telephone Encounter (Signed)
Scheduled appt per 3/4 los.  Sent a message to HIM pool to get a calendar mailed out. 

## 2020-02-05 ENCOUNTER — Ambulatory Visit: Payer: Medicare Other | Attending: Internal Medicine

## 2020-02-05 DIAGNOSIS — Z23 Encounter for immunization: Secondary | ICD-10-CM

## 2020-02-05 NOTE — Progress Notes (Signed)
   [id number]  Name:  Randy [name] Sr.    MRN: [id number] DOB: [birth year]  02/05/2020  [name] was observed post Covid-19 immunization for 15 minutes without incident. He was provided with Vaccine Information Sheet and instruction to access the V-Safe system.   [name] was instructed to call 911 with any severe reactions post vaccine: Marland Kitchen Difficulty breathing  . Swelling of face and throat  . A fast heartbeat  . A bad rash all over body  . Dizziness and weakness   Immunizations Administered    Name Date Dose VIS Date Route   Pfizer COVID-19 Vaccine 02/05/2020  4:11 PM 0.3 mL 10/19/2019 Intramuscular   Manufacturer: [employer]   Lot: [id number]   [place]: [id number]

## 2020-02-20 ENCOUNTER — Other Ambulatory Visit: Payer: Self-pay | Admitting: Internal Medicine

## 2020-05-09 ENCOUNTER — Other Ambulatory Visit: Payer: Self-pay | Admitting: Oncology

## 2020-05-09 DIAGNOSIS — Z8546 Personal history of malignant neoplasm of prostate: Secondary | ICD-10-CM

## 2020-05-13 ENCOUNTER — Inpatient Hospital Stay: Payer: Medicare Other | Attending: Oncology

## 2020-05-13 ENCOUNTER — Other Ambulatory Visit: Payer: Self-pay

## 2020-05-13 DIAGNOSIS — Z191 Hormone sensitive malignancy status: Secondary | ICD-10-CM | POA: Diagnosis not present

## 2020-05-13 DIAGNOSIS — C61 Malignant neoplasm of prostate: Secondary | ICD-10-CM | POA: Insufficient documentation

## 2020-05-13 DIAGNOSIS — Z7982 Long term (current) use of aspirin: Secondary | ICD-10-CM | POA: Insufficient documentation

## 2020-05-13 DIAGNOSIS — Z79818 Long term (current) use of other agents affecting estrogen receptors and estrogen levels: Secondary | ICD-10-CM | POA: Diagnosis not present

## 2020-05-13 DIAGNOSIS — R911 Solitary pulmonary nodule: Secondary | ICD-10-CM | POA: Diagnosis not present

## 2020-05-13 DIAGNOSIS — Z8546 Personal history of malignant neoplasm of prostate: Secondary | ICD-10-CM

## 2020-05-13 DIAGNOSIS — Z79899 Other long term (current) drug therapy: Secondary | ICD-10-CM | POA: Diagnosis not present

## 2020-05-13 LAB — CMP (CANCER CENTER ONLY)
ALT: 10 U/L (ref 0–44)
AST: 17 U/L (ref 15–41)
Albumin: 3.8 g/dL (ref 3.5–5.0)
Alkaline Phosphatase: 73 U/L (ref 38–126)
Anion gap: 8 (ref 5–15)
BUN: 14 mg/dL (ref 8–23)
CO2: 28 mmol/L (ref 22–32)
Calcium: 9.4 mg/dL (ref 8.9–10.3)
Chloride: 106 mmol/L (ref 98–111)
Creatinine: 1.16 mg/dL (ref 0.61–1.24)
GFR, Est AFR Am: >60 mL/min (ref >60)
GFR, Estimated: 56 mL/min — ABNORMAL LOW (ref >60)
Glucose, Bld: 76 mg/dL (ref 70–99)
Potassium: 4.3 mmol/L (ref 3.5–5.1)
Sodium: 142 mmol/L (ref 135–145)
Total Bilirubin: 0.4 mg/dL (ref 0.3–1.2)
Total Protein: 6.4 g/dL — ABNORMAL LOW (ref 6.5–8.1)

## 2020-05-13 LAB — CBC WITH DIFFERENTIAL (CANCER CENTER ONLY)
Abs Immature Granulocytes: 0.03 10*3/uL (ref 0.00–0.07)
Basophils Absolute: 0 10*3/uL (ref 0.0–0.1)
Basophils Relative: 1 %
Eosinophils Absolute: 0.1 10*3/uL (ref 0.0–0.5)
Eosinophils Relative: 3 %
HCT: 39.3 % (ref 39.0–52.0)
Hemoglobin: 13.2 g/dL (ref 13.0–17.0)
Immature Granulocytes: 1 %
Lymphocytes Relative: 27 %
Lymphs Abs: 1.2 10*3/uL (ref 0.7–4.0)
MCH: 31.1 pg (ref 26.0–34.0)
MCHC: 33.6 g/dL (ref 30.0–36.0)
MCV: 92.5 fL (ref 80.0–100.0)
Monocytes Absolute: 0.4 10*3/uL (ref 0.1–1.0)
Monocytes Relative: 8 %
Neutro Abs: 2.6 10*3/uL (ref 1.7–7.7)
Neutrophils Relative %: 60 %
Platelet Count: 138 10*3/uL — ABNORMAL LOW (ref 150–400)
RBC: 4.25 MIL/uL (ref 4.22–5.81)
RDW: 13.2 % (ref 11.5–15.5)
WBC Count: 4.4 10*3/uL (ref 4.0–10.5)
nRBC: 0 % (ref 0.0–0.2)

## 2020-05-14 LAB — PROSTATE-SPECIFIC AG, SERUM (LABCORP): Prostate Specific Ag, Serum: 24 ng/mL — ABNORMAL HIGH (ref 0.0–4.0)

## 2020-05-20 ENCOUNTER — Inpatient Hospital Stay: Payer: Medicare Other | Admitting: Oncology

## 2020-05-20 ENCOUNTER — Other Ambulatory Visit: Payer: Self-pay | Admitting: Internal Medicine

## 2020-05-20 ENCOUNTER — Inpatient Hospital Stay: Payer: Medicare Other

## 2020-05-20 ENCOUNTER — Other Ambulatory Visit: Payer: Self-pay

## 2020-05-20 VITALS — BP 112/55 | HR 60 | Temp 97.6°F | Resp 17 | Ht 68.0 in | Wt 146.5 lb

## 2020-05-20 DIAGNOSIS — Z79899 Other long term (current) drug therapy: Secondary | ICD-10-CM | POA: Diagnosis not present

## 2020-05-20 DIAGNOSIS — C61 Malignant neoplasm of prostate: Secondary | ICD-10-CM | POA: Diagnosis not present

## 2020-05-20 DIAGNOSIS — Z7982 Long term (current) use of aspirin: Secondary | ICD-10-CM | POA: Diagnosis not present

## 2020-05-20 DIAGNOSIS — Z8546 Personal history of malignant neoplasm of prostate: Secondary | ICD-10-CM

## 2020-05-20 DIAGNOSIS — R911 Solitary pulmonary nodule: Secondary | ICD-10-CM | POA: Diagnosis not present

## 2020-05-20 DIAGNOSIS — Z191 Hormone sensitive malignancy status: Secondary | ICD-10-CM | POA: Diagnosis not present

## 2020-05-20 MED ORDER — LEUPROLIDE ACETATE (4 MONTH) 30 MG ~~LOC~~ KIT
PACK | SUBCUTANEOUS | Status: AC
  Filled 2020-05-20: qty 30

## 2020-05-20 MED ORDER — LEUPROLIDE ACETATE (4 MONTH) 30 MG ~~LOC~~ KIT
30.0000 mg | PACK | Freq: Once | SUBCUTANEOUS | Status: AC
  Administered 2020-05-20: 30 mg via SUBCUTANEOUS

## 2020-05-20 NOTE — Patient Instructions (Signed)

## 2020-05-20 NOTE — Progress Notes (Signed)
Hematology and Oncology Follow Up Visit  Randy HAIN Sr. [id number] 13-Dec-1929 86 y.o. 05/20/2020 1:31 PM Randy [name] Randy [name], [name], Randy [name], *   Principle Diagnosis: 86 year old man with locally advanced prostate cancer with pelvic involvement diagnosed in 2018.  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   Prior Therapy:  At that time he underwent a radical prostatectomy for a Gleason score 4+3 = 7 and a PSA of 6.8.  He subsequently developed a biochemical relapse and required androgen deprivation therapy prior 2016.  He subsequently developed castration resistant disease with a rise in his PSA up to 6.63 in July 2018.  Apalutamide was started and he has been on it since that time.  His nadir of PSA was 3.06 in February 2019.  His PSA was up to 6.29 in August 2019 and in December 2019 was up to 7.87  Current therapy: Apalutamide 240 mg daily started July 2018.  Interim History: Randy [name] is here for a follow-up visit.  Since the last visit, he reports no major changes in his health.  He continues to tolerate apalutamide without any complaints or concerns.  Denies any excessive fatigue, tiredness or bone pain.  Formal status and quality of life remains unchanged.      Medications: Unchanged on review. Current Outpatient Medications  Medication Sig Dispense Refill  . zolpidem (AMBIEN) 10 MG tablet TAKE 1 TABLET BY MOUTH EVERY DAY AT BEDTIME SLEEP 30 tablet 5  . ASPERCREME LIDOCAINE EX Apply 1 application topically daily as needed (pain).    Marland Kitchen aspirin 325 MG EC tablet Take 325 mg by mouth every morning.    . Calcium Carb-Cholecalciferol (CALCIUM 1000 + D PO) Take 1 tablet by mouth daily.    . ERLEADA 60 MG tablet Take 240 mg by mouth daily at 12 noon.     . fish oil-omega-3 fatty acids 1000 MG capsule Take 1 g by mouth 2 (two) times daily.     . fluticasone (FLONASE) 50 MCG/ACT nasal spray SPRAY 2 SPRAYS INTO EACH  NOSTRIL EVERY DAY 16 mL 2  . furosemide (LASIX) 40 MG tablet TAKE 1 TABLET (40 MG TOTAL) BY MOUTH DAILY AS NEEDED. 15 tablet 1  . LINZESS 145 MCG CAPS capsule TAKE 1 CAPSULE (145 MCG TOTAL) BY MOUTH DAILY BEFORE BREAKFAST. 30 capsule 2  . metoprolol tartrate (LOPRESSOR) 50 MG tablet TAKE 1 TABLET BY MOUTH TWICE A DAY 180 tablet 0  . niacin 500 MG tablet Take 500 mg by mouth at bedtime.    . nitroGLYCERIN (NITROSTAT) 0.4 MG SL tablet Place 1 tablet (0.4 mg total) under the tongue every 5 (five) minutes as needed for chest pain. 25 tablet 1  . sertraline (ZOLOFT) 50 MG tablet Take 1 tablet (50 mg total) by mouth every morning. 90 tablet 1  . simvastatin (ZOCOR) 20 MG tablet Take 1 tablet (20 mg total) by mouth at bedtime. 90 tablet 1  . traMADol (ULTRAM) 50 MG tablet      No current facility-administered medications for this visit.     Allergies:  Allergies  Allergen Reactions  . Nsaids     Had a heart attack, so can not take NSAIDS        Physical Exam:   Blood pressure (!) 112/55, pulse 60, temperature 97.6 F (36.4 C), temperature source Temporal, resp. rate 17, height 5\' 8"  (1.727 m), weight 146 lb 8 oz (66.5 kg), SpO2 97 %.  Can be injury   ECOG: 1    General appearance: Comfortable appearing without any discomfort Head: Normocephalic without any trauma Oropharynx: Mucous membranes are moist and pink without any thrush or ulcers. Eyes: Pupils are equal and round reactive to light. Lymph nodes: No cervical, supraclavicular, inguinal or axillary lymphadenopathy.   Heart:regular rate and rhythm.  S1 and S2 without leg edema. Lung: Clear without any rhonchi or wheezes.  No dullness to percussion. Abdomin: Soft, nontender, nondistended with good bowel sounds.  No hepatosplenomegaly. Musculoskeletal: No joint deformity or effusion.  Full range of motion noted. Neurological: No deficits noted on motor, sensory and deep tendon reflex exam. Skin: No petechial rash or dryness.   Appeared moist.         Lab Results: Lab Results  Component Value Date   WBC 4.4 05/13/2020   HGB 13.2 05/13/2020   HCT 39.3 05/13/2020   MCV 92.5 05/13/2020   PLT 138 (L) 05/13/2020     Chemistry      Component Value Date/Time   NA 142 05/13/2020 1214   K 4.3 05/13/2020 1214   CL 106 05/13/2020 1214   CO2 28 05/13/2020 1214   BUN 14 05/13/2020 1214   CREATININE 1.16 05/13/2020 1214      Component Value Date/Time   CALCIUM 9.4 05/13/2020 1214   ALKPHOS 73 05/13/2020 1214   AST 17 05/13/2020 1214   ALT 10 05/13/2020 1214   BILITOT 0.4 05/13/2020 1214     Results for Randy [name], Randy SR. (MRN [id number]) as of 05/20/2020 12:58  Ref. Range 01/01/2020 15:15 05/13/2020 12:14  Prostate Specific Ag, Serum Latest Ref Range: 0.0 - 4.0 ng/mL 23.3 (H) 24.0 (H)     IMPRESSION: 1. Interval increase in volume of enhancing soft tissue nodule within the prostate bed which is worrisome for localized recurrence 2. Subcentimeter right posterior pelvic sidewall lymph node is mildly increased in size in the interval. 3. Stable right adrenal gland nodule. 4. Stable 4 mm right middle lobe lung nodule.    Impression and Plan:  86 year old man with:  1.    Advanced prostate cancer with pelvic soft tissue involvement that is currently castration-resistant since 2018.  He continues to be on apalutamide which she has tolerated without any major complications.  His PSA continues to be overall stable without any evidence of clinical signs of progression.  The natural course of this disease and treatment options were reviewed for the future.  Systemic chemotherapy would be an option if he develops widespread metastatic disease.  For the time being I recommended continuing the same treatment without changes.  We will repeat imaging studies if his PSA starts to rise.   2.  Androgen deprivation therapy: He is currently on Eligard which he will will receive for today and repeated in 4  months.  3.  Prognosis and goals of care: The goal of therapy remains palliative but his performance status is adequate and aggressive measures are warranted.  4.  Follow-up: He will return in 4 months for a follow-up and repeat injection.   30  minutes were spent on this encounter.  The time was dedicated to reviewing laboratory data, discussing disease status update and future treatment options.     Zola Button, MD 7/13/20211:31 PM

## 2020-05-21 ENCOUNTER — Telehealth: Payer: Self-pay | Admitting: Oncology

## 2020-05-21 NOTE — Telephone Encounter (Signed)
Scheduled per 07/13 los, called and spoke with patient's wife. Patient will be notified of upcoming appointment.

## 2020-05-27 ENCOUNTER — Other Ambulatory Visit: Payer: Self-pay | Admitting: Internal Medicine

## 2020-05-28 ENCOUNTER — Other Ambulatory Visit: Payer: Self-pay | Admitting: Internal Medicine

## 2020-05-28 NOTE — Telephone Encounter (Signed)
04/28/2020 Zolpidem Tartrate 10 Mg Tablet 30#  Last ov 06/07/19 Next ov n/s

## 2020-05-30 NOTE — Telephone Encounter (Signed)
Please call him to get yearly OV with Dr. Sharlet Salina scheduled; this will be the last refill of Ambien.

## 2020-06-18 ENCOUNTER — Other Ambulatory Visit: Payer: Self-pay | Admitting: Internal Medicine

## 2020-06-30 ENCOUNTER — Other Ambulatory Visit: Payer: Self-pay | Admitting: Family

## 2020-07-29 ENCOUNTER — Other Ambulatory Visit: Payer: Self-pay | Admitting: Family

## 2020-08-19 ENCOUNTER — Other Ambulatory Visit: Payer: Self-pay | Admitting: Internal Medicine

## 2020-08-28 ENCOUNTER — Other Ambulatory Visit: Payer: Self-pay | Admitting: Family

## 2020-09-12 ENCOUNTER — Telehealth: Payer: Self-pay

## 2020-09-12 NOTE — Telephone Encounter (Signed)
Called patient with appointment times.

## 2020-09-16 ENCOUNTER — Other Ambulatory Visit: Payer: Medicare Other

## 2020-09-16 ENCOUNTER — Inpatient Hospital Stay: Payer: Medicare Other | Attending: Oncology

## 2020-09-16 ENCOUNTER — Ambulatory Visit: Payer: Medicare Other | Admitting: Oncology

## 2020-09-16 ENCOUNTER — Inpatient Hospital Stay: Payer: Medicare Other

## 2020-09-16 ENCOUNTER — Other Ambulatory Visit: Payer: Self-pay

## 2020-09-16 ENCOUNTER — Ambulatory Visit: Payer: Medicare Other

## 2020-09-16 ENCOUNTER — Inpatient Hospital Stay: Payer: Medicare Other | Admitting: Oncology

## 2020-09-16 VITALS — BP 129/66 | HR 61 | Temp 97.3°F | Resp 18 | Wt 152.0 lb

## 2020-09-16 DIAGNOSIS — Z8546 Personal history of malignant neoplasm of prostate: Secondary | ICD-10-CM

## 2020-09-16 DIAGNOSIS — Z79899 Other long term (current) drug therapy: Secondary | ICD-10-CM | POA: Diagnosis not present

## 2020-09-16 DIAGNOSIS — R9721 Rising PSA following treatment for malignant neoplasm of prostate: Secondary | ICD-10-CM | POA: Diagnosis not present

## 2020-09-16 DIAGNOSIS — Z79818 Long term (current) use of other agents affecting estrogen receptors and estrogen levels: Secondary | ICD-10-CM | POA: Insufficient documentation

## 2020-09-16 DIAGNOSIS — Z7982 Long term (current) use of aspirin: Secondary | ICD-10-CM | POA: Insufficient documentation

## 2020-09-16 DIAGNOSIS — C61 Malignant neoplasm of prostate: Secondary | ICD-10-CM | POA: Diagnosis not present

## 2020-09-16 LAB — CMP (CANCER CENTER ONLY)
ALT: 8 U/L (ref 0–44)
AST: 17 U/L (ref 15–41)
Albumin: 4 g/dL (ref 3.5–5.0)
Alkaline Phosphatase: 61 U/L (ref 38–126)
Anion gap: 6 (ref 5–15)
BUN: 21 mg/dL (ref 8–23)
CO2: 25 mmol/L (ref 22–32)
Calcium: 9 mg/dL (ref 8.9–10.3)
Chloride: 109 mmol/L (ref 98–111)
Creatinine: 1.07 mg/dL (ref 0.61–1.24)
GFR, Estimated: >60 mL/min (ref >60)
Glucose, Bld: 86 mg/dL (ref 70–99)
Potassium: 4.1 mmol/L (ref 3.5–5.1)
Sodium: 140 mmol/L (ref 135–145)
Total Bilirubin: 0.6 mg/dL (ref 0.3–1.2)
Total Protein: 6.5 g/dL (ref 6.5–8.1)

## 2020-09-16 LAB — CBC WITH DIFFERENTIAL (CANCER CENTER ONLY)
Abs Immature Granulocytes: 0.05 10*3/uL (ref 0.00–0.07)
Basophils Absolute: 0 10*3/uL (ref 0.0–0.1)
Basophils Relative: 1 %
Eosinophils Absolute: 0.2 10*3/uL (ref 0.0–0.5)
Eosinophils Relative: 3 %
HCT: 38.5 % — ABNORMAL LOW (ref 39.0–52.0)
Hemoglobin: 13 g/dL (ref 13.0–17.0)
Immature Granulocytes: 1 %
Lymphocytes Relative: 19 %
Lymphs Abs: 1.2 10*3/uL (ref 0.7–4.0)
MCH: 31 pg (ref 26.0–34.0)
MCHC: 33.8 g/dL (ref 30.0–36.0)
MCV: 91.7 fL (ref 80.0–100.0)
Monocytes Absolute: 0.6 10*3/uL (ref 0.1–1.0)
Monocytes Relative: 9 %
Neutro Abs: 4.5 10*3/uL (ref 1.7–7.7)
Neutrophils Relative %: 67 %
Platelet Count: 160 10*3/uL (ref 150–400)
RBC: 4.2 MIL/uL — ABNORMAL LOW (ref 4.22–5.81)
RDW: 13.5 % (ref 11.5–15.5)
WBC Count: 6.5 10*3/uL (ref 4.0–10.5)
nRBC: 0 % (ref 0.0–0.2)

## 2020-09-16 MED ORDER — LEUPROLIDE ACETATE (4 MONTH) 30 MG ~~LOC~~ KIT
PACK | SUBCUTANEOUS | Status: AC
  Filled 2020-09-16: qty 30

## 2020-09-16 MED ORDER — LEUPROLIDE ACETATE (4 MONTH) 30 MG ~~LOC~~ KIT
30.0000 mg | PACK | Freq: Once | SUBCUTANEOUS | Status: AC
  Administered 2020-09-16: 30 mg via SUBCUTANEOUS

## 2020-09-16 NOTE — Progress Notes (Signed)
Hematology and Oncology Follow Up Visit  Randy [name] [id number] November 19, 1929 86 y.o. 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  He was initially diagnosed in 1991 with Gleason score 4+3 equal 7 and a PSA of 6.8.  His disease is limited to pelvic soft tissue involvement.   Prior Therapy:  At that time he underwent a radical prostatectomy for a Gleason score 4+3 = 7 and a PSA of 6.8.  He subsequently developed a biochemical relapse and required androgen deprivation therapy prior 2016.  He subsequently developed castration resistant disease with a rise in his PSA up to 6.63 in July 2018.  Apalutamide was started and he has been on it since that time.  His nadir of PSA was 3.06 in February 2019.  His PSA was up to 6.29 in August 2019 and in December 2019 was up to 7.87  Current therapy: Apalutamide 240 mg daily started July 2018.  Interim History: Randy [name] returns today for repeat evaluation.  Since the last visit, he reports no major changes in his health.  He continues to tolerate apalutamide without any complaints.  He denies any nausea, vomiting or abdominal pain.  He denies any bone pain or pathological fractures.  He had continues to attempt activities of daily living without any decline in ability to do so.  As performance status quality of life remain excellent.      Medications: Reviewed without changes. Current Outpatient Medications  Medication Sig Dispense Refill  . ASPERCREME LIDOCAINE EX Apply 1 application topically daily as needed (pain).    Marland Kitchen aspirin 325 MG EC tablet Take 325 mg by mouth every morning.    . Calcium Carb-Cholecalciferol (CALCIUM 1000 + D PO) Take 1 tablet by mouth daily.    . ERLEADA 60 MG tablet Take 240 mg by mouth daily at 12 noon.     . fish oil-omega-3 fatty acids 1000 MG capsule Take 1 g by mouth 2 (two) times daily.      . fluticasone (FLONASE) 50 MCG/ACT nasal spray SPRAY 2 SPRAYS INTO EACH NOSTRIL EVERY DAY 16 mL 2  . furosemide (LASIX) 40 MG tablet TAKE 1 TABLET (40 MG TOTAL) BY MOUTH DAILY AS NEEDED. 15 tablet 1  . LINZESS 145 MCG CAPS capsule TAKE 1 CAPSULE (145 MCG TOTAL) BY MOUTH DAILY BEFORE BREAKFAST. 30 capsule 2  . metoprolol tartrate (LOPRESSOR) 50 MG tablet TAKE 1 TABLET BY MOUTH TWICE A DAY 180 tablet 0  . niacin 500 MG tablet Take 500 mg by mouth at bedtime.    . nitroGLYCERIN (NITROSTAT) 0.4 MG SL tablet Place 1 tablet (0.4 mg total) under the tongue every 5 (five) minutes as needed for chest pain. 25 tablet 1  . sertraline (ZOLOFT) 50 MG tablet TAKE 1 TABLET BY MOUTH EVERY MORNING 90 tablet 1  . simvastatin (ZOCOR) 20 MG tablet TAKE 1 TABLET BY MOUTH EVERYDAY AT BEDTIME 90 tablet 0  . traMADol (ULTRAM) 50 MG tablet     . zolpidem (AMBIEN) 10 MG tablet TAKE 1 TABLET BY MOUTH EVERY DAY AT BEDTIME SLEEP 30 tablet 0   No current facility-administered medications for this visit.     Allergies:  Allergies  Allergen Reactions  . Nsaids     Had a heart attack, so can not take NSAIDS        Physical Exam:   Blood pressure 129/66, pulse 61, temperature (!) 97.3  F (36.3 C), temperature source Tympanic, resp. rate 18, weight 152 lb (68.9 kg), SpO2 99 %.    ECOG: 1    General appearance: Alert, awake without any distress. Head: Atraumatic without abnormalities Oropharynx: Without any thrush or ulcers. Eyes: No scleral icterus. Lymph nodes: No lymphadenopathy noted in the cervical, supraclavicular, or axillary nodes Heart:regular rate and rhythm, without any murmurs or gallops.   Lung: Clear to auscultation without any rhonchi, wheezes or dullness to percussion. Abdomin: Soft, nontender without any shifting dullness or ascites. Musculoskeletal: No clubbing or cyanosis. Neurological: No motor or sensory deficits. Skin: No rashes or lesions.         Lab Results: Lab Results   Component Value Date   WBC 4.4 05/13/2020   HGB 13.2 05/13/2020   HCT 39.3 05/13/2020   MCV 92.5 05/13/2020   PLT 138 (L) 05/13/2020     Chemistry      Component Value Date/Time   NA 142 05/13/2020 1214   K 4.3 05/13/2020 1214   CL 106 05/13/2020 1214   CO2 28 05/13/2020 1214   BUN 14 05/13/2020 1214   CREATININE 1.16 05/13/2020 1214      Component Value Date/Time   CALCIUM 9.4 05/13/2020 1214   ALKPHOS 73 05/13/2020 1214   AST 17 05/13/2020 1214   ALT 10 05/13/2020 1214   BILITOT 0.4 05/13/2020 1214      Results for Randy [name], Randy SR. (MRN [id number]) as of 09/16/2020 14:18  Ref. Range 01/01/2020 15:15 05/13/2020 12:14  Prostate Specific Ag, Serum Latest Ref Range: 0.0 - 4.0 ng/mL 23.3 (H) 24.0 (H)    Impression and Plan:  86 year old man with:  1.    Castration-resistant prostate cancer with limited pelvic soft tissue disease noted in 2018.   The natural course of his disease was reviewed including laboratory data as well as imaging studies.  He is currently on apalutamide which she has tolerated very well.  Treatment options moving forward were discussed including continuing apalutamide versus treatment with different salvage options.  Systemic chemotherapy versus local radiation pending repeat imaging studies.  We will update his imaging studies in February 2022 and make appropriate changes based on.  2.  Androgen deprivation therapy: I recommended continuing Eligard indefinitely.  He has received it in July 2021 be repeated today.   3.  Prognosis and goals of care: Therapy is palliative although aggressive measures are warranted at this time.  4.  Follow-up: In 4 months for repeat follow-up.   30  minutes were dedicated to this visit.  The time was spent on reviewing his disease status, reviewing laboratory data and treatment options for the future.     Zola Button, MD 11/9/20212:15 PM

## 2020-09-17 LAB — PROSTATE-SPECIFIC AG, SERUM (LABCORP): Prostate Specific Ag, Serum: 28.5 ng/mL — ABNORMAL HIGH (ref 0.0–4.0)

## 2020-09-20 ENCOUNTER — Ambulatory Visit: Payer: Self-pay

## 2020-09-20 ENCOUNTER — Ambulatory Visit: Payer: Medicare Other | Attending: Internal Medicine

## 2020-09-20 DIAGNOSIS — Z23 Encounter for immunization: Secondary | ICD-10-CM

## 2020-09-20 NOTE — Progress Notes (Signed)
   [facility]  Name:  Randy TARANGO Sr.    MRN: [id number] DOB: [birth year]  09/20/2020  [name] was observed post Covid-19 immunization for 15 minutes without incident. He was provided with Vaccine Information Sheet and instruction to access the V-Safe system.   [name] was instructed to call 911 with any severe reactions post vaccine: Marland Kitchen Difficulty breathing  . Swelling of face and throat  . A fast heartbeat  . A bad rash all over body  . Dizziness and weakness   Immunizations Administered    Name Date Dose VIS Date Route   Pfizer COVID-19 Vaccine 09/20/2020  2:38 PM 0.3 mL 08/27/2020 Intramuscular   Manufacturer: [place]   Lot: [id number]   [place]: [id number]

## 2020-10-01 ENCOUNTER — Other Ambulatory Visit: Payer: Self-pay | Admitting: Internal Medicine

## 2020-10-04 ENCOUNTER — Other Ambulatory Visit: Payer: Self-pay | Admitting: Internal Medicine

## 2020-10-20 ENCOUNTER — Telehealth: Payer: Self-pay

## 2020-10-20 NOTE — Telephone Encounter (Signed)
[employer] called in regards to a [employer] patient assistance program for the [employer]. Patient would need a printed Rx to be faxed to [facility] for the Patient assistance.  [employer] # [phone] Fax # [phone]

## 2020-10-21 ENCOUNTER — Other Ambulatory Visit: Payer: Self-pay | Admitting: *Deleted

## 2020-10-21 MED ORDER — ERLEADA 60 MG PO TABS: 240.0000 mg | ORAL_TABLET | Freq: Every day | ORAL | 2 refills | Status: DC

## 2020-10-21 NOTE — Telephone Encounter (Signed)
Please send RX

## 2020-11-23 ENCOUNTER — Other Ambulatory Visit: Payer: Self-pay | Admitting: Internal Medicine

## 2020-11-24 NOTE — Telephone Encounter (Signed)
I don't see where the patent has had a office visit or pending appointment. Ok to refill? Please advise

## 2020-11-25 ENCOUNTER — Other Ambulatory Visit: Payer: Self-pay | Admitting: Internal Medicine

## 2020-11-25 NOTE — Telephone Encounter (Signed)
Sent in 30 days, can we reach out to him to get him in within that time frame? Thanks

## 2020-12-10 ENCOUNTER — Inpatient Hospital Stay: Payer: Medicare Other

## 2020-12-15 ENCOUNTER — Inpatient Hospital Stay: Payer: Medicare Other | Attending: Oncology

## 2020-12-15 ENCOUNTER — Encounter (HOSPITAL_COMMUNITY)
Admission: RE | Admit: 2020-12-15 | Discharge: 2020-12-15 | Disposition: A | Discharge status: Home / Self Care | Payer: Medicare Other | Source: Ambulatory Visit | Attending: Oncology | Admitting: Oncology

## 2020-12-15 ENCOUNTER — Ambulatory Visit (HOSPITAL_COMMUNITY)
Admission: RE | Admit: 2020-12-15 | Discharge: 2020-12-15 | Disposition: A | Discharge status: Home / Self Care | Payer: Medicare Other | Source: Ambulatory Visit | Attending: Oncology | Admitting: Oncology

## 2020-12-15 ENCOUNTER — Other Ambulatory Visit: Payer: Self-pay

## 2020-12-15 DIAGNOSIS — Z7982 Long term (current) use of aspirin: Secondary | ICD-10-CM | POA: Insufficient documentation

## 2020-12-15 DIAGNOSIS — Z8546 Personal history of malignant neoplasm of prostate: Secondary | ICD-10-CM

## 2020-12-15 DIAGNOSIS — Z79899 Other long term (current) drug therapy: Secondary | ICD-10-CM | POA: Diagnosis not present

## 2020-12-15 DIAGNOSIS — K5730 Diverticulosis of large intestine without perforation or abscess without bleeding: Secondary | ICD-10-CM | POA: Diagnosis not present

## 2020-12-15 DIAGNOSIS — D3501 Benign neoplasm of right adrenal gland: Secondary | ICD-10-CM | POA: Diagnosis not present

## 2020-12-15 DIAGNOSIS — D171 Benign lipomatous neoplasm of skin and subcutaneous tissue of trunk: Secondary | ICD-10-CM | POA: Diagnosis not present

## 2020-12-15 DIAGNOSIS — C61 Malignant neoplasm of prostate: Secondary | ICD-10-CM | POA: Insufficient documentation

## 2020-12-15 DIAGNOSIS — D3500 Benign neoplasm of unspecified adrenal gland: Secondary | ICD-10-CM | POA: Diagnosis not present

## 2020-12-15 LAB — CBC WITH DIFFERENTIAL (CANCER CENTER ONLY)
Abs Immature Granulocytes: 0.03 10*3/uL (ref 0.00–0.07)
Basophils Absolute: 0 10*3/uL (ref 0.0–0.1)
Basophils Relative: 1 %
Eosinophils Absolute: 0.3 10*3/uL (ref 0.0–0.5)
Eosinophils Relative: 5 %
HCT: 42.1 % (ref 39.0–52.0)
Hemoglobin: 14 g/dL (ref 13.0–17.0)
Immature Granulocytes: 1 %
Lymphocytes Relative: 22 %
Lymphs Abs: 1.2 10*3/uL (ref 0.7–4.0)
MCH: 30.7 pg (ref 26.0–34.0)
MCHC: 33.3 g/dL (ref 30.0–36.0)
MCV: 92.3 fL (ref 80.0–100.0)
Monocytes Absolute: 0.5 10*3/uL (ref 0.1–1.0)
Monocytes Relative: 9 %
Neutro Abs: 3.4 10*3/uL (ref 1.7–7.7)
Neutrophils Relative %: 62 %
Platelet Count: 169 10*3/uL (ref 150–400)
RBC: 4.56 MIL/uL (ref 4.22–5.81)
RDW: 13 % (ref 11.5–15.5)
WBC Count: 5.4 10*3/uL (ref 4.0–10.5)
nRBC: 0 % (ref 0.0–0.2)

## 2020-12-15 LAB — CMP (CANCER CENTER ONLY)
ALT: 10 U/L (ref 0–44)
AST: 17 U/L (ref 15–41)
Albumin: 4.2 g/dL (ref 3.5–5.0)
Alkaline Phosphatase: 76 U/L (ref 38–126)
Anion gap: 9 (ref 5–15)
BUN: 16 mg/dL (ref 8–23)
CO2: 25 mmol/L (ref 22–32)
Calcium: 9.5 mg/dL (ref 8.9–10.3)
Chloride: 107 mmol/L (ref 98–111)
Creatinine: 1.04 mg/dL (ref 0.61–1.24)
GFR, Estimated: >60 mL/min (ref >60)
Glucose, Bld: 102 mg/dL — ABNORMAL HIGH (ref 70–99)
Potassium: 4.7 mmol/L (ref 3.5–5.1)
Sodium: 141 mmol/L (ref 135–145)
Total Bilirubin: 0.3 mg/dL (ref 0.3–1.2)
Total Protein: 6.9 g/dL (ref 6.5–8.1)

## 2020-12-15 MED ORDER — IOHEXOL 9 MG/ML PO SOLN
ORAL | Status: AC
  Administered 2020-12-15: 500 mL via ORAL
  Filled 2020-12-15: qty 1000

## 2020-12-15 MED ORDER — IOHEXOL 9 MG/ML PO SOLN: 1000.0000 mL | ORAL | Status: AC

## 2020-12-15 MED ORDER — TECHNETIUM TC 99M MEDRONATE IV KIT
19.3000 | PACK | Freq: Once | INTRAVENOUS | Status: AC | PRN
  Administered 2020-12-15: 19.3 via INTRAVENOUS

## 2020-12-15 MED ORDER — IOHEXOL 300 MG/ML  SOLN
100.0000 mL | Freq: Once | INTRAMUSCULAR | Status: AC | PRN
  Administered 2020-12-15: 100 mL via INTRAVENOUS

## 2020-12-16 LAB — PROSTATE-SPECIFIC AG, SERUM (LABCORP): Prostate Specific Ag, Serum: 29.7 ng/mL — ABNORMAL HIGH (ref 0.0–4.0)

## 2020-12-17 ENCOUNTER — Telehealth: Payer: Self-pay | Admitting: Internal Medicine

## 2020-12-17 ENCOUNTER — Other Ambulatory Visit: Payer: Self-pay

## 2020-12-17 ENCOUNTER — Inpatient Hospital Stay: Payer: Medicare Other | Admitting: Oncology

## 2020-12-17 VITALS — BP 161/58 | HR 64 | Temp 97.0°F | Resp 18 | Ht 68.0 in | Wt 155.9 lb

## 2020-12-17 DIAGNOSIS — Z7982 Long term (current) use of aspirin: Secondary | ICD-10-CM | POA: Diagnosis not present

## 2020-12-17 DIAGNOSIS — D3501 Benign neoplasm of right adrenal gland: Secondary | ICD-10-CM | POA: Diagnosis not present

## 2020-12-17 DIAGNOSIS — Z79899 Other long term (current) drug therapy: Secondary | ICD-10-CM | POA: Diagnosis not present

## 2020-12-17 DIAGNOSIS — Z8546 Personal history of malignant neoplasm of prostate: Secondary | ICD-10-CM

## 2020-12-17 DIAGNOSIS — C61 Malignant neoplasm of prostate: Secondary | ICD-10-CM | POA: Diagnosis not present

## 2020-12-17 NOTE — Telephone Encounter (Addendum)
  Spouse to have patient schedule CPE and AWV when he comes into office for flu shot on 2/10

## 2020-12-17 NOTE — Progress Notes (Signed)
Hematology and Oncology Follow Up Visit  Randy [name] [id number] 18-Sep-1930 87 y.o. 12/17/2020 10:09 AM Randy [name] Randy [name], [name], Randy [name], *   Principle Diagnosis: 87 year old man with locally advanced prostate cancer initially diagnosed in 20. He has castration-resistant with a pelvic recurrence after initially found to have Gleason score 4+3 = 7 and a PSA of 6.8.     Prior Therapy:  At that time he underwent a radical prostatectomy for a Gleason score 4+3 = 7 and a PSA of 6.8.  He subsequently developed a biochemical relapse and required androgen deprivation therapy prior 2016.  He subsequently developed castration resistant disease with a rise in his PSA up to 6.63 in July 2018.  Apalutamide was started and he has been on it since that time.  His nadir of PSA was 3.06 in February 2019.  His PSA was up to 6.29 in August 2019 and in December 2019 was up to 7.87  Current therapy: Apalutamide 240 mg daily started July 2018.  Interim History: Randy [name] is here for a follow-up visit. Since the last visit, he reports no major changes in his health.  He continues to tolerate apalutamide issues.  He denies any nausea, fatigue or abdominal discomfort.  He denies any recent hospitalizations or illnesses.  His performance status and quality of life remained excellent.  He denies any hematochezia, melena or urinary difficulties.      Medications: Updated on review. Current Outpatient Medications  Medication Sig Dispense Refill  . ASPERCREME LIDOCAINE EX Apply 1 application topically daily as needed (pain).    Marland Kitchen aspirin 325 MG EC tablet Take 325 mg by mouth every morning.    . Calcium Carb-Cholecalciferol (CALCIUM 1000 + D PO) Take 1 tablet by mouth daily.    . ERLEADA 60 MG tablet Take 4 tablets (240 mg total) by mouth daily at 12 noon. 120 tablet 2  . fish oil-omega-3 fatty acids 1000 MG capsule Take 1 g by mouth 2 (two) times daily.     . fluticasone (FLONASE) 50 MCG/ACT  nasal spray SPRAY 2 SPRAYS INTO EACH NOSTRIL EVERY DAY 16 mL 2  . furosemide (LASIX) 40 MG tablet TAKE 1 TABLET (40 MG TOTAL) BY MOUTH DAILY AS NEEDED. 15 tablet 1  . LINZESS 145 MCG CAPS capsule TAKE 1 CAPSULE (145 MCG TOTAL) BY MOUTH DAILY BEFORE BREAKFAST. 30 capsule 2  . metoprolol tartrate (LOPRESSOR) 50 MG tablet TAKE 1 TABLET BY MOUTH TWICE A DAY 60 tablet 0  . niacin 500 MG tablet Take 500 mg by mouth at bedtime.    . nitroGLYCERIN (NITROSTAT) 0.4 MG SL tablet Place 1 tablet (0.4 mg total) under the tongue every 5 (five) minutes as needed for chest pain. 25 tablet 1  . sertraline (ZOLOFT) 50 MG tablet TAKE 1 TABLET BY MOUTH EVERY MORNING 90 tablet 0  . simvastatin (ZOCOR) 20 MG tablet TAKE 1 TABLET BY MOUTH EVERYDAY AT BEDTIME 90 tablet 0  . traMADol (ULTRAM) 50 MG tablet     . zolpidem (AMBIEN) 10 MG tablet TAKE 1 TABLET BY MOUTH EVERY DAY AT BEDTIME SLEEP 30 tablet 3   No current facility-administered medications for this visit.     Allergies:  Allergies  Allergen Reactions  . Nsaids     Had a heart attack, so can not take NSAIDS        Physical Exam:   Blood pressure (!) 161/58, pulse 64, temperature (!) 97 F (36.1 C), temperature source Tympanic, resp. rate 18,  height 5\' 8"  (1.727 m), weight 155 lb 14.4 oz (70.7 kg).     ECOG: 1    General appearance: Comfortable appearing without any discomfort Head: Normocephalic without any trauma Oropharynx: Mucous membranes are moist and pink without any thrush or ulcers. Eyes: Pupils are equal and round reactive to light. Lymph nodes: No cervical, supraclavicular, inguinal or axillary lymphadenopathy.   Heart:regular rate and rhythm.  S1 and S2 without leg edema. Lung: Clear without any rhonchi or wheezes.  No dullness to percussion. Abdomin: Soft, nontender, nondistended with good bowel sounds.  No hepatosplenomegaly. Musculoskeletal: No joint deformity or effusion.  Full range of motion noted. Neurological: No  deficits noted on motor, sensory and deep tendon reflex exam. Skin: No petechial rash or dryness.  Appeared moist.           Lab Results: Lab Results  Component Value Date   WBC 5.4 12/15/2020   HGB 14.0 12/15/2020   HCT 42.1 12/15/2020   MCV 92.3 12/15/2020   PLT 169 12/15/2020     Chemistry      Component Value Date/Time   NA 141 12/15/2020 1041   K 4.7 12/15/2020 1041   CL 107 12/15/2020 1041   CO2 25 12/15/2020 1041   BUN 16 12/15/2020 1041   CREATININE 1.04 12/15/2020 1041      Component Value Date/Time   CALCIUM 9.5 12/15/2020 1041   ALKPHOS 76 12/15/2020 1041   AST 17 12/15/2020 1041   ALT 10 12/15/2020 1041   BILITOT 0.3 12/15/2020 1041      Results for Randy [name], Randy SR. (MRN [id number]) as of 12/17/2020 10:10  Ref. Range 12/15/2020 10:41  Prostate Specific Ag, Serum Latest Ref Range: 0.0 - 4.0 ng/mL 29.7 (H)  IMPRESSION: Increased size of 3.3 cm enhancing soft tissue mass in prostatectomy bed which involves the posterior wall of the urinary bladder, consistent with local recurrence of carcinoma.  No evidence of lymphadenopathy or distant metastatic disease.  Colonic diverticulosis. No radiographic evidence of diverticulitis.  Stable benign right adrenal adenoma.    Impression and Plan:  87 year old man with:  1.   Advanced prostate cancer with local recurrence noted in 2018. 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  His disease status was updated at this time and treatment options were reviewed. Laboratory data and imaging studies obtained on December 15, 2020 were discussed. His PSA is continue to show slow rise and also increase in the size of a soft tissue mass in the prostatectomy bed. No evidence of widespread metastatic disease at this time. He continues to be on apalutamide without any major complications.  Management choices at this time might include local therapy with radiation given the localized nature of his recurrence  versus switching his systemic therapy. I prefer treating a local approach at this time before switching his systemic therapy. I will make the appropriate referral to radiation oncology to evaluate her. He is a marginal candidate for systemic chemotherapy and would like to defer that option unless it necessary.  2.  Androgen deprivation therapy: I recommended continuing Eligard indefinitely. Complications occluding weight gain, hot flashes among others were reviewed. He will receive after March 9 and repeat in 4 months.   3.  Prognosis and goals of care: Aggressive measures remains warranted at this time given his reasonable performance status. He has incurable malignancy however.  4.  Follow-up: We will be in the next few months to follow his progress.   30  minutes were spent on  this encounter. The time was dedicated to reviewing studies, laboratory data and treatment options.     Zola Button, MD 2/9/202210:09 AM

## 2020-12-18 ENCOUNTER — Ambulatory Visit: Payer: Medicare Other

## 2020-12-23 ENCOUNTER — Other Ambulatory Visit: Payer: Self-pay | Admitting: Internal Medicine

## 2020-12-26 ENCOUNTER — Other Ambulatory Visit: Payer: Self-pay | Admitting: *Deleted

## 2020-12-30 ENCOUNTER — Other Ambulatory Visit: Payer: Self-pay

## 2020-12-30 DIAGNOSIS — Z8546 Personal history of malignant neoplasm of prostate: Secondary | ICD-10-CM

## 2020-12-30 MED ORDER — ERLEADA 60 MG PO TABS: 240.0000 mg | ORAL_TABLET | Freq: Every day | ORAL | 2 refills | Status: DC

## 2020-12-31 ENCOUNTER — Other Ambulatory Visit: Payer: Self-pay | Admitting: Internal Medicine

## 2021-01-12 ENCOUNTER — Telehealth: Payer: Self-pay | Admitting: Oncology

## 2021-01-12 NOTE — Telephone Encounter (Signed)
Rescheduled appointment per 03/07 schedule message. Contacted patients wife, patient is aware.

## 2021-01-13 ENCOUNTER — Telehealth: Payer: Self-pay

## 2021-01-13 NOTE — Telephone Encounter (Signed)
Received call from pt's. wife to clarify appt. informed her pt. has his injection appt. on 3/9 at 2:30 pm and to request for his schedule to be printed out during the appt. wife verbalized understanding.

## 2021-01-14 ENCOUNTER — Other Ambulatory Visit: Payer: Self-pay

## 2021-01-14 ENCOUNTER — Inpatient Hospital Stay: Payer: Medicare Other | Attending: Oncology

## 2021-01-14 ENCOUNTER — Inpatient Hospital Stay: Payer: Medicare Other

## 2021-01-14 VITALS — BP 152/78 | HR 62 | Temp 98.2°F | Resp 18

## 2021-01-14 DIAGNOSIS — Z79818 Long term (current) use of other agents affecting estrogen receptors and estrogen levels: Secondary | ICD-10-CM | POA: Insufficient documentation

## 2021-01-14 DIAGNOSIS — Z8546 Personal history of malignant neoplasm of prostate: Secondary | ICD-10-CM

## 2021-01-14 DIAGNOSIS — C61 Malignant neoplasm of prostate: Secondary | ICD-10-CM | POA: Diagnosis present

## 2021-01-14 MED ORDER — LEUPROLIDE ACETATE (4 MONTH) 30 MG ~~LOC~~ KIT
30.0000 mg | PACK | Freq: Once | SUBCUTANEOUS | Status: AC
  Administered 2021-01-14: 30 mg via SUBCUTANEOUS

## 2021-01-14 NOTE — Patient Instructions (Signed)

## 2021-01-15 NOTE — Progress Notes (Signed)
GU Location of Tumor / Histology: locally advanced prostate cancer initially diagnosed in 1991  If Prostate Cancer, Gleason Score is (4 + 3) and PSA is (6.8)  Randy Boston Sr. subsequently developed a biochemical relapse and required androgen deprivation therapy prior 2016. He subsequently developed castration resistant disease with a rise in his PSA up to 6.63 in July 2018. Apalutamide was started and he has been on it since that time.   Past/Anticipated interventions by urology, if any: He underwent a radical prostatectomy.  CT abd/pelvis revealed an Increased size of 3.3 cm enhancing soft tissue mass in prostatectomy bed which involves the posterior wall of the urinary bladder,consistent with local recurrence of carcinoma.  Past/Anticipated interventions by medical oncology, if any: He was started on Apalutamide in July 2018 and has been on it ever since. Per Dr. Hazeline Junker note he recommends Eligard indefinitely starting after March 9th then every four months. Aggressive measures remains warranted at this time given his reasonable performance status. He has incurable malignancy however.  Weight changes, if any: denies  Bowel/Bladder complaints, if any: IPSS 4. SHIM 1. Denies dysuria or hematuria. Reports occasional urinary leakage associated urinary urgency. Denies any bowel complaints.   Nausea/Vomiting, if any: denies  Pain issues, if any:  denies  SAFETY ISSUES:  Prior radiation? denies  Pacemaker/ICD? denies  Possible current pregnancy? no, male patient  Is the patient on methotrexate? denies  Current Complaints / other details:  87 year old male.

## 2021-01-16 ENCOUNTER — Ambulatory Visit
Admission: RE | Admit: 2021-01-16 | Discharge: 2021-01-16 | Disposition: A | Discharge status: Home / Self Care | Payer: Medicare Other | Source: Ambulatory Visit | Attending: Radiation Oncology | Admitting: Radiation Oncology

## 2021-01-16 ENCOUNTER — Other Ambulatory Visit: Payer: Self-pay

## 2021-01-16 ENCOUNTER — Encounter: Payer: Self-pay | Admitting: Radiation Oncology

## 2021-01-16 DIAGNOSIS — C61 Malignant neoplasm of prostate: Secondary | ICD-10-CM | POA: Insufficient documentation

## 2021-01-16 DIAGNOSIS — Z191 Hormone sensitive malignancy status: Secondary | ICD-10-CM | POA: Insufficient documentation

## 2021-01-16 HISTORY — DX: Malignant neoplasm of prostate: C61

## 2021-01-16 NOTE — Progress Notes (Addendum)
Radiation Oncology         [phone] ________________________________  Initial Outpatient Consultation - Conducted via Telephone due to current COVID-19 concerns for limiting patient exposure  Name: CORA STETSON Sr. MRN: [id number]  Date: 01/16/2021  DOB: 03-03-1930  JQ:[id number], Real Cons, MD  Wyatt Portela, MD   REFERRING PHYSICIAN: Wyatt Portela, MD  DIAGNOSIS: 87 y.o. 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    ICD-10-CM   1. 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 is a 87 y.o. male with a diagnosis of prostate cancer. He was initially diagnosed with Gleason 4+3 prostate cancer in 1991 and underwent radical prostatectomy at that time, under the care and direction of Dr. Joelyn Oms.  Final surgical pathology confirmed Gleason 4+3 adenocarcinoma of the prostate with extraprostatic extension and lymph node involvement.  He did not receive any adjuvant treatment at that time.  He later developed a biochemical recurrence in 2016 and was treated with ADT under the care of Dr. Janice Norrie. Despite the ADT, his PSA continued to rise up to 6.63 in 05/2017. He underwent restaging CT A/P at that time showing a hyperattenuating soft tissue mass in the surgical bed. He was started on Apalutamide at that time. His PSA reached a nadir of 3.06 in 12/2017 but again began to rise thereafter, reaching 7.87 by 10/2018 despite continuation of the Lupron and Zytiga. Restaging CT A/P in 11/2018 confirmed and interval increase in size of the irregular enhancing nodule within the prostatectomy bed. He was referred to Dr. Alen Blew in 02/2019, who recommended continuing ADT and Apalutamide. Repeat PSA in 04/2019 showed a further jump to 16.2 and has been rising since that time.  His PSA  increased to 23.3 in February 2021 and on surveillance CT A/P in 12/2019, was again noted to have interval increase in the volume of enhancing soft tissue nodule within the prostate bed, as well as mild increase in size of a subcentimeter right posterior pelvic sidewall lymph node. Dr. Alen Blew discussed considering salvage radiation therapy at that time, but given the patient was asymptomatic, they opted to continue the current regimen with Eligard ADT every 4 months and Zytiga daily.  His PSA has continued to rise up to 28.5 in November 2021 and more recently up to 29.7 on 12/15/2020.  He had restaging CT A/P on 12/15/2020 showing a further increase in size of the enhancing soft tissue mass in the prostatectomy bed, now measuring 3.3 cm, which involves the posterior wall of the urinary bladder.  There was no evidence of lymphadenopathy or distant metastatic disease.  A bone scan from the same day was also negative for osseous metastatic disease.   The patient reviewed the PSA and recent imaging results with his medical oncologist and he has kindly been referred today for discussion of potential salvage radiation treatment options.   PREVIOUS RADIATION THERAPY: No  PAST MEDICAL HISTORY:  Past Medical History:  Diagnosis Date  . Acute MI ([place])   . Arthritis   . Coronary artery disease   . GERD (gastroesophageal reflux disease)   . Hyperlipidemia   . Hypertension   . Prostate cancer ([place])   . Wears glasses       PAST SURGICAL HISTORY: Past Surgical History:  Procedure Laterality Date  .  CORONARY ANGIOPLASTY     11/30/94 (Dr. Daneen Schick): Mild ant/anteroapical hypokinesis (known ant MI '94), EF 60%. 50-60%pLAD, 60% RI, 50% oRCA, 90% OM1 (s/p PTCA '96)  . KNEE ARTHROSCOPY Right 03/01/2018   Procedure: ARTHROSCOPY KNEE;  Surgeon: Frederik Pear, MD;  Location: [facility];  Service: Orthopedics;  Laterality: Right;  debridement of medial and lateral meniscal tears    FAMILY HISTORY:  Family History   Problem Relation Age of Onset  . Bone cancer Father   . Breast cancer Neg Hx   . Colon cancer Neg Hx   . Prostate cancer Neg Hx   . Pancreatic cancer Neg Hx     SOCIAL HISTORY:  Social History   Socioeconomic History  . Marital status: Married    Spouse name: Jeanett Schlein  . Number of children: 2  . Years of education: Not on file  . Highest education level: Not on file  Occupational History    Comment: retired  Tobacco Use  . Smoking status: Never Smoker  . Smokeless tobacco: Never Used  Vaping Use  . Vaping Use: Never used  Substance and Sexual Activity  . Alcohol use: No  . Drug use: No  . Sexual activity: Not Currently  Other Topics Concern  . Not on file  Social History Narrative  . Not on file   Social Determinants of Health   Financial Resource Strain: Not on file  Food Insecurity: Not on file  Transportation Needs: Not on file  Physical Activity: Not on file  Stress: Not on file  Social Connections: Not on file  Intimate Partner Violence: Not on file    ALLERGIES: Nsaids  MEDICATIONS:  Current Outpatient Medications  Medication Sig Dispense Refill  . ASPERCREME LIDOCAINE EX Apply 1 application topically daily as needed (pain).    Marland Kitchen aspirin 325 MG EC tablet Take 325 mg by mouth every morning.    . Calcium Carb-Cholecalciferol (CALCIUM 1000 + D PO) Take 1 tablet by mouth daily.    . ERLEADA 60 MG tablet Take 4 tablets (240 mg total) by mouth daily at 12 noon. 120 tablet 2  . fish oil-omega-3 fatty acids 1000 MG capsule Take 1 g by mouth 2 (two) times daily.     . fluticasone (FLONASE) 50 MCG/ACT nasal spray SPRAY 2 SPRAYS INTO EACH NOSTRIL EVERY DAY 16 mL 2  . metoprolol tartrate (LOPRESSOR) 50 MG tablet TAKE 1 TABLET BY MOUTH TWICE A DAY 60 tablet 0  . niacin 500 MG tablet Take 500 mg by mouth at bedtime.    . sertraline (ZOLOFT) 50 MG tablet TAKE 1 TABLET BY MOUTH EVERY MORNING 90 tablet 0  . simvastatin (ZOCOR) 20 MG tablet TAKE 1 TABLET BY MOUTH  EVERYDAY AT BEDTIME 60 tablet 0  . zolpidem (AMBIEN) 10 MG tablet TAKE 1 TABLET BY MOUTH EVERY DAY AT BEDTIME SLEEP 30 tablet 3  . LINZESS 145 MCG CAPS capsule TAKE 1 CAPSULE (145 MCG TOTAL) BY MOUTH DAILY BEFORE BREAKFAST. (Patient not taking: Reported on 01/16/2021) 30 capsule 2  . nitroGLYCERIN (NITROSTAT) 0.4 MG SL tablet Place 1 tablet (0.4 mg total) under the tongue every 5 (five) minutes as needed for chest pain. (Patient not taking: Reported on 01/16/2021) 25 tablet 1   No current facility-administered medications for this encounter.    REVIEW OF SYSTEMS:  On review of systems, the patient reports that he is doing well overall. He denies any chest pain, shortness of breath, cough, fevers, chills, night sweats, unintended weight changes.  He denies any bowel disturbances, and denies abdominal pain, nausea or vomiting. He denies any new musculoskeletal or joint aches or pains. His IPSS was 4, indicating mild urinary symptoms. He reports occasional urinary leakage associated with urinary urgency. His SHIM was 1, indicating he has severe/postoperative erectile dysfunction. A complete review of systems is obtained and is otherwise negative.    PHYSICAL EXAM:  Wt Readings from Last 3 Encounters:  01/16/21 155 lb (70.3 kg)  12/17/20 155 lb 14.4 oz (70.7 kg)  09/16/20 152 lb (68.9 kg)   Temp Readings from Last 3 Encounters:  01/14/21 98.2 F (36.8 C) (Oral)  12/17/20 (!) 97 F (36.1 C) (Tympanic)  09/16/20 (!) 97.3 F (36.3 C) (Tympanic)   BP Readings from Last 3 Encounters:  01/14/21 (!) 152/78  12/17/20 (!) 161/58  09/16/20 129/66   Pulse Readings from Last 3 Encounters:  01/14/21 62  12/17/20 64  09/16/20 61   Pain Assessment Pain Score: 0-No pain/10  Physical exam not performed in light of telephone consult visit format.   KPS = 90  100 - Normal; no complaints; no evidence of disease. 90   - Able to carry on normal activity; minor signs or symptoms of disease. 80   -  Normal activity with effort; some signs or symptoms of disease. 40   - Cares for self; unable to carry on normal activity or to do active work. 60   - Requires occasional assistance, but is able to care for most of his personal needs. 50   - Requires considerable assistance and frequent medical care. 61   - Disabled; requires special care and assistance. 89   - Severely disabled; hospital admission is indicated although death not imminent. 82   - Very sick; hospital admission necessary; active supportive treatment necessary. 10   - Moribund; fatal processes progressing rapidly. 0     - Dead  Karnofsky DA, Abelmann [place], Craver LS and Burchenal [facility] [phone]) The use of the nitrogen mustards in the palliative treatment of carcinoma: with particular reference to bronchogenic carcinoma Cancer 1 634-56  LABORATORY DATA:  Lab Results  Component Value Date   WBC 5.4 12/15/2020   HGB 14.0 12/15/2020   HCT 42.1 12/15/2020   MCV 92.3 12/15/2020   PLT 169 12/15/2020   Lab Results  Component Value Date   NA 141 12/15/2020   K 4.7 12/15/2020   CL 107 12/15/2020   CO2 25 12/15/2020   Lab Results  Component Value Date   ALT 10 12/15/2020   AST 17 12/15/2020   ALKPHOS 76 12/15/2020   BILITOT 0.3 12/15/2020     RADIOGRAPHY: No results found.    IMPRESSION/PLAN: This visit was conducted via Telephone to spare the patient unnecessary potential exposure in the healthcare setting during the current COVID-19 pandemic. 1. 87 y.o. 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 Today we reviewed the findings and workup thus far.  We discussed the natural history of castrate resistant prostate cancer.  We reviewed the the implications of positive margins, extracapsular extension, and seminal vesicle involvement on the risk of prostate cancer recurrence. We reviewed the results of his recent PSA and CT scan. We  discussed radiation treatment directed to the prostatic fossa with regard to the logistics and delivery of external beam radiation treatment.  The recommendation is to proceed with a PSMA scan to further assess the quantity of disease recurrence  but pending this appears to be a local recurrence without evidence of distant metastases, we would anticipate moving forward with a 4 week course of daily external beam radiation therapy to the prostate bed (STAMPEDE trial).  At the end of the conversation the patient is interested in moving forward with the 4 week course of daily external beam therapy to the prostate fossa pending there are no unanticipated findings to suggest distant metastatic disease on PSMA scan. We will share our discussion with Dr. Alen Blew and make arrangements for PSMA scan, first available.  We will plan to follow-up with the patient by telephone once we have the scan results to finalize our treatment recommendations and move forward with treatment planning at that time.  The patient appears to have a good understanding of his disease and our recommendations and is in agreement with the stated plan.  We enjoyed meeting him and his wife today and look forward to continuing to participate in his care.  Given current concerns for patient exposure during the COVID-19 pandemic, this encounter was conducted via telephone. The patient was notified in advance and was offered a MyChart meeting to allow for face to face communication but unfortunately reported that he did not have the appropriate resources/technology to support such a visit and instead preferred to proceed with telephone consult. The patient has given verbal consent for this type of encounter. The time spent during this encounter was 45 minutes. The attendants for this meeting include Tyler Pita MD, Nicoya Friel PA-C, Frost, and patient, KASH DAVIE Sr. and his wife. During the encounter, Tyler Pita  MD, Jillane Po PA-C, and scribe, Wilburn Mylar were located at [place].  Patient, GWENDOLYN NISHI Sr. and his wife were located at home.    Nicholos Johns, PA-C    Tyler Pita, MD  [facility] Direct Dial: [phone]  Fax: [phone] [email or web address]  Skype  [email or web address]   This document serves as a record of services personally performed by Tyler Pita, MD and Freeman Caldron, PA-C. It was created on their behalf by Wilburn Mylar, a trained medical scribe. The creation of this record is based on the scribe's personal observations and the provider's statements to them. This document has been checked and approved by the attending provider.

## 2021-01-18 ENCOUNTER — Other Ambulatory Visit: Payer: Self-pay | Admitting: Internal Medicine

## 2021-01-19 ENCOUNTER — Ambulatory Visit: Payer: Medicare Other

## 2021-01-20 ENCOUNTER — Telehealth: Payer: Self-pay | Admitting: *Deleted

## 2021-01-20 NOTE — Telephone Encounter (Signed)
Called patient to inform of PSMA  Pet  Scan for 02-02-21- arrival time - 1:30 pm @ [facility], no restrictions to test, spoke with patient and he is aware of this scan

## 2021-01-25 ENCOUNTER — Other Ambulatory Visit: Payer: Self-pay | Admitting: Internal Medicine

## 2021-01-26 ENCOUNTER — Other Ambulatory Visit: Payer: Self-pay | Admitting: Internal Medicine

## 2021-01-26 ENCOUNTER — Telehealth: Payer: Self-pay | Admitting: Internal Medicine

## 2021-01-26 MED ORDER — FLUTICASONE PROPIONATE 50 MCG/ACT NA SUSP: NASAL | 0 refills | Status: DC

## 2021-01-26 NOTE — Telephone Encounter (Signed)
fluticasone (FLONASE) 50 MCG/ACT nasal spray  Wondering if we can send in another refill for the medication, when he got the new bottle they thought they were throwing away the old one but accidentally threw away the new bottle   [id number] [address] Phone:  [phone]  Fax:  [phone]

## 2021-01-26 NOTE — Telephone Encounter (Signed)
Medication has been sent to the patient's pharmacy.  

## 2021-01-27 ENCOUNTER — Encounter: Payer: Medicare Other | Admitting: Internal Medicine

## 2021-01-28 ENCOUNTER — Other Ambulatory Visit: Payer: Self-pay | Admitting: Internal Medicine

## 2021-01-30 ENCOUNTER — Other Ambulatory Visit: Payer: Self-pay | Admitting: Internal Medicine

## 2021-02-02 ENCOUNTER — Other Ambulatory Visit: Payer: Self-pay

## 2021-02-02 ENCOUNTER — Telehealth: Payer: Self-pay | Admitting: Internal Medicine

## 2021-02-02 ENCOUNTER — Ambulatory Visit (HOSPITAL_COMMUNITY)
Admission: RE | Admit: 2021-02-02 | Discharge: 2021-02-02 | Disposition: A | Discharge status: Home / Self Care | Payer: Medicare Other | Source: Ambulatory Visit | Attending: Urology | Admitting: Urology

## 2021-02-02 ENCOUNTER — Other Ambulatory Visit: Payer: Self-pay | Admitting: Internal Medicine

## 2021-02-02 DIAGNOSIS — Z191 Hormone sensitive malignancy status: Secondary | ICD-10-CM | POA: Diagnosis not present

## 2021-02-02 DIAGNOSIS — C61 Malignant neoplasm of prostate: Secondary | ICD-10-CM | POA: Diagnosis present

## 2021-02-02 MED ORDER — PIFLIFOLASTAT F 18 (PYLARIFY) INJECTION
9.0000 | Freq: Once | INTRAVENOUS | Status: AC
  Administered 2021-02-02: 9.45 via INTRAVENOUS

## 2021-02-02 NOTE — Telephone Encounter (Signed)
Would need visit. Cannot refill controlled substance.

## 2021-02-02 NOTE — Telephone Encounter (Signed)
[facility] Call : ---Caller states her husband is out of his prescription- zolpidem, 10mg 

## 2021-02-02 NOTE — Telephone Encounter (Signed)
Patient was last seen in office on 06/07/2019. No pending appointment.

## 2021-02-03 ENCOUNTER — Encounter: Payer: Medicare Other | Admitting: Internal Medicine

## 2021-02-03 ENCOUNTER — Encounter: Payer: Self-pay | Admitting: Internal Medicine

## 2021-02-03 ENCOUNTER — Ambulatory Visit (INDEPENDENT_AMBULATORY_CARE_PROVIDER_SITE_OTHER): Payer: Medicare Other | Admitting: Internal Medicine

## 2021-02-03 VITALS — BP 140/88 | HR 67 | Temp 98.0°F | Ht 68.0 in | Wt 156.0 lb

## 2021-02-03 DIAGNOSIS — Z0000 Encounter for general adult medical examination without abnormal findings: Secondary | ICD-10-CM

## 2021-02-03 DIAGNOSIS — I10 Essential (primary) hypertension: Secondary | ICD-10-CM | POA: Diagnosis not present

## 2021-02-03 DIAGNOSIS — F5101 Primary insomnia: Secondary | ICD-10-CM

## 2021-02-03 DIAGNOSIS — E782 Mixed hyperlipidemia: Secondary | ICD-10-CM | POA: Diagnosis not present

## 2021-02-03 DIAGNOSIS — F325 Major depressive disorder, single episode, in full remission: Secondary | ICD-10-CM

## 2021-02-03 DIAGNOSIS — F329 Major depressive disorder, single episode, unspecified: Secondary | ICD-10-CM | POA: Insufficient documentation

## 2021-02-03 DIAGNOSIS — I700 Atherosclerosis of aorta: Secondary | ICD-10-CM | POA: Diagnosis not present

## 2021-02-03 MED ORDER — ZOLPIDEM TARTRATE 10 MG PO TABS
10.0000 mg | ORAL_TABLET | Freq: Every day | ORAL | 1 refills | Status: DC
Start: 2021-02-03 — End: 2021-06-30

## 2021-02-03 MED ORDER — SIMVASTATIN 20 MG PO TABS: 20.0000 mg | ORAL_TABLET | Freq: Every day | ORAL | 3 refills | Status: DC

## 2021-02-03 NOTE — Assessment & Plan Note (Signed)
Mild and controlled with zoloft 50 mg daily.

## 2021-02-03 NOTE — Assessment & Plan Note (Signed)
Checking lipid panel and adjust simvastatin 20 mg daily as needed.  

## 2021-02-03 NOTE — Patient Instructions (Signed)
We will check the cholesterol levels.    Health Maintenance, Male Adopting a healthy lifestyle and getting preventive care are important in promoting health and wellness. Ask your health care provider about:  The right schedule for you to have regular tests and exams.  Things you can do on your own to prevent diseases and keep yourself healthy. What should I know about diet, weight, and exercise? Eat a healthy diet  Eat a diet that includes plenty of vegetables, fruits, low-fat dairy products, and lean protein.  Do not eat a lot of foods that are high in solid fats, added sugars, or sodium.   Maintain a healthy weight Body mass index (BMI) is a measurement that can be used to identify possible weight problems. It estimates body fat based on height and weight. Your health care provider can help determine your BMI and help you achieve or maintain a healthy weight. Get regular exercise Get regular exercise. This is one of the most important things you can do for your health. Most adults should:  Exercise for at least 150 minutes each week. The exercise should increase your heart rate and make you sweat (moderate-intensity exercise).  Do strengthening exercises at least twice a week. This is in addition to the moderate-intensity exercise.  Spend less time sitting. Even light physical activity can be beneficial. Watch cholesterol and blood lipids Have your blood tested for lipids and cholesterol at 85 years of age, then have this test every 5 years. You may need to have your cholesterol levels checked more often if:  Your lipid or cholesterol levels are high.  You are older than 85 years of age.  You are at high risk for heart disease. What should I know about cancer screening? Many types of cancers can be detected early and may often be prevented. Depending on your health history and family history, you may need to have cancer screening at various ages. This may include screening  for:  Colorectal cancer.  Prostate cancer.  Skin cancer.  Lung cancer. What should I know about heart disease, diabetes, and high blood pressure? Blood pressure and heart disease  High blood pressure causes heart disease and increases the risk of stroke. This is more likely to develop in people who have high blood pressure readings, are of African descent, or are overweight.  Talk with your health care provider about your target blood pressure readings.  Have your blood pressure checked: ? Every 3-5 years if you are 58-13 years of age. ? Every year if you are 42 years old or older.  If you are between the ages of 59 and 68 and are a current or former smoker, ask your health care provider if you should have a one-time screening for abdominal aortic aneurysm (AAA). Diabetes Have regular diabetes screenings. This checks your fasting blood sugar level. Have the screening done:  Once every three years after age 26 if you are at a normal weight and have a low risk for diabetes.  More often and at a younger age if you are overweight or have a high risk for diabetes. What should I know about preventing infection? Hepatitis B If you have a higher risk for hepatitis B, you should be screened for this virus. Talk with your health care provider to find out if you are at risk for hepatitis B infection. Hepatitis C Blood testing is recommended for:  Everyone born from [birth year] through 1963.  Anyone with known risk factors for hepatitis C. Sexually  transmitted infections (STIs)  You should be screened each year for STIs, including gonorrhea and chlamydia, if: ? You are sexually active and are younger than 85 years of age. ? You are older than 85 years of age and your health care provider tells you that you are at risk for this type of infection. ? Your sexual activity has changed since you were last screened, and you are at increased risk for chlamydia or gonorrhea. Ask your health care  provider if you are at risk.  Ask your health care provider about whether you are at high risk for HIV. Your health care provider may recommend a prescription medicine to help prevent HIV infection. If you choose to take medicine to prevent HIV, you should first get tested for HIV. You should then be tested every 3 months for as long as you are taking the medicine. Follow these instructions at home: Lifestyle  Do not use any products that contain nicotine or tobacco, such as cigarettes, e-cigarettes, and chewing tobacco. If you need help quitting, ask your health care provider.  Do not use street drugs.  Do not share needles.  Ask your health care provider for help if you need support or information about quitting drugs. Alcohol use  Do not drink alcohol if your health care provider tells you not to drink.  If you drink alcohol: ? Limit how much you have to 0-2 drinks a day. ? Be aware of how much alcohol is in your drink. In the [place] one drink equals one 12 oz bottle of beer (355 mL), one 5 oz glass of wine (148 mL), or one 1 oz glass of hard liquor (44 mL). General instructions  Schedule regular health, dental, and eye exams.  Stay current with your vaccines.  Tell your health care provider if: ? You often feel depressed. ? You have ever been abused or do not feel safe at home. Summary  Adopting a healthy lifestyle and getting preventive care are important in promoting health and wellness.  Follow your health care provider's instructions about healthy diet, exercising, and getting tested or screened for diseases.  Follow your health care provider's instructions on monitoring your cholesterol and blood pressure. This information is not intended to replace advice given to you by your health care provider. Make sure you discuss any questions you have with your health care provider. Document Revised: 10/18/2018 Document Reviewed: 10/18/2018 Elsevier Patient Education  2021  [employer].

## 2021-02-03 NOTE — Addendum Note (Signed)
Encounter addended by: Freeman Caldron, PA-C on: 02/03/2021 1:42 PM  Actions taken: Clinical Note Signed

## 2021-02-03 NOTE — Assessment & Plan Note (Signed)
Flu shot declines. Covid-19 up to date including booster. Pneumonia complete. Shingrix counseled. Tetanus due 2029. Colonoscopy aged out. Counseled about sun safety and mole surveillance. Counseled about the dangers of distracted driving. Given 10 year screening recommendations.

## 2021-02-03 NOTE — Telephone Encounter (Signed)
Spoke with the patient and he has been scheduled to be seen by Dr. Sharlet Salina today at 4pm. No other questions or concerns.

## 2021-02-03 NOTE — Assessment & Plan Note (Signed)
On statin, continue.

## 2021-02-03 NOTE — Progress Notes (Signed)
Subjective:   Patient ID: Randy Boston Sr., male    DOB: 28-Jan-1930, 87 y.o.   MRN: [id number]  HPI Here for medicare wellness and physical, no new complaints. Please see A/P for status and treatment of chronic medical problems.   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 The patient's medical and social history - reviewed today no changes 2. Their use of alcohol, tobacco or illicit drugs 3. Their current medications and supplements 4. The patient's functional ability including ADL's, fall risks, home safety risks and hearing or visual impairment. 5. Diet and physical activities 6. Evidence for depression or mood disorders 7. Care team reviewed and updated 8.  The patient is not on an opioid pain medication.  Patient Care Team: [name], MD as PCP - General (Internal Medicine) Cira Rue, RN Nurse Navigator as Registered Nurse (Medical Oncology) Past Medical History:  Diagnosis Date  . Acute MI ([place])   . Arthritis   . Coronary artery disease   . GERD (gastroesophageal reflux disease)   . Hyperlipidemia   . Hypertension   . Prostate cancer ([place])   . Wears glasses    Past Surgical History:  Procedure Laterality Date  . CORONARY ANGIOPLASTY     11/30/94 (Dr. Daneen Schick): Mild ant/anteroapical hypokinesis (known ant MI '94), EF 60%. 50-60%pLAD, 60% RI, 50% oRCA, 90% OM1 (s/p PTCA '96)  . KNEE ARTHROSCOPY Right 03/01/2018   Procedure: ARTHROSCOPY KNEE;  Surgeon: Frederik Pear, MD;   Location: [facility];  Service: Orthopedics;  Laterality: Right;  debridement of medial and lateral meniscal tears   Family History  Problem Relation Age of Onset  . Bone cancer Father   . Breast cancer Neg Hx   . Colon cancer Neg Hx   . Prostate cancer Neg Hx   . Pancreatic cancer Neg Hx    Review of Systems  Constitutional: Negative.   HENT: Positive for hearing loss.   Eyes: Negative.   Respiratory: Negative for cough, chest tightness and shortness of breath.   Cardiovascular: Negative for chest pain, palpitations and leg swelling.  Gastrointestinal: Negative for abdominal distention, abdominal pain, constipation, diarrhea, nausea and vomiting.  Musculoskeletal: Negative.   Skin: Negative.   Neurological: Negative.   Psychiatric/Behavioral: Negative.     Objective:  Physical Exam Constitutional:      Appearance: He is well-developed.  HENT:     Head: Normocephalic and atraumatic.     Comments: Hard of hearing Cardiovascular:     Rate and Rhythm: Normal rate and regular rhythm.  Pulmonary:     Effort: Pulmonary effort is normal. No respiratory distress.     Breath sounds: Normal breath sounds. No wheezing or rales.  Abdominal:     General: Bowel sounds are normal. There is no distension.     Palpations: Abdomen is soft.     Tenderness: There is no abdominal tenderness. There is no rebound.  Musculoskeletal:     Cervical back: Normal range of motion.  Skin:    General: Skin is warm and dry.  Neurological:  Mental Status: He is alert and oriented to person, place, and time.     Coordination: Coordination normal.     Vitals:   02/03/21 1557  BP: 140/88  Pulse: 67  Temp: 98 F (36.7 C)  TempSrc: Oral  SpO2: 99%  Weight: 156 lb (70.8 kg)  Height: 5\' 8"  (1.727 m)   This visit occurred during the SARS-CoV-2 public health emergency.  Safety protocols were in place, including screening questions prior to the visit, additional usage of staff PPE, and extensive  cleaning of exam room while observing appropriate contact time as indicated for disinfecting solutions.   Assessment & Plan:

## 2021-02-03 NOTE — Assessment & Plan Note (Signed)
Refill ambien, denies side effects and understands risk and benefit.

## 2021-02-03 NOTE — Assessment & Plan Note (Signed)
BP at goal on metoprolol. Refill as needed.

## 2021-02-04 LAB — LIPID PANEL
Cholesterol: 159 mg/dL (ref 0–200)
HDL: 33.1 mg/dL — ABNORMAL LOW (ref >39.00)
NonHDL: 126.03
Total CHOL/HDL Ratio: 5
Triglycerides: 263 mg/dL — ABNORMAL HIGH (ref 0.0–149.0)
VLDL: 52.6 mg/dL — ABNORMAL HIGH (ref 0.0–40.0)

## 2021-02-04 LAB — LDL CHOLESTEROL, DIRECT: Direct LDL: 92 mg/dL

## 2021-02-05 ENCOUNTER — Telehealth: Payer: Self-pay | Admitting: Oncology

## 2021-02-05 NOTE — Telephone Encounter (Signed)
R/s appts per 3/31 sch msg. Pt's wife is aware.  

## 2021-02-15 ENCOUNTER — Other Ambulatory Visit: Payer: Self-pay | Admitting: Internal Medicine

## 2021-02-19 ENCOUNTER — Telehealth: Payer: Self-pay | Admitting: Oncology

## 2021-02-19 NOTE — Telephone Encounter (Signed)
Scheduled per 04/14 staff message, patient has been called and notified.

## 2021-02-23 ENCOUNTER — Inpatient Hospital Stay: Payer: Medicare Other | Attending: Oncology | Admitting: Oncology

## 2021-02-23 DIAGNOSIS — Z8546 Personal history of malignant neoplasm of prostate: Secondary | ICD-10-CM

## 2021-02-23 NOTE — Progress Notes (Signed)
Hematology and Oncology Follow Up for Telemedicine Visits  Randy [name] [id number] 07/17/1930 87 y.o. 02/23/2021 9:08 AM [name], [name], Real [place], *   I connected with Randy [name] on 02/23/21 at  9:30 AM EDT by telephone visit and verified that I am speaking with the correct person using two identifiers.   I discussed the limitations, risks, security and privacy concerns of performing an evaluation and management service by telemedicine and the availability of in-person appointments. I also discussed with the patient that there may be a patient responsible charge related to this service. The patient expressed understanding and agreed to proceed.  Other persons participating in the visit and their role in the encounter:    Patient's location: Home Provider's location: Office    Principle Diagnosis:  87 year old man with castration-resistant advanced prostate noted in 2018.  He was found to have found to have Gleason score 4+3 = 7 and a PSA of 6.8 in 1991.  He has recurrent pelvic recurrence with lymphadenopathy and isolated rib metastasis.   Prior Therapy:  At that time he underwent a radical prostatectomy for a Gleason score 4+3 = 7 and a PSA of 6.8. He subsequently developed a biochemical relapse and required androgen deprivation therapy prior 2016. He subsequently developed castration resistant disease with a rise in his PSA up to 6.63 in July 2018. Apalutamide was started and he has been on it since that time. His nadir of PSA was 3.06 in February 2019. His PSA was up to 6.29 in August 2019 and in December 2019 was up to 7.87  Current therapy: Apalutamide 240 mg daily started July 2018.  Interim History: [name]  reports feeling reasonably fair without any major complaints.  He continues to be active and attends to activities of daily living without any decline.  Evaluation bone pain or pathological fractures.  He denies any hospitalizations or  illnesses.   Medications: I have reviewed the patient's current medications.  Current Outpatient Medications  Medication Sig Dispense Refill  . ASPERCREME LIDOCAINE EX Apply 1 application topically daily as needed (pain).    Marland Kitchen aspirin 325 MG EC tablet Take 325 mg by mouth every morning.    . Calcium Carb-Cholecalciferol (CALCIUM 1000 + D PO) Take 1 tablet by mouth daily.    . ERLEADA 60 MG tablet Take 4 tablets (240 mg total) by mouth daily at 12 noon. 120 tablet 2  . fish oil-omega-3 fatty acids 1000 MG capsule Take 1 g by mouth 2 (two) times daily.     . fluticasone (FLONASE) 50 MCG/ACT nasal spray SPRAY 2 SPRAYS INTO EACH NOSTRIL EVERY DAY 48 mL 1  . LINZESS 145 MCG CAPS capsule TAKE 1 CAPSULE (145 MCG TOTAL) BY MOUTH DAILY BEFORE BREAKFAST. 30 capsule 2  . metoprolol tartrate (LOPRESSOR) 50 MG tablet TAKE 1 TABLET BY MOUTH TWICE A DAY 180 tablet 0  . niacin 500 MG tablet Take 500 mg by mouth at bedtime.    . nitroGLYCERIN (NITROSTAT) 0.4 MG SL tablet Place 1 tablet (0.4 mg total) under the tongue every 5 (five) minutes as needed for chest pain. 25 tablet 1  . sertraline (ZOLOFT) 50 MG tablet TAKE 1 TABLET BY MOUTH EVERY DAY IN THE MORNING 90 tablet 0  . simvastatin (ZOCOR) 20 MG tablet Take 1 tablet (20 mg total) by mouth daily at 6 PM. 90 tablet 3  . zolpidem (AMBIEN) 10 MG tablet Take 1 tablet (10 mg total) by mouth at bedtime. [place]  tablet 1   No current facility-administered medications for this visit.     Allergies:  Allergies  Allergen Reactions  . Nsaids     Had a heart attack, so can not take NSAIDS       Lab Results: Lab Results  Component Value Date   WBC 5.4 12/15/2020   HGB 14.0 12/15/2020   HCT 42.1 12/15/2020   MCV 92.3 12/15/2020   PLT 169 12/15/2020     Chemistry      Component Value Date/Time   NA 141 12/15/2020 1041   K 4.7 12/15/2020 1041   CL 107 12/15/2020 1041   CO2 25 12/15/2020 1041   BUN 16 12/15/2020 1041   CREATININE 1.04 12/15/2020 1041       Component Value Date/Time   CALCIUM 9.5 12/15/2020 1041   ALKPHOS 76 12/15/2020 1041   AST 17 12/15/2020 1041   ALT 10 12/15/2020 1041   BILITOT 0.3 12/15/2020 1041     IMPRESSION: 1. Status post prostatectomy, with locally recurrent disease, as on prior CT. 2. Bilateral pelvic nodal metastasis. 3. Isolated anterior right fifth rib osseous metastasis. 4. Incidental findings, including: Coronary artery atherosclerosis. Aortic Atherosclerosis (ICD10-I70.0). Borderline ascending aortic aneurysm. Right adrenal adenoma.    Impression and Plan:  87 year old man with:  1.  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   His disease status was discussed at this time and treatment options were reviewed.  PET scan obtained on February 02, 2021 was personally reviewed.  He has predominantly local recurrence with pelvic adenopathy bilaterally.  He has also an isolated anterior right fifth rib lesion.  Treatment options at this time were discussed.  Local radiation therapy to the pelvis and lymphadenopathy and possibly the isolated rib lesion is 1 approach.  Systemic therapy would be in the form of Taxotere at this time.  Complication associated with Taxotere chemotherapy includes nausea, vomiting, also depression, neutropenia and neuropathy.  After discussion today, I recommended proceeding with radiation therapy and deferring chemotherapy from a later date.  2. Androgen deprivation therapy:  I recommended continuing this indefinitely.  Next injection will be in July 2022.   3.  Prognosis and goals of care: Therapy remains palliative although aggressive measures are warranted.  4. Follow-up:  Will be in July 2022 for repeat evaluation.  This will be sooner if any issues arise.    I discussed the assessment and treatment plan with the patient. The patient was provided an opportunity to ask questions and all were answered. The patient  agreed with the plan and demonstrated an understanding of the instructions.   The patient was advised to call back or seek an in-person evaluation if the symptoms worsen or if the condition fails to improve as anticipated.  I provided 20 minutes of non face-to-face telephone visit time during this encounter.  This time was dedicated to reviewing imaging studies, treatment options and future plan of care and review.   Zola Button, MD 02/23/2021 9:08 AM

## 2021-03-02 ENCOUNTER — Telehealth: Payer: Self-pay | Admitting: Radiation Oncology

## 2021-03-02 NOTE — Telephone Encounter (Signed)
Received voicemail message from patient's wife, Jeanett Schlein, requesting return call. Phoned back to inquire. Jeanett Schlein questions when her husbands treatment appointments are. Uncertain of this I explained I would inquire and phone her back. Shadad's last note indicates he recommends radiation therapy. She verbalized understanding and appreciation for the call.

## 2021-03-11 ENCOUNTER — Other Ambulatory Visit: Payer: Self-pay

## 2021-03-11 ENCOUNTER — Ambulatory Visit
Admission: RE | Admit: 2021-03-11 | Discharge: 2021-03-11 | Disposition: A | Discharge status: Home / Self Care | Payer: Medicare Other | Source: Ambulatory Visit | Attending: Radiation Oncology | Admitting: Radiation Oncology

## 2021-03-11 DIAGNOSIS — Z510 Encounter for antineoplastic radiation therapy: Secondary | ICD-10-CM | POA: Diagnosis not present

## 2021-03-11 DIAGNOSIS — C61 Malignant neoplasm of prostate: Secondary | ICD-10-CM | POA: Insufficient documentation

## 2021-03-11 DIAGNOSIS — C7951 Secondary malignant neoplasm of bone: Secondary | ICD-10-CM | POA: Diagnosis not present

## 2021-03-11 DIAGNOSIS — C775 Secondary and unspecified malignant neoplasm of intrapelvic lymph nodes: Secondary | ICD-10-CM | POA: Diagnosis not present

## 2021-03-11 DIAGNOSIS — Z191 Hormone sensitive malignancy status: Secondary | ICD-10-CM | POA: Insufficient documentation

## 2021-03-12 ENCOUNTER — Telehealth: Payer: Self-pay | Admitting: Radiation Oncology

## 2021-03-12 NOTE — Telephone Encounter (Signed)
Received voicemail message from patient's wife, Jeanett Schlein, inquiring if her husband may drive to and from his appointments. Phoned their home. No answer and no option to leave a message. Phoned their cell. No answer. Left voicemail message explaining he may drive to and from his radiation treatment appointments.

## 2021-03-12 NOTE — Progress Notes (Signed)
  Radiation Oncology         [phone] ________________________________  Name: Randy Boston Sr. MRN: [id number]  Date: 03/11/2021  DOB: [birth year]  SIMULATION AND TREATMENT PLANNING NOTE    ICD-10-CM   1. 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  NARRATIVE:  The patient was brought to the [facility].  Identity was confirmed.  All relevant records and images related to the planned course of therapy were reviewed.  The patient freely provided informed written consent to proceed with treatment after reviewing the details related to the planned course of therapy. The consent form was witnessed and verified by the simulation staff.  Then, the patient was set-up in a stable reproducible supine position for radiation therapy.  A vacuum lock pillow device was custom fabricated to position his legs in a reproducible immobilized position.  Then, I performed a urethrogram under sterile conditions to identify the prostatic apex.  CT images were obtained.  Surface markings were placed.  The CT images were loaded into the planning software.  Then the prostate target and avoidance structures including the rectum, bladder, bowel and hips were contoured.  Treatment planning then occurred.  The radiation prescription was entered and confirmed.  A total of one complex treatment devices was fabricated. I have requested : Intensity Modulated Radiotherapy (IMRT) is medically necessary for this case for the following reason:  Rectal sparing.  STEREOTACTIC BODY RADIOTHERAPY PLANNING:   Then, the patient was set-up in a stable reproducible  supine position for stereotactic body radiation therapy.  A BodyFix immobilization pillow was fabricated for reproducible positioning.  Surface markings  were placed.  The CT images were loaded into the planning software.  The gross target volumes (GTV) and planning target volumes (PTV) were delinieated, and avoidance structures were contoured.  Treatment planning then occurred.  The radiation prescription was entered and confirmed.  A total of two complex treatment devices were fabricated in the form of the BodyFix immobilization pillow and a neck accuform cushion.  I have requested : 3D Simulation  I have requested a DVH of the following structures: targets and all normal structures near the target including left lung, right lung, spinal cord, heart and skin as noted on the radiation plan to maintain doses in adherence with established limits  SPECIAL TREATMENT PROCEDURE:  The planned course of therapy using radiation constitutes a special treatment procedure. Special care is required in the management of this patient for the following reasons. High dose per fraction requiring special monitoring for increased toxicities of treatment including daily imaging..  The special nature of the planned course of radiotherapy will require increased physician supervision and oversight to ensure patient's safety with optimal treatment outcomes.  PLAN:  The patient will receive 68.3 Gy in 38 fractions to the pelvic nodal echelons and 79.8 Gy in 38 fractions to gross recurrence lesions in the pelvis .  He will receive 50 Gy in 5 fractions to the rib.  ________________________________  Sheral Apley. Tammi Klippel, M.D.

## 2021-03-13 ENCOUNTER — Telehealth: Payer: Self-pay | Admitting: Radiation Oncology

## 2021-03-13 ENCOUNTER — Telehealth: Payer: Self-pay | Admitting: Internal Medicine

## 2021-03-13 MED ORDER — PROMETHAZINE HCL 25 MG PO TABS: 25.0000 mg | ORAL_TABLET | Freq: Three times a day (TID) | ORAL | 0 refills | Status: DC | PRN

## 2021-03-13 NOTE — Telephone Encounter (Signed)
Spoke with the patient's wife and verbalized understanding. No other questions or concerns at this time.

## 2021-03-13 NOTE — Telephone Encounter (Signed)
Has he been taking his [place]? We can't add another sleep medicine on this but he could try switching or can add melatonin over the counter to the [place] to help.

## 2021-03-13 NOTE — Telephone Encounter (Signed)
He can try increasing to 2 pills ambien at night time but this is not safe for long term usage.

## 2021-03-13 NOTE — Telephone Encounter (Signed)
Patient has been very restless about starting radiation and hasn't been sleeping, patients wife wondering if we can send him in some medication to help with this.   [id number] [place], [place] Phone:  [phone]  Fax:  [phone]

## 2021-03-13 NOTE — Telephone Encounter (Signed)
Spoke with the patient's wife and she mentioned that he is currently taking his [place], she would like to have his dose increased. She stated that he has tried melatonin and doesn't work. She also wants to know can be prescribed something nausea, she stated a nurse at cancer center mentioned promethazine.

## 2021-03-13 NOTE — Telephone Encounter (Signed)
See below

## 2021-03-13 NOTE — Telephone Encounter (Signed)
Received another voicemail message from the patient's wife, Jeanett Schlein, requesting a return call. Phoned to inquire. Explained again her husband may drive to and from his radiation appointments. Explained his PCP should advise reference them increasing his sleeping medication. Assured her his sleeping medication at whatever dose will not interfere with the absorption of his radiation therapy. Finally, reviewed potential side effects and management of treatment with her. She verbalized understanding of all reviewed and appreciation for the return call.

## 2021-03-16 DIAGNOSIS — C7951 Secondary malignant neoplasm of bone: Secondary | ICD-10-CM | POA: Diagnosis not present

## 2021-03-16 DIAGNOSIS — Z510 Encounter for antineoplastic radiation therapy: Secondary | ICD-10-CM | POA: Diagnosis not present

## 2021-03-16 DIAGNOSIS — Z191 Hormone sensitive malignancy status: Secondary | ICD-10-CM | POA: Diagnosis not present

## 2021-03-16 DIAGNOSIS — C61 Malignant neoplasm of prostate: Secondary | ICD-10-CM | POA: Diagnosis not present

## 2021-03-17 ENCOUNTER — Other Ambulatory Visit: Payer: Self-pay | Admitting: *Deleted

## 2021-03-17 DIAGNOSIS — Z8546 Personal history of malignant neoplasm of prostate: Secondary | ICD-10-CM

## 2021-03-17 MED ORDER — ERLEADA 60 MG PO TABS: 240.0000 mg | ORAL_TABLET | Freq: Every day | ORAL | 0 refills | Status: DC

## 2021-03-23 ENCOUNTER — Ambulatory Visit
Admission: RE | Admit: 2021-03-23 | Discharge: 2021-03-23 | Disposition: A | Discharge status: Home / Self Care | Payer: Medicare Other | Source: Ambulatory Visit | Attending: Radiation Oncology | Admitting: Radiation Oncology

## 2021-03-23 DIAGNOSIS — C61 Malignant neoplasm of prostate: Secondary | ICD-10-CM

## 2021-03-23 DIAGNOSIS — Z510 Encounter for antineoplastic radiation therapy: Secondary | ICD-10-CM | POA: Diagnosis not present

## 2021-03-23 DIAGNOSIS — Z191 Hormone sensitive malignancy status: Secondary | ICD-10-CM

## 2021-03-23 DIAGNOSIS — C775 Secondary and unspecified malignant neoplasm of intrapelvic lymph nodes: Secondary | ICD-10-CM | POA: Diagnosis not present

## 2021-03-24 ENCOUNTER — Other Ambulatory Visit: Payer: Self-pay

## 2021-03-24 ENCOUNTER — Ambulatory Visit
Admission: RE | Admit: 2021-03-24 | Discharge: 2021-03-24 | Disposition: A | Discharge status: Home / Self Care | Payer: Medicare Other | Source: Ambulatory Visit | Attending: Radiation Oncology | Admitting: Radiation Oncology

## 2021-03-24 DIAGNOSIS — C775 Secondary and unspecified malignant neoplasm of intrapelvic lymph nodes: Secondary | ICD-10-CM | POA: Diagnosis not present

## 2021-03-24 DIAGNOSIS — C61 Malignant neoplasm of prostate: Secondary | ICD-10-CM | POA: Diagnosis not present

## 2021-03-24 DIAGNOSIS — Z191 Hormone sensitive malignancy status: Secondary | ICD-10-CM | POA: Diagnosis not present

## 2021-03-24 DIAGNOSIS — Z510 Encounter for antineoplastic radiation therapy: Secondary | ICD-10-CM | POA: Diagnosis not present

## 2021-03-25 ENCOUNTER — Telehealth: Payer: Self-pay | Admitting: *Deleted

## 2021-03-25 ENCOUNTER — Telehealth: Payer: Self-pay | Admitting: Internal Medicine

## 2021-03-25 ENCOUNTER — Ambulatory Visit
Admission: RE | Admit: 2021-03-25 | Discharge: 2021-03-25 | Disposition: A | Discharge status: Home / Self Care | Payer: Medicare Other | Source: Ambulatory Visit | Attending: Radiation Oncology | Admitting: Radiation Oncology

## 2021-03-25 DIAGNOSIS — C7951 Secondary malignant neoplasm of bone: Secondary | ICD-10-CM

## 2021-03-25 DIAGNOSIS — Z510 Encounter for antineoplastic radiation therapy: Secondary | ICD-10-CM | POA: Diagnosis not present

## 2021-03-25 DIAGNOSIS — C61 Malignant neoplasm of prostate: Secondary | ICD-10-CM | POA: Diagnosis not present

## 2021-03-25 DIAGNOSIS — C775 Secondary and unspecified malignant neoplasm of intrapelvic lymph nodes: Secondary | ICD-10-CM | POA: Diagnosis not present

## 2021-03-25 DIAGNOSIS — Z191 Hormone sensitive malignancy status: Secondary | ICD-10-CM | POA: Diagnosis not present

## 2021-03-25 NOTE — Telephone Encounter (Signed)
   Patient/spouse calling to report patient has started radiation and the sertraline (ZOLOFT) 50 MG tablet is not helping him maintain is emotions. The spouse is requesting dosage be increased.  Declined appointment at this time  Seeking advice

## 2021-03-25 NOTE — Telephone Encounter (Signed)
See below

## 2021-03-25 NOTE — Telephone Encounter (Signed)
PC to patient regarding letter faxed to this office from [place] regarding his Erleada rx - pharmacy has been unable to get in touch with patient regarding delivery of this medication.  RN called pharmacy, was instructed to call patient & instruct him to call pharmacy to set up medication delivery.  Patient informed of this situation, instructed to call [facility] [phone].  Patient verbalizes understanding.

## 2021-03-26 ENCOUNTER — Ambulatory Visit
Admission: RE | Admit: 2021-03-26 | Discharge: 2021-03-26 | Disposition: A | Discharge status: Home / Self Care | Payer: Medicare Other | Source: Ambulatory Visit | Attending: Radiation Oncology | Admitting: Radiation Oncology

## 2021-03-26 DIAGNOSIS — C7951 Secondary malignant neoplasm of bone: Secondary | ICD-10-CM | POA: Insufficient documentation

## 2021-03-26 DIAGNOSIS — C775 Secondary and unspecified malignant neoplasm of intrapelvic lymph nodes: Secondary | ICD-10-CM | POA: Diagnosis not present

## 2021-03-26 DIAGNOSIS — C61 Malignant neoplasm of prostate: Secondary | ICD-10-CM | POA: Diagnosis not present

## 2021-03-26 DIAGNOSIS — Z510 Encounter for antineoplastic radiation therapy: Secondary | ICD-10-CM | POA: Diagnosis not present

## 2021-03-26 DIAGNOSIS — Z191 Hormone sensitive malignancy status: Secondary | ICD-10-CM | POA: Diagnosis not present

## 2021-03-26 MED ORDER — SERTRALINE HCL 100 MG PO TABS: 100.0000 mg | ORAL_TABLET | Freq: Every day | ORAL | 3 refills | Status: DC

## 2021-03-26 NOTE — Telephone Encounter (Signed)
We will increase to 100 mg sertraline daily. Needs follow up in 3-4 weeks after dosage change.

## 2021-03-27 ENCOUNTER — Ambulatory Visit
Admission: RE | Admit: 2021-03-27 | Discharge: 2021-03-27 | Disposition: A | Discharge status: Home / Self Care | Payer: Medicare Other | Source: Ambulatory Visit | Attending: Radiation Oncology | Admitting: Radiation Oncology

## 2021-03-27 ENCOUNTER — Other Ambulatory Visit: Payer: Self-pay

## 2021-03-27 DIAGNOSIS — Z191 Hormone sensitive malignancy status: Secondary | ICD-10-CM | POA: Diagnosis not present

## 2021-03-27 DIAGNOSIS — C775 Secondary and unspecified malignant neoplasm of intrapelvic lymph nodes: Secondary | ICD-10-CM | POA: Diagnosis not present

## 2021-03-27 DIAGNOSIS — C61 Malignant neoplasm of prostate: Secondary | ICD-10-CM

## 2021-03-27 DIAGNOSIS — Z510 Encounter for antineoplastic radiation therapy: Secondary | ICD-10-CM | POA: Diagnosis not present

## 2021-03-27 DIAGNOSIS — C7951 Secondary malignant neoplasm of bone: Secondary | ICD-10-CM

## 2021-03-27 DIAGNOSIS — Z192 Hormone resistant malignancy status: Secondary | ICD-10-CM

## 2021-03-27 NOTE — Telephone Encounter (Signed)
Spoke with the patient's wife and she is aware of the dose change. She stated that she will call back Monday to schedule the follow up appointment.

## 2021-03-30 ENCOUNTER — Ambulatory Visit
Admission: RE | Admit: 2021-03-30 | Discharge: 2021-03-30 | Disposition: A | Discharge status: Home / Self Care | Payer: Medicare Other | Source: Ambulatory Visit | Attending: Radiation Oncology | Admitting: Radiation Oncology

## 2021-03-30 ENCOUNTER — Other Ambulatory Visit: Payer: Self-pay

## 2021-03-30 DIAGNOSIS — C775 Secondary and unspecified malignant neoplasm of intrapelvic lymph nodes: Secondary | ICD-10-CM | POA: Diagnosis not present

## 2021-03-30 DIAGNOSIS — C61 Malignant neoplasm of prostate: Secondary | ICD-10-CM

## 2021-03-30 DIAGNOSIS — Z191 Hormone sensitive malignancy status: Secondary | ICD-10-CM | POA: Diagnosis not present

## 2021-03-30 DIAGNOSIS — Z510 Encounter for antineoplastic radiation therapy: Secondary | ICD-10-CM | POA: Diagnosis not present

## 2021-03-30 DIAGNOSIS — C7951 Secondary malignant neoplasm of bone: Secondary | ICD-10-CM

## 2021-03-31 ENCOUNTER — Ambulatory Visit
Admission: RE | Admit: 2021-03-31 | Discharge: 2021-03-31 | Disposition: A | Discharge status: Home / Self Care | Payer: Medicare Other | Source: Ambulatory Visit | Attending: Radiation Oncology | Admitting: Radiation Oncology

## 2021-03-31 DIAGNOSIS — Z191 Hormone sensitive malignancy status: Secondary | ICD-10-CM | POA: Diagnosis not present

## 2021-03-31 DIAGNOSIS — Z510 Encounter for antineoplastic radiation therapy: Secondary | ICD-10-CM | POA: Diagnosis not present

## 2021-03-31 DIAGNOSIS — C775 Secondary and unspecified malignant neoplasm of intrapelvic lymph nodes: Secondary | ICD-10-CM | POA: Diagnosis not present

## 2021-03-31 DIAGNOSIS — C61 Malignant neoplasm of prostate: Secondary | ICD-10-CM | POA: Diagnosis not present

## 2021-04-01 ENCOUNTER — Telehealth: Payer: Self-pay | Admitting: Radiation Oncology

## 2021-04-01 ENCOUNTER — Ambulatory Visit
Admission: RE | Admit: 2021-04-01 | Discharge: 2021-04-01 | Disposition: A | Discharge status: Home / Self Care | Payer: Medicare Other | Source: Ambulatory Visit | Attending: Radiation Oncology | Admitting: Radiation Oncology

## 2021-04-01 ENCOUNTER — Telehealth: Payer: Self-pay | Admitting: *Deleted

## 2021-04-01 DIAGNOSIS — C61 Malignant neoplasm of prostate: Secondary | ICD-10-CM

## 2021-04-01 DIAGNOSIS — C775 Secondary and unspecified malignant neoplasm of intrapelvic lymph nodes: Secondary | ICD-10-CM | POA: Diagnosis not present

## 2021-04-01 DIAGNOSIS — Z191 Hormone sensitive malignancy status: Secondary | ICD-10-CM | POA: Diagnosis not present

## 2021-04-01 DIAGNOSIS — Z510 Encounter for antineoplastic radiation therapy: Secondary | ICD-10-CM | POA: Diagnosis not present

## 2021-04-01 DIAGNOSIS — C7951 Secondary malignant neoplasm of bone: Secondary | ICD-10-CM

## 2021-04-01 NOTE — Telephone Encounter (Signed)
Received voicemail message from patient's wife, Randy [name], requesting a return call. Phoned her back to inquire. She verbalizes concern she "isn't feeding her husband adequately during his radiation treatment." Offered appointment with nutritionist. Randy [name] explained she would like to speak with a nutritionist but it would have to be via phone since she is "on oxygen." Randy [name] understands this RN will request En[facility] Derry arrange a nutrition appointment via phone for them and phoned them with the appointment date and time. She verbalized appreciation for the call back.

## 2021-04-01 NOTE — Telephone Encounter (Signed)
CALLED PATIENT TO INFORM OF NUTRITION PHONE CALL ON 04-10-21 @ 9 AM, SPOKE WITH PATIENT AND HE VERIFIED UNDERSTANDING THIS TELEPHONE NUTRITION PHONE CALL

## 2021-04-02 ENCOUNTER — Other Ambulatory Visit: Payer: Self-pay

## 2021-04-02 ENCOUNTER — Ambulatory Visit
Admission: RE | Admit: 2021-04-02 | Discharge: 2021-04-02 | Disposition: A | Discharge status: Home / Self Care | Payer: Medicare Other | Source: Ambulatory Visit | Attending: Radiation Oncology | Admitting: Radiation Oncology

## 2021-04-02 DIAGNOSIS — Z510 Encounter for antineoplastic radiation therapy: Secondary | ICD-10-CM | POA: Diagnosis not present

## 2021-04-02 DIAGNOSIS — C775 Secondary and unspecified malignant neoplasm of intrapelvic lymph nodes: Secondary | ICD-10-CM | POA: Diagnosis not present

## 2021-04-02 DIAGNOSIS — C61 Malignant neoplasm of prostate: Secondary | ICD-10-CM | POA: Diagnosis not present

## 2021-04-02 DIAGNOSIS — Z191 Hormone sensitive malignancy status: Secondary | ICD-10-CM | POA: Diagnosis not present

## 2021-04-03 ENCOUNTER — Ambulatory Visit
Admission: RE | Admit: 2021-04-03 | Discharge: 2021-04-03 | Disposition: A | Discharge status: Home / Self Care | Payer: Medicare Other | Source: Ambulatory Visit | Attending: Radiation Oncology | Admitting: Radiation Oncology

## 2021-04-03 ENCOUNTER — Other Ambulatory Visit: Payer: Self-pay

## 2021-04-03 DIAGNOSIS — C775 Secondary and unspecified malignant neoplasm of intrapelvic lymph nodes: Secondary | ICD-10-CM | POA: Diagnosis not present

## 2021-04-03 DIAGNOSIS — Z191 Hormone sensitive malignancy status: Secondary | ICD-10-CM | POA: Diagnosis not present

## 2021-04-03 DIAGNOSIS — C61 Malignant neoplasm of prostate: Secondary | ICD-10-CM | POA: Diagnosis not present

## 2021-04-03 DIAGNOSIS — Z510 Encounter for antineoplastic radiation therapy: Secondary | ICD-10-CM | POA: Diagnosis not present

## 2021-04-03 NOTE — Progress Notes (Signed)
Pt here for patient teaching.    Pt given Radiation and You booklet and skin care instructions.    Reviewed areas of pertinence such as diarrhea, fatigue, hair loss, nausea and vomiting, sexual and fertility changes, skin changes and urinary and bladder changes .   Pt able to give teach back of to pat skin, use unscented/gentle soap and have Imodium on hand,avoid applying anything to skin within 4 hours of treatment.   Pt demonstrated understanding  of information given and will contact nursing with any questions or concerns.

## 2021-04-07 ENCOUNTER — Ambulatory Visit
Admission: RE | Admit: 2021-04-07 | Discharge: 2021-04-07 | Disposition: A | Discharge status: Home / Self Care | Payer: Medicare Other | Source: Ambulatory Visit | Attending: Radiation Oncology | Admitting: Radiation Oncology

## 2021-04-07 ENCOUNTER — Other Ambulatory Visit: Payer: Self-pay

## 2021-04-07 DIAGNOSIS — C61 Malignant neoplasm of prostate: Secondary | ICD-10-CM | POA: Diagnosis not present

## 2021-04-07 DIAGNOSIS — Z510 Encounter for antineoplastic radiation therapy: Secondary | ICD-10-CM | POA: Diagnosis not present

## 2021-04-07 DIAGNOSIS — Z191 Hormone sensitive malignancy status: Secondary | ICD-10-CM | POA: Diagnosis not present

## 2021-04-07 DIAGNOSIS — C775 Secondary and unspecified malignant neoplasm of intrapelvic lymph nodes: Secondary | ICD-10-CM | POA: Diagnosis not present

## 2021-04-08 ENCOUNTER — Telehealth: Payer: Self-pay | Admitting: Radiation Oncology

## 2021-04-08 ENCOUNTER — Ambulatory Visit
Admission: RE | Admit: 2021-04-08 | Discharge: 2021-04-08 | Disposition: A | Discharge status: Home / Self Care | Payer: Medicare Other | Source: Ambulatory Visit | Attending: Radiation Oncology | Admitting: Radiation Oncology

## 2021-04-08 DIAGNOSIS — C775 Secondary and unspecified malignant neoplasm of intrapelvic lymph nodes: Secondary | ICD-10-CM | POA: Diagnosis not present

## 2021-04-08 DIAGNOSIS — Z510 Encounter for antineoplastic radiation therapy: Secondary | ICD-10-CM | POA: Insufficient documentation

## 2021-04-08 DIAGNOSIS — Z191 Hormone sensitive malignancy status: Secondary | ICD-10-CM | POA: Insufficient documentation

## 2021-04-08 DIAGNOSIS — C61 Malignant neoplasm of prostate: Secondary | ICD-10-CM | POA: Diagnosis not present

## 2021-04-08 NOTE — Telephone Encounter (Signed)
Received call from patient's wife, Jeanett Schlein, requesting nutrition consult be cancelled. This RN did as requested. Jeanette verbalized appreciation for the assistance.

## 2021-04-09 ENCOUNTER — Ambulatory Visit
Admission: RE | Admit: 2021-04-09 | Discharge: 2021-04-09 | Disposition: A | Discharge status: Home / Self Care | Payer: Medicare Other | Source: Ambulatory Visit | Attending: Radiation Oncology | Admitting: Radiation Oncology

## 2021-04-09 ENCOUNTER — Other Ambulatory Visit: Payer: Self-pay

## 2021-04-09 DIAGNOSIS — C775 Secondary and unspecified malignant neoplasm of intrapelvic lymph nodes: Secondary | ICD-10-CM | POA: Diagnosis not present

## 2021-04-09 DIAGNOSIS — C61 Malignant neoplasm of prostate: Secondary | ICD-10-CM | POA: Diagnosis not present

## 2021-04-09 DIAGNOSIS — Z510 Encounter for antineoplastic radiation therapy: Secondary | ICD-10-CM | POA: Diagnosis not present

## 2021-04-09 DIAGNOSIS — Z191 Hormone sensitive malignancy status: Secondary | ICD-10-CM | POA: Diagnosis not present

## 2021-04-10 ENCOUNTER — Telehealth: Payer: Self-pay | Admitting: Internal Medicine

## 2021-04-10 ENCOUNTER — Ambulatory Visit
Admission: RE | Admit: 2021-04-10 | Discharge: 2021-04-10 | Disposition: A | Discharge status: Home / Self Care | Payer: Medicare Other | Source: Ambulatory Visit | Attending: Radiation Oncology | Admitting: Radiation Oncology

## 2021-04-10 ENCOUNTER — Encounter: Payer: Medicare Other | Admitting: Dietician

## 2021-04-10 DIAGNOSIS — Z510 Encounter for antineoplastic radiation therapy: Secondary | ICD-10-CM | POA: Diagnosis not present

## 2021-04-10 DIAGNOSIS — C61 Malignant neoplasm of prostate: Secondary | ICD-10-CM | POA: Diagnosis not present

## 2021-04-10 DIAGNOSIS — C775 Secondary and unspecified malignant neoplasm of intrapelvic lymph nodes: Secondary | ICD-10-CM | POA: Diagnosis not present

## 2021-04-10 DIAGNOSIS — Z191 Hormone sensitive malignancy status: Secondary | ICD-10-CM | POA: Diagnosis not present

## 2021-04-10 NOTE — Telephone Encounter (Signed)
1.Medication Requested: zolpidem (AMBIEN) 10 MG tablet    2. Pharmacy (Name, [address], [place]): [id number] [place], [place]  3. On Med List: yes   4. Last Visit with PCP: 02-03-21  5. Next visit date with PCP: n/a   Patients wife is requesting a new prescription that will allow the patient to 2 tablets. Please advise    Agent: Please be advised that RX refills may take up to 3 business days. We ask that you follow-up with your pharmacy.

## 2021-04-10 NOTE — Telephone Encounter (Signed)
See below

## 2021-04-10 NOTE — Telephone Encounter (Signed)
Should still have refill, given 6 month supply 02/03/21

## 2021-04-13 ENCOUNTER — Ambulatory Visit: Payer: Medicare Other

## 2021-04-13 ENCOUNTER — Telehealth: Payer: Self-pay | Admitting: Internal Medicine

## 2021-04-13 NOTE — Telephone Encounter (Signed)
[facility] FYI 6.4.22:  ---Caller states her husband has been on [place] 10mg  and states he only has 1 pill left and she is concerned he will need it and is requesting a refill. She states his dosage was increased to 2 pills at bedtime since starting radiation treatments. Caller denies husband having any symptoms or need for triage at this time but states he is needing the [place] 10mg  refilled. [phone]

## 2021-04-13 NOTE — Telephone Encounter (Signed)
Patient received a 90 day supply on 02/03/2021.

## 2021-04-13 NOTE — Telephone Encounter (Signed)
Spoke with the patient's wife and she stated that he is currently taking 2 tablets at bedtime. He is currently out of medication and she stated the pharmacy will not refill his medication. I advised that Dr. Sharlet Salina stated that its not safe for long term use. She verbalized understanding and wants to know what can be done. Please advise.

## 2021-04-13 NOTE — Telephone Encounter (Signed)
See other telephone message where this was reviewed and advised

## 2021-04-13 NOTE — Telephone Encounter (Signed)
Can we call his pharmacy and okay early refill of ambien. I would recommend to resume daily dosing if possible and if unable let us know.

## 2021-04-14 ENCOUNTER — Ambulatory Visit
Admission: RE | Admit: 2021-04-14 | Discharge: 2021-04-14 | Disposition: A | Discharge status: Home / Self Care | Payer: Medicare Other | Source: Ambulatory Visit | Attending: Radiation Oncology | Admitting: Radiation Oncology

## 2021-04-14 ENCOUNTER — Other Ambulatory Visit: Payer: Self-pay

## 2021-04-14 ENCOUNTER — Inpatient Hospital Stay: Payer: Medicare Other | Attending: Oncology | Admitting: Dietician

## 2021-04-14 DIAGNOSIS — C775 Secondary and unspecified malignant neoplasm of intrapelvic lymph nodes: Secondary | ICD-10-CM | POA: Diagnosis not present

## 2021-04-14 DIAGNOSIS — C61 Malignant neoplasm of prostate: Secondary | ICD-10-CM | POA: Diagnosis not present

## 2021-04-14 DIAGNOSIS — Z510 Encounter for antineoplastic radiation therapy: Secondary | ICD-10-CM | POA: Diagnosis not present

## 2021-04-14 DIAGNOSIS — Z191 Hormone sensitive malignancy status: Secondary | ICD-10-CM | POA: Diagnosis not present

## 2021-04-14 NOTE — Progress Notes (Signed)
Nutrition  Patient did not show for scheduled nutrition appointment. 

## 2021-04-14 NOTE — Telephone Encounter (Signed)
[facility] FYI 6.6.22  Caller states her husband is taking radiation treatments for his prostate. He was given a 90 day prescription for Zopedium to help him sleep. The Rx was not helping so the doctor told him that he is able to take up to 2 at a time. Now they are out and the office sent him the same Rx for the medication but her insurance does not cover the medication since the medication was sent in for 1 pill a day and they went through the RX faster than directed. Caller is needing something to help him sleep.  Advised to see PCP within 24 hours

## 2021-04-14 NOTE — Telephone Encounter (Signed)
Spoke with the pharmacy to ok a early refill per Dr. Sharlet Salina. Pharmacy stated that insurance may not cover it and the patient may need to pay out of pocket. Script should be ready in 1-2 hours.  Spoke with the patient's wife and made her aware that a new script has been called in and that insurance may not cover it and he may need to pay out of pocket. She is aware that Dr. Sharlet Salina wants him to resume taking one pill at bedtime. She verbalized understanding. No questions or concerns at this time.

## 2021-04-15 ENCOUNTER — Ambulatory Visit
Admission: RE | Admit: 2021-04-15 | Discharge: 2021-04-15 | Disposition: A | Discharge status: Home / Self Care | Payer: Medicare Other | Source: Ambulatory Visit | Attending: Radiation Oncology | Admitting: Radiation Oncology

## 2021-04-15 DIAGNOSIS — C775 Secondary and unspecified malignant neoplasm of intrapelvic lymph nodes: Secondary | ICD-10-CM | POA: Diagnosis not present

## 2021-04-15 DIAGNOSIS — C61 Malignant neoplasm of prostate: Secondary | ICD-10-CM | POA: Diagnosis not present

## 2021-04-15 DIAGNOSIS — Z191 Hormone sensitive malignancy status: Secondary | ICD-10-CM | POA: Diagnosis not present

## 2021-04-15 DIAGNOSIS — Z510 Encounter for antineoplastic radiation therapy: Secondary | ICD-10-CM | POA: Diagnosis not present

## 2021-04-16 ENCOUNTER — Ambulatory Visit
Admission: RE | Admit: 2021-04-16 | Discharge: 2021-04-16 | Disposition: A | Discharge status: Home / Self Care | Payer: Medicare Other | Source: Ambulatory Visit | Attending: Radiation Oncology | Admitting: Radiation Oncology

## 2021-04-16 ENCOUNTER — Other Ambulatory Visit: Payer: Self-pay

## 2021-04-16 DIAGNOSIS — C775 Secondary and unspecified malignant neoplasm of intrapelvic lymph nodes: Secondary | ICD-10-CM | POA: Diagnosis not present

## 2021-04-16 DIAGNOSIS — Z510 Encounter for antineoplastic radiation therapy: Secondary | ICD-10-CM | POA: Diagnosis not present

## 2021-04-16 DIAGNOSIS — Z191 Hormone sensitive malignancy status: Secondary | ICD-10-CM | POA: Diagnosis not present

## 2021-04-16 DIAGNOSIS — C61 Malignant neoplasm of prostate: Secondary | ICD-10-CM | POA: Diagnosis not present

## 2021-04-17 ENCOUNTER — Ambulatory Visit: Payer: Medicare Other

## 2021-04-18 ENCOUNTER — Other Ambulatory Visit: Payer: Self-pay | Admitting: Internal Medicine

## 2021-04-20 ENCOUNTER — Other Ambulatory Visit: Payer: Self-pay

## 2021-04-20 ENCOUNTER — Ambulatory Visit
Admission: RE | Admit: 2021-04-20 | Discharge: 2021-04-20 | Disposition: A | Discharge status: Home / Self Care | Payer: Medicare Other | Source: Ambulatory Visit | Attending: Radiation Oncology | Admitting: Radiation Oncology

## 2021-04-20 DIAGNOSIS — C61 Malignant neoplasm of prostate: Secondary | ICD-10-CM | POA: Diagnosis not present

## 2021-04-20 DIAGNOSIS — Z510 Encounter for antineoplastic radiation therapy: Secondary | ICD-10-CM | POA: Diagnosis not present

## 2021-04-20 DIAGNOSIS — C775 Secondary and unspecified malignant neoplasm of intrapelvic lymph nodes: Secondary | ICD-10-CM | POA: Diagnosis not present

## 2021-04-20 DIAGNOSIS — Z191 Hormone sensitive malignancy status: Secondary | ICD-10-CM | POA: Diagnosis not present

## 2021-04-21 ENCOUNTER — Ambulatory Visit
Admission: RE | Admit: 2021-04-21 | Discharge: 2021-04-21 | Disposition: A | Discharge status: Home / Self Care | Payer: Medicare Other | Source: Ambulatory Visit | Attending: Radiation Oncology | Admitting: Radiation Oncology

## 2021-04-21 ENCOUNTER — Telehealth: Payer: Self-pay | Admitting: Internal Medicine

## 2021-04-21 ENCOUNTER — Ambulatory Visit: Payer: Medicare Other

## 2021-04-21 DIAGNOSIS — Z510 Encounter for antineoplastic radiation therapy: Secondary | ICD-10-CM | POA: Diagnosis not present

## 2021-04-21 DIAGNOSIS — Z191 Hormone sensitive malignancy status: Secondary | ICD-10-CM | POA: Diagnosis not present

## 2021-04-21 DIAGNOSIS — C61 Malignant neoplasm of prostate: Secondary | ICD-10-CM | POA: Diagnosis not present

## 2021-04-21 DIAGNOSIS — C775 Secondary and unspecified malignant neoplasm of intrapelvic lymph nodes: Secondary | ICD-10-CM | POA: Diagnosis not present

## 2021-04-21 NOTE — Telephone Encounter (Signed)
Patients wife called and said that the patient has been taking imodium do for his diarrhea. She said that it is not working and was wondering if there was anything else that he could take. Please advise

## 2021-04-22 ENCOUNTER — Ambulatory Visit
Admission: RE | Admit: 2021-04-22 | Discharge: 2021-04-22 | Disposition: A | Discharge status: Home / Self Care | Payer: Medicare Other | Source: Ambulatory Visit | Attending: Radiation Oncology | Admitting: Radiation Oncology

## 2021-04-22 ENCOUNTER — Other Ambulatory Visit: Payer: Self-pay

## 2021-04-22 ENCOUNTER — Other Ambulatory Visit: Payer: Self-pay | Admitting: Radiation Oncology

## 2021-04-22 ENCOUNTER — Ambulatory Visit: Payer: Medicare Other

## 2021-04-22 ENCOUNTER — Encounter: Payer: Self-pay | Admitting: Urology

## 2021-04-22 DIAGNOSIS — Z191 Hormone sensitive malignancy status: Secondary | ICD-10-CM | POA: Diagnosis not present

## 2021-04-22 DIAGNOSIS — Z510 Encounter for antineoplastic radiation therapy: Secondary | ICD-10-CM | POA: Diagnosis not present

## 2021-04-22 DIAGNOSIS — C775 Secondary and unspecified malignant neoplasm of intrapelvic lymph nodes: Secondary | ICD-10-CM | POA: Diagnosis not present

## 2021-04-22 DIAGNOSIS — C61 Malignant neoplasm of prostate: Secondary | ICD-10-CM | POA: Diagnosis not present

## 2021-04-22 MED ORDER — DIPHENOXYLATE-ATROPINE 2.5-0.025 MG PO TABS: 1.0000 | ORAL_TABLET | Freq: Four times a day (QID) | ORAL | 0 refills | Status: DC | PRN

## 2021-04-22 NOTE — Telephone Encounter (Signed)
Pt notified of PCP response.  States she will call oncologist as she believes that diarrhea is d/t therapy response.

## 2021-04-22 NOTE — Patient Instructions (Addendum)
Start taking Limotol prescription today (STOP TAKING IMODIUM AD).   For gas and bloating relief you can also try:  --PeptoBismol --Simeticone --Phazyme  **Call [phone] if your symptoms don't improve by Friday 04/24/21**

## 2021-04-22 NOTE — Telephone Encounter (Signed)
Can have visit with [place] or call his oncologist they may be able to treat this without a visit.

## 2021-04-23 ENCOUNTER — Ambulatory Visit: Payer: Medicare Other

## 2021-04-23 MED ORDER — SERTRALINE HCL 50 MG PO TABS
50.0000 mg | ORAL_TABLET | Freq: Every day | ORAL | 2 refills | Status: DC
Start: 2021-04-23 — End: 2021-11-05

## 2021-04-23 NOTE — Addendum Note (Signed)
Addended by: Elza Rafter D on: 04/23/2021 03:58 PM   Modules accepted: Orders

## 2021-04-23 NOTE — Telephone Encounter (Signed)
Pt's wife states she requested the increased dosage of medication as noted in May 18 encounter.  Pt was to f/u in 3-4 weeks to determine effectiveness. Wife states that increase was helpful for that time period & feels like he can decrease to 50mg  now; states she will half medication at perforation on pill if ok.

## 2021-04-23 NOTE — Telephone Encounter (Signed)
Can you send in for the 50 mg tablets so the rx is correct? He can half pills he has at home for the 100 mg.

## 2021-04-24 ENCOUNTER — Ambulatory Visit: Payer: Medicare Other

## 2021-04-25 ENCOUNTER — Other Ambulatory Visit: Payer: Self-pay | Admitting: Internal Medicine

## 2021-04-27 ENCOUNTER — Ambulatory Visit: Payer: Medicare Other

## 2021-04-28 ENCOUNTER — Ambulatory Visit: Payer: Medicare Other

## 2021-04-29 ENCOUNTER — Ambulatory Visit: Payer: Medicare Other

## 2021-04-30 ENCOUNTER — Ambulatory Visit: Payer: Medicare Other

## 2021-04-30 ENCOUNTER — Other Ambulatory Visit: Payer: Self-pay | Admitting: Internal Medicine

## 2021-05-01 ENCOUNTER — Ambulatory Visit: Payer: Medicare Other

## 2021-05-04 ENCOUNTER — Ambulatory Visit: Payer: Medicare Other

## 2021-05-05 ENCOUNTER — Ambulatory Visit: Payer: Medicare Other

## 2021-05-06 ENCOUNTER — Ambulatory Visit: Payer: Medicare Other

## 2021-05-07 ENCOUNTER — Ambulatory Visit: Payer: Medicare Other

## 2021-05-08 ENCOUNTER — Ambulatory Visit: Payer: Medicare Other

## 2021-05-11 ENCOUNTER — Ambulatory Visit: Payer: Medicare Other

## 2021-05-12 ENCOUNTER — Ambulatory Visit: Payer: Medicare Other

## 2021-05-13 ENCOUNTER — Ambulatory Visit: Payer: Medicare Other

## 2021-05-13 ENCOUNTER — Other Ambulatory Visit: Payer: Self-pay | Admitting: Radiation Oncology

## 2021-05-13 ENCOUNTER — Other Ambulatory Visit: Payer: Medicare Other

## 2021-05-13 ENCOUNTER — Telehealth: Payer: Self-pay | Admitting: Oncology

## 2021-05-13 ENCOUNTER — Ambulatory Visit: Payer: Medicare Other | Admitting: Oncology

## 2021-05-13 NOTE — Telephone Encounter (Signed)
R/s appts per 7/6 sch msg. Called pt, no answer. Left msg with appts date and times.

## 2021-05-14 ENCOUNTER — Ambulatory Visit: Payer: Medicare Other

## 2021-05-14 ENCOUNTER — Telehealth: Payer: Self-pay | Admitting: Oncology

## 2021-05-14 ENCOUNTER — Other Ambulatory Visit: Payer: Medicare Other

## 2021-05-14 ENCOUNTER — Ambulatory Visit: Payer: Medicare Other | Admitting: Oncology

## 2021-05-14 NOTE — Telephone Encounter (Signed)
R/s appts per 7/6 sch msg. Pt's wife is aware.

## 2021-05-15 ENCOUNTER — Ambulatory Visit: Payer: Medicare Other

## 2021-05-18 ENCOUNTER — Ambulatory Visit: Payer: Medicare Other | Admitting: Oncology

## 2021-05-18 ENCOUNTER — Other Ambulatory Visit: Payer: Medicare Other

## 2021-05-18 ENCOUNTER — Ambulatory Visit: Payer: Medicare Other

## 2021-05-19 ENCOUNTER — Ambulatory Visit: Payer: Medicare Other

## 2021-05-20 ENCOUNTER — Ambulatory Visit: Payer: Medicare Other

## 2021-05-20 ENCOUNTER — Telehealth: Payer: Self-pay | Admitting: Oncology

## 2021-05-20 NOTE — Telephone Encounter (Signed)
Scheduled appointment per 07/13 sch msg. Left message. 

## 2021-05-25 ENCOUNTER — Other Ambulatory Visit: Payer: Self-pay

## 2021-05-25 ENCOUNTER — Inpatient Hospital Stay: Payer: Medicare Other | Attending: Oncology

## 2021-05-25 DIAGNOSIS — R5383 Other fatigue: Secondary | ICD-10-CM | POA: Insufficient documentation

## 2021-05-25 DIAGNOSIS — M810 Age-related osteoporosis without current pathological fracture: Secondary | ICD-10-CM | POA: Diagnosis not present

## 2021-05-25 DIAGNOSIS — R197 Diarrhea, unspecified: Secondary | ICD-10-CM | POA: Insufficient documentation

## 2021-05-25 DIAGNOSIS — C61 Malignant neoplasm of prostate: Secondary | ICD-10-CM | POA: Insufficient documentation

## 2021-05-25 DIAGNOSIS — Z7982 Long term (current) use of aspirin: Secondary | ICD-10-CM | POA: Diagnosis not present

## 2021-05-25 DIAGNOSIS — R232 Flushing: Secondary | ICD-10-CM | POA: Diagnosis not present

## 2021-05-25 DIAGNOSIS — C775 Secondary and unspecified malignant neoplasm of intrapelvic lymph nodes: Secondary | ICD-10-CM | POA: Insufficient documentation

## 2021-05-25 DIAGNOSIS — C7951 Secondary malignant neoplasm of bone: Secondary | ICD-10-CM | POA: Diagnosis not present

## 2021-05-25 DIAGNOSIS — Z79899 Other long term (current) drug therapy: Secondary | ICD-10-CM | POA: Diagnosis not present

## 2021-05-25 DIAGNOSIS — Z8546 Personal history of malignant neoplasm of prostate: Secondary | ICD-10-CM

## 2021-05-25 DIAGNOSIS — Z79818 Long term (current) use of other agents affecting estrogen receptors and estrogen levels: Secondary | ICD-10-CM | POA: Diagnosis not present

## 2021-05-25 LAB — CMP (CANCER CENTER ONLY)
ALT: 10 U/L (ref 0–44)
AST: 16 U/L (ref 15–41)
Albumin: 3.8 g/dL (ref 3.5–5.0)
Alkaline Phosphatase: 77 U/L (ref 38–126)
Anion gap: 10 (ref 5–15)
BUN: 27 mg/dL — ABNORMAL HIGH (ref 8–23)
CO2: 26 mmol/L (ref 22–32)
Calcium: 9.6 mg/dL (ref 8.9–10.3)
Chloride: 105 mmol/L (ref 98–111)
Creatinine: 1.09 mg/dL (ref 0.61–1.24)
GFR, Estimated: >60 mL/min (ref >60)
Glucose, Bld: 130 mg/dL — ABNORMAL HIGH (ref 70–99)
Potassium: 4.6 mmol/L (ref 3.5–5.1)
Sodium: 141 mmol/L (ref 135–145)
Total Bilirubin: 0.4 mg/dL (ref 0.3–1.2)
Total Protein: 6.8 g/dL (ref 6.5–8.1)

## 2021-05-25 LAB — CBC WITH DIFFERENTIAL (CANCER CENTER ONLY)
Abs Immature Granulocytes: 0.05 10*3/uL (ref 0.00–0.07)
Basophils Absolute: 0 10*3/uL (ref 0.0–0.1)
Basophils Relative: 1 %
Eosinophils Absolute: 0.3 10*3/uL (ref 0.0–0.5)
Eosinophils Relative: 6 %
HCT: 38.4 % — ABNORMAL LOW (ref 39.0–52.0)
Hemoglobin: 13.2 g/dL (ref 13.0–17.0)
Immature Granulocytes: 1 %
Lymphocytes Relative: 12 %
Lymphs Abs: 0.6 10*3/uL — ABNORMAL LOW (ref 0.7–4.0)
MCH: 32 pg (ref 26.0–34.0)
MCHC: 34.4 g/dL (ref 30.0–36.0)
MCV: 93.2 fL (ref 80.0–100.0)
Monocytes Absolute: 0.6 10*3/uL (ref 0.1–1.0)
Monocytes Relative: 11 %
Neutro Abs: 3.7 10*3/uL (ref 1.7–7.7)
Neutrophils Relative %: 69 %
Platelet Count: 143 10*3/uL — ABNORMAL LOW (ref 150–400)
RBC: 4.12 MIL/uL — ABNORMAL LOW (ref 4.22–5.81)
RDW: 14.6 % (ref 11.5–15.5)
WBC Count: 5.3 10*3/uL (ref 4.0–10.5)
nRBC: 0 % (ref 0.0–0.2)

## 2021-05-26 LAB — PROSTATE-SPECIFIC AG, SERUM (LABCORP): Prostate Specific Ag, Serum: 31.8 ng/mL — ABNORMAL HIGH (ref 0.0–4.0)

## 2021-05-27 ENCOUNTER — Other Ambulatory Visit: Payer: Medicare Other

## 2021-05-27 ENCOUNTER — Inpatient Hospital Stay: Payer: Medicare Other | Admitting: Oncology

## 2021-05-27 ENCOUNTER — Other Ambulatory Visit: Payer: Self-pay

## 2021-05-27 ENCOUNTER — Inpatient Hospital Stay: Payer: Medicare Other

## 2021-05-27 VITALS — BP 117/72 | HR 71 | Temp 98.7°F | Resp 18 | Ht 68.0 in | Wt 149.9 lb

## 2021-05-27 DIAGNOSIS — Z7982 Long term (current) use of aspirin: Secondary | ICD-10-CM | POA: Diagnosis not present

## 2021-05-27 DIAGNOSIS — C7951 Secondary malignant neoplasm of bone: Secondary | ICD-10-CM | POA: Diagnosis not present

## 2021-05-27 DIAGNOSIS — R197 Diarrhea, unspecified: Secondary | ICD-10-CM | POA: Diagnosis not present

## 2021-05-27 DIAGNOSIS — R232 Flushing: Secondary | ICD-10-CM | POA: Diagnosis not present

## 2021-05-27 DIAGNOSIS — C775 Secondary and unspecified malignant neoplasm of intrapelvic lymph nodes: Secondary | ICD-10-CM | POA: Diagnosis not present

## 2021-05-27 DIAGNOSIS — Z8546 Personal history of malignant neoplasm of prostate: Secondary | ICD-10-CM

## 2021-05-27 DIAGNOSIS — R5383 Other fatigue: Secondary | ICD-10-CM | POA: Diagnosis not present

## 2021-05-27 DIAGNOSIS — C61 Malignant neoplasm of prostate: Secondary | ICD-10-CM | POA: Diagnosis not present

## 2021-05-27 DIAGNOSIS — M810 Age-related osteoporosis without current pathological fracture: Secondary | ICD-10-CM | POA: Diagnosis not present

## 2021-05-27 DIAGNOSIS — Z79899 Other long term (current) drug therapy: Secondary | ICD-10-CM | POA: Diagnosis not present

## 2021-05-27 MED ORDER — LEUPROLIDE ACETATE (4 MONTH) 30 MG ~~LOC~~ KIT
PACK | SUBCUTANEOUS | Status: AC
  Filled 2021-05-27: qty 30

## 2021-05-27 MED ORDER — LEUPROLIDE ACETATE (4 MONTH) 30 MG ~~LOC~~ KIT
30.0000 mg | PACK | Freq: Once | SUBCUTANEOUS | Status: AC
  Administered 2021-05-27: 30 mg via SUBCUTANEOUS

## 2021-05-27 NOTE — Patient Instructions (Signed)
Leuprolide depot injection What is this medication? LEUPROLIDE (loo PROE lide) is a man-made protein that acts like a natural hormone in the body. It decreases testosterone in men and decreases estrogen in women. In men, this medicine is used to treat advanced prostate cancer. In women, some forms of this medicine may be used to treat endometriosis, uterinefibroids, or other male hormone-related problems. This medicine may be used for other purposes; ask your health care provider orpharmacist if you have questions. COMMON BRAND NAME(S): Eligard, Fensolv, Lupron Depot, Lupron Depot-Ped, Viadur What should I tell my care team before I take this medication? They need to know if you have any of these conditions: diabetes heart disease or previous heart attack high blood pressure high cholesterol mental illness osteoporosis pain or difficulty passing urine seizures spinal cord metastasis stroke suicidal thoughts, plans, or attempt; a previous suicide attempt by you or a family member tobacco smoker unusual vaginal bleeding (women) an unusual or allergic reaction to leuprolide, benzyl alcohol, other medicines, foods, dyes, or preservatives pregnant or trying to get pregnant breast-feeding How should I use this medication? This medicine is for injection into a muscle or for injection under the skin. It is given by a health care professional in a hospital or clinic setting. The specific product will determine how it will be given to you. Make sure youunderstand which product you receive and how often you will receive it. Talk to your pediatrician regarding the use of this medicine in children.Special care may be needed. Overdosage: If you think you have taken too much of this medicine contact apoison control center or emergency room at once. NOTE: This medicine is only for you. Do not share this medicine with others. What if I miss a dose? It is important not to miss a dose. Call your doctor  or health careprofessional if you are unable to keep an appointment. Depot injections: Depot injections are given either once-monthly, every 12 weeks, every 16 weeks, or every 24 weeks depending on the product you are prescribed. The product you are prescribed will be based on if you are male orfemale, and your condition. Make sure you understand your product and dosing. What may interact with this medication? Do not take this medicine with any of the following medications: chasteberry cisapride dronedarone pimozide thioridazine This medicine may also interact with the following medications: herbal or dietary supplements, like black cohosh or DHEA male hormones, like estrogens or progestins and birth control pills, patches, rings, or injections male hormones, like testosterone other medicines that prolong the QT interval (abnormal heart rhythm) This list may not describe all possible interactions. Give your health care provider a list of all the medicines, herbs, non-prescription drugs, or dietary supplements you use. Also tell them if you smoke, drink alcohol, or use illegaldrugs. Some items may interact with your medicine. What should I watch for while using this medication? Visit your doctor or health care professional for regular checks on your progress. During the first weeks of treatment, your symptoms may get worse, but then will improve as you continue your treatment. You may get hot flashes, increased bone pain, increased difficulty passing urine, or an aggravation of nerve symptoms. Discuss these effects with your doctor or health careprofessional, some of them may improve with continued use of this medicine. Male patients may experience a menstrual cycle or spotting during the first months of therapy with this medicine. If this continues, contact your doctor orhealth care professional. This medicine may increase blood sugar. Ask  your healthcare provider if changesin diet or medicines  are needed if you have diabetes. What side effects may I notice from receiving this medication? Side effects that you should report to your doctor or health care professionalas soon as possible: allergic reactions like skin rash, itching or hives, swelling of the face, lips, or tongue breathing problems chest pain depression or memory disorders pain in your legs or groin pain at site where injected or implanted seizures severe headache signs and symptoms of high blood sugar such as being more thirsty or hungry or having to urinate more than normal. You may also feel very tired or have blurry vision swelling of the feet and legs suicidal thoughts or other mood changes visual changes vomiting Side effects that usually do not require medical attention (report to yourdoctor or health care professional if they continue or are bothersome): breast swelling or tenderness decrease in sex drive or performance diarrhea hot flashes loss of appetite muscle, joint, or bone pains nausea redness or irritation at site where injected or implanted skin problems or acne This list may not describe all possible side effects. Call your doctor for medical advice about side effects. You may report side effects to FDA at1-800-FDA-1088. Where should I keep my medication? This drug is given in a hospital or clinic and will not be stored at home. NOTE: This sheet is a summary. It may not cover all possible information. If you have questions about this medicine, talk to your doctor, pharmacist, orhealth care provider.  2022 Elsevier/Gold Standard (2019-09-26 10:35:13)

## 2021-05-27 NOTE — Progress Notes (Signed)
Hematology and Oncology Follow Up   Randy DETHLOFF Sr. [id number] [birth year] 87 y.o. 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    Prior Therapy:   At that time he underwent a radical prostatectomy for a Gleason score 4+3 = 7 and a PSA of 6.8.  He subsequently developed a biochemical relapse and required androgen deprivation therapy prior 2016.  He subsequently developed castration resistant disease with a rise in his PSA up to 6.63 in July 2018.  Apalutamide was started and he has been on it since that time.  His nadir of PSA was 3.06 in February 2019.  His PSA was up to 6.29 in August 2019 and in December 2019 was up to 7.87   He is status post radiation therapy to the pelvic lymph nodes for a total of 68.3 Gray in 38 fractions as well as 50 Gray in 5 fractions to the rib.  Therapy concluded in June 2022.   Current therapy: Apalutamide 240 mg daily started July 2018.  He is under consideration for different systemic therapy.   Interim History: Mr Randy [name] returns today for a follow-up visit.  Since last visit, he completed radiation therapy with a few complaints including symptoms of diarrhea and fatigue.  Currently, he reports feeling well and improved overall.  He is no longer reporting any diarrhea, nausea or vomiting.  He denies any excessive fatigue or tiredness.  He has lost some weight however.  His performance status quality of life remained reasonable.   Medications: Without any changes on review. Current Outpatient Medications  Medication Sig Dispense Refill   ASPERCREME LIDOCAINE EX Apply 1 application topically daily as needed (pain).     aspirin 325 MG EC tablet Take 325 mg by mouth every morning.     Calcium  Carb-Cholecalciferol (CALCIUM 1000 + D PO) Take 1 tablet by mouth daily.     diphenoxylate-atropine (LOMOTIL) 2.5-0.025 MG tablet Take 1-2 tablets by mouth 4 (four) times daily as needed for up to 15 doses for diarrhea or loose stools. 30 tablet 0   ERLEADA 60 MG tablet Take 4 tablets (240 mg total) by mouth daily at 12 noon. 120 tablet 0   fish oil-omega-3 fatty acids 1000 MG capsule Take 1 g by mouth 2 (two) times daily.      fluticasone (FLONASE) 50 MCG/ACT nasal spray SPRAY 2 SPRAYS INTO EACH NOSTRIL EVERY DAY 48 mL 1   LINZESS 145 MCG CAPS capsule TAKE 1 CAPSULE (145 MCG TOTAL) BY MOUTH DAILY BEFORE BREAKFAST. 30 capsule 2   metoprolol tartrate (LOPRESSOR) 50 MG tablet TAKE 1 TABLET BY MOUTH TWICE A DAY 180 tablet 0   niacin 500 MG tablet Take 500 mg by mouth at bedtime.     nitroGLYCERIN (NITROSTAT) 0.4 MG SL tablet Place 1 tablet (0.4 mg total) under the tongue every 5 (five) minutes as needed for chest pain. 25 tablet 1   promethazine (PHENERGAN) 25 MG tablet TAKE 1 TABLET BY MOUTH EVERY 8 HOURS AS NEEDED FOR NAUSEA OR VOMITING. 20 tablet 0   sertraline (ZOLOFT) 50 MG tablet Take 1 tablet (50 mg total) by mouth daily. 90 tablet 2   simvastatin (ZOCOR) 20 MG tablet Take 1 tablet (20 mg total) by mouth daily  at 6 PM. 90 tablet 3   zolpidem (AMBIEN) 10 MG tablet Take 1 tablet (10 mg total) by mouth at bedtime. 90 tablet 1   No current facility-administered medications for this visit.     Allergies:  Allergies  Allergen Reactions   Nsaids     Had a heart attack, so can not take NSAIDS   Physical exam: Blood pressure 117/72, pulse 71, temperature 98.7 F (37.1 C), temperature source Oral, resp. rate 18, height 5\' 8"  (1.727 m), weight 149 lb 14.4 oz (68 kg), SpO2 100 %.  ECOG 1   General appearance: Comfortable appearing without any discomfort Head: Normocephalic without any trauma Oropharynx: Mucous membranes are moist and pink without any thrush or ulcers. Eyes: Pupils are equal  and round reactive to light. Lymph nodes: No cervical, supraclavicular, inguinal or axillary lymphadenopathy.   Heart:regular rate and rhythm.  S1 and S2 without leg edema. Lung: Clear without any rhonchi or wheezes.  No dullness to percussion. Abdomin: Soft, nontender, nondistended with good bowel sounds.  No hepatosplenomegaly. Musculoskeletal: No joint deformity or effusion.  Full range of motion noted. Neurological: No deficits noted on motor, sensory and deep tendon reflex exam. Skin: No petechial rash or dryness.  Appeared moist.  Psychiatric: Mood and affect appeared appropriate.    Lab Results: Lab Results  Component Value Date   WBC 5.3 05/25/2021   HGB 13.2 05/25/2021   HCT 38.4 (L) 05/25/2021   MCV 93.2 05/25/2021   PLT 143 (L) 05/25/2021     Chemistry      Component Value Date/Time   NA 141 05/25/2021 1327   K 4.6 05/25/2021 1327   CL 105 05/25/2021 1327   CO2 26 05/25/2021 1327   BUN 27 (H) 05/25/2021 1327   CREATININE 1.09 05/25/2021 1327      Component Value Date/Time   CALCIUM 9.6 05/25/2021 1327   ALKPHOS 77 05/25/2021 1327   AST 16 05/25/2021 1327   ALT 10 05/25/2021 1327   BILITOT 0.4 05/25/2021 1327      Results for Randy [name], Randy SR. (MRN [id number]) as of 05/27/2021 13:45  Ref. Range 12/15/2020 10:41 05/25/2021 13:27  Prostate Specific Ag, Serum Latest Ref Range: 0.0 - 4.0 ng/mL 29.7 (H) 31.8 (H)     Impression and Plan:  87 year old man with:   1.   Advanced prostate cancer with pelvic adenopathy diagnosed in 2018.  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  He is status post local therapy with radiation outlined above as well as treatment of an isolated rib metastasis.  His PSA has stabilized currently around 31.8 and anticipate will drop further after the effect of radiation.  Treatment options moving forward were discussed which include systemic chemotherapy as well as potentially PARP inhibitor if he harbors the appropriate  mutation.  I recommended a period of observation and update his laboratory testing in the next 6 to 8 weeks and consider treatment at that time.  He is agreeable to proceed.  2.  Androgen deprivation therapy: He will receive Eligard today and repeated in 4 months.  Complication clinic weight gain, hot flashes and osteoporosis.     3.  Prognosis and goals of care: His disease is incurable although aggressive measures are warranted given his reasonable performance status.   4.  Follow-up: In 2 months for repeat evaluation.     30  minutes were dedicated to this visit. The time was spent on reviewing laboratory data, imaging studies, discussing treatment options, and answering  questions regarding future plan.    Zola Button, MD 05/27/2021 1:44 PM

## 2021-06-02 ENCOUNTER — Encounter: Payer: Self-pay | Admitting: Urology

## 2021-06-02 NOTE — Progress Notes (Signed)
Randy [name] due to for follow up appointment after receiving radiation treatment to his prostate. Meaningful use questions complete. Randy [name] AUA score is 6. Made Randy [name] and wife aware the Ashlyn PA would be calling tomorrow at 930 am. Randy [name] and wife voiced understanding.

## 2021-06-02 NOTE — Progress Notes (Signed)
  Radiation Oncology         [phone]) [phone] ________________________________  Name: [name] MRN: [id number]  Date: 04/22/2021  DOB: Dec 28, 1929  End of Treatment Note  Diagnosis:   87 y.o. 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     Indication for treatment:  Curative, Definitive Radiotherapy       Radiation treatment dates:    03/23/21 - 04/22/21 03/23/21 - 04/01/21  Site/dose:  1. IMRT// The prostate fossa and pelvic lymph nodes treated to 60 Gy in 20 fxs of 3 Gy each. 2.  SBRT// The target in the right 5th rib was treated to 50 Gy in 5 Fxs of 10 Gy each.  Beams/energy:  1. The prostate fossa  and pelvic lymph nodes were treated using VMAT intensity modulated radiotherapy delivering 6 megavolt photons. Image guidance was performed with CB-CT studies prior to each fraction. He was immobilized with a body fix lower extremity mold.  2. The isolated 5th rib metastasis was treated using stereotactic body radiotherapy according to a 3D conformal radiotherapy plan.  Volumetric arc fields were employed to deliver 6 MV X-rays.  Image guidance was performed with per fraction cone beam CT prior to treatment under personal MD supervision.  Immobilization was achieved using BodyFix Pillow.  Narrative: The patient tolerated radiation treatment relatively well.   The patient experienced some minor urinary irritation and modest fatigue.   Plan: The patient has completed radiation treatment. He will return to radiation oncology clinic for routine followup in one month. I advised him to call or return sooner if he has any questions or concerns related to his recovery or treatment. ________________________________  Sheral Apley. Tammi Klippel, M.D.

## 2021-06-02 NOTE — Progress Notes (Signed)
Radiation Oncology         [phone]) [phone] ________________________________  Name: Randy Boston Sr. MRN: [id number]  Date: 06/03/2021  DOB: 07-Jan-1930  Post Treatment Note  CC: [name], MD  Wyatt Portela, MD  Diagnosis:   87 y.o. 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    Interval Since Last Radiation:  6 weeks  03/23/21 - 04/22/21: IMRT// The prostate fossa and pelvic lymph nodes treated to 60 Gy in 20 fxs of 3 Gy each. 03/23/21 - 04/01/21: SBRT// The target in the right 5th rib was treated to 50 Gy in 5 Fxs of 10 Gy each.  Narrative:  I spoke with the patient to conduct his routine scheduled 1 month follow up visit via telephone to spare the patient unnecessary potential exposure in the healthcare setting during the current COVID-19 pandemic.  The patient was notified in advance and gave permission to proceed with this visit format.  He tolerated radiation treatment relatively well with only minor urinary irritation and modest fatigue.  He did experience some mild, occasional diarrhea and nocturia 2-3 times per night.                           On review of systems, the patient states that he is doing well in general aside from some persistent fatigue.  He specifically denies abdominal pain, nausea, vomiting, diarrhea or constipation.  He also denies gross hematuria, dysuria, straining to void or incomplete bladder emptying.  He continues to tolerate the Eligard ADT fairly well and got his last 52-monthinjection with Dr. SAlen Blewon 05/27/2021.  The plan is to continue on the ADT alone for now and repeat imaging for disease restaging prior to his next scheduled follow-up visit in September 2022.  Overall, he is pleased with his progress to date.  ALLERGIES:  is allergic to nsaids.  Meds: Current Outpatient Medications  Medication Sig  Dispense Refill   ASPERCREME LIDOCAINE EX Apply 1 application topically daily as needed (pain).     aspirin 325 MG EC tablet Take 325 mg by mouth every morning.     Calcium Carb-Cholecalciferol (CALCIUM 1000 + D PO) Take 1 tablet by mouth daily.     diphenoxylate-atropine (LOMOTIL) 2.5-0.025 MG tablet Take 1-2 tablets by mouth 4 (four) times daily as needed for up to 15 doses for diarrhea or loose stools. 30 tablet 0   ERLEADA 60 MG tablet Take 4 tablets (240 mg total) by mouth daily at 12 noon. 120 tablet 0   fish oil-omega-3 fatty acids 1000 MG capsule Take 1 g by mouth 2 (two) times daily.      fluticasone (FLONASE) 50 MCG/ACT nasal spray SPRAY 2 SPRAYS INTO EACH NOSTRIL EVERY DAY 48 mL 1   LINZESS 145 MCG CAPS capsule TAKE 1 CAPSULE (145 MCG TOTAL) BY MOUTH DAILY BEFORE BREAKFAST. 30 capsule 2   metoprolol tartrate (LOPRESSOR) 50 MG tablet TAKE 1 TABLET BY MOUTH TWICE A DAY 180 tablet 0   niacin 500 MG tablet Take 500 mg by mouth at bedtime.     nitroGLYCERIN (NITROSTAT) 0.4 MG SL tablet Place 1 tablet (0.4 mg total) under the tongue every 5 (five) minutes as needed for chest pain. (Patient not taking: Reported on 05/27/2021) 25 tablet 1   promethazine (PHENERGAN) 25 MG tablet TAKE 1 TABLET  BY MOUTH EVERY 8 HOURS AS NEEDED FOR NAUSEA OR VOMITING. (Patient not taking: Reported on 05/27/2021) 20 tablet 0   sertraline (ZOLOFT) 50 MG tablet Take 1 tablet (50 mg total) by mouth daily. 90 tablet 2   simvastatin (ZOCOR) 20 MG tablet Take 1 tablet (20 mg total) by mouth daily at 6 PM. 90 tablet 3   zolpidem (AMBIEN) 10 MG tablet Take 1 tablet (10 mg total) by mouth at bedtime. 90 tablet 1   No current facility-administered medications for this encounter.    Physical Findings:  vitals were not taken for this visit.  Pain Assessment Pain Score: 0-No pain/10 Unable to assess due to telephone follow-up visit format.  Lab Findings: Lab Results  Component Value Date   WBC 5.3 05/25/2021   HGB 13.2  05/25/2021   HCT 38.4 (L) 05/25/2021   MCV 93.2 05/25/2021   PLT 143 (L) 05/25/2021     Radiographic Findings: No results found.  Impression/Plan: 1. 87 y.o. 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   He appears to have recovered well from the effects of his recent radiotherapy and is currently without complaints.  He will continue to follow up with Dr. Alen Blew for ongoing management of his systemic disease.  At the time of his most recent follow-up visit on 05/27/2021, the decision was to continue with Eligard every 4 months with a repeat PSA in approximately 2 months.  His most recent PSA on 05/25/2021 had stabilized at 31.8 as compared to 29.7 on 12/15/2020.  Pending his continued PSA response, he will be considered for further treatment options including systemic chemotherapy or potentially PARP inhibitor if he harbors the appropriate mutation.  He understands what to expect with regards to PSA monitoring going forward. I will look forward to following his response to treatment via correspondence with Dr. Alen Blew, and would be happy to continue to participate in his care if clinically indicated. I encouraged him to call or return to the office if he has any questions regarding his previous radiation or possible radiation side effects. He was comfortable with this plan and will follow up as needed.     Nicholos Johns, PA-C

## 2021-06-03 ENCOUNTER — Other Ambulatory Visit: Payer: Self-pay

## 2021-06-03 ENCOUNTER — Ambulatory Visit
Admission: RE | Admit: 2021-06-03 | Discharge: 2021-06-03 | Disposition: A | Discharge status: Home / Self Care | Payer: Medicare Other | Source: Ambulatory Visit | Attending: Urology | Admitting: Urology

## 2021-06-03 DIAGNOSIS — C61 Malignant neoplasm of prostate: Secondary | ICD-10-CM

## 2021-06-03 DIAGNOSIS — Z191 Hormone sensitive malignancy status: Secondary | ICD-10-CM

## 2021-06-03 DIAGNOSIS — C7951 Secondary malignant neoplasm of bone: Secondary | ICD-10-CM

## 2021-06-04 ENCOUNTER — Encounter: Payer: Self-pay | Admitting: Oncology

## 2021-06-15 ENCOUNTER — Other Ambulatory Visit: Payer: Self-pay | Admitting: Internal Medicine

## 2021-06-16 ENCOUNTER — Telehealth: Payer: Self-pay | Admitting: Internal Medicine

## 2021-06-16 NOTE — Telephone Encounter (Signed)
Spouse calling to report patient takes  1 and 1/2 tablet causing him to use supply quicker. Requesting refill for zolpidem (AMBIEN) 10 MG tablet

## 2021-06-17 NOTE — Telephone Encounter (Signed)
See below

## 2021-06-18 NOTE — Telephone Encounter (Signed)
We asked them back in June to cut back to only 1 per night and if he was unable to let us know. They have not so we cannot fill early. We can have visit to discuss alternatives for sleep if needed.

## 2021-06-18 NOTE — Telephone Encounter (Signed)
Spoke with Randy [name] and he stated that his wife never made him aware that he was suppose to back to 1 tablet at bedtime in June. I offered the patient a appointment to discuss his Ambien, but he declined. He stated that he would try something over the counter until it's time for him to renew his prescription.

## 2021-06-30 ENCOUNTER — Encounter: Payer: Self-pay | Admitting: Internal Medicine

## 2021-06-30 ENCOUNTER — Other Ambulatory Visit: Payer: Self-pay

## 2021-06-30 ENCOUNTER — Ambulatory Visit (INDEPENDENT_AMBULATORY_CARE_PROVIDER_SITE_OTHER): Payer: Medicare Other | Admitting: Internal Medicine

## 2021-06-30 VITALS — BP 124/60 | HR 54 | Temp 97.6°F | Resp 18 | Ht 68.0 in | Wt 149.4 lb

## 2021-06-30 DIAGNOSIS — F5101 Primary insomnia: Secondary | ICD-10-CM

## 2021-06-30 MED ORDER — ZOLPIDEM TARTRATE 10 MG PO TABS
10.0000 mg | ORAL_TABLET | Freq: Every day | ORAL | 1 refills | Status: DC
Start: 2021-06-30 — End: 2022-01-06

## 2021-06-30 NOTE — Progress Notes (Signed)
   Subjective:   Patient ID: Randy Boston Sr., male    DOB: [birth year], 87 y.o.   MRN: [id number]  HPI The patient is an 87 YO man coming in for sleep issues. Was taking more than 1 ambien during cancer treatment to help with extra anxiety and sleep which did help. He was supposed to go back to taking 1 at night in June but did not get this message and has run out early. He is taking his wife's ambien at times per report. Still fatigued from prostate cancer locally metastatic. Next visit oncology mid September.  Review of Systems  Constitutional:  Positive for fatigue.  HENT: Negative.    Eyes: Negative.   Respiratory:  Negative for cough, chest tightness and shortness of breath.   Cardiovascular:  Negative for chest pain, palpitations and leg swelling.  Gastrointestinal:  Negative for abdominal distention, abdominal pain, constipation, diarrhea, nausea and vomiting.  Musculoskeletal: Negative.   Skin: Negative.   Neurological: Negative.   Psychiatric/Behavioral:  Positive for sleep disturbance.    Objective:  Physical Exam Constitutional:      Appearance: He is well-developed.  HENT:     Head: Normocephalic and atraumatic.     Comments: Very hard of hearing Cardiovascular:     Rate and Rhythm: Normal rate and regular rhythm.  Pulmonary:     Effort: Pulmonary effort is normal. No respiratory distress.     Breath sounds: Normal breath sounds. No wheezing or rales.  Abdominal:     General: Bowel sounds are normal. There is no distension.     Palpations: Abdomen is soft.     Tenderness: There is no abdominal tenderness. There is no rebound.  Musculoskeletal:     Cervical back: Normal range of motion.  Skin:    General: Skin is warm and dry.  Neurological:     Mental Status: He is alert and oriented to person, place, and time.     Coordination: Coordination normal.    Vitals:   06/30/21 1509  BP: 124/60  Pulse: (!) 54  Resp: 18  Temp: 97.6 F (36.4 C)  TempSrc: Oral   SpO2: 98%  Weight: 149 lb 6.4 oz (67.8 kg)  Height: '5\' 8"'$  (1.727 m)    This visit occurred during the SARS-CoV-2 public health emergency.  Safety protocols were in place, including screening questions prior to the visit, additional usage of staff PPE, and extensive cleaning of exam room while observing appropriate contact time as indicated for disinfecting solutions.   Assessment & Plan:

## 2021-06-30 NOTE — Patient Instructions (Addendum)
We have sent in the refill of the ambien. Your wife should still have refills of the Lorrin Mais so can call the pharmacy.

## 2021-07-01 NOTE — Assessment & Plan Note (Signed)
Advised that it is not safe to take someone else's medication for him or them. Refill ambien today to pharmacy and advised he is not to take more than 1 pill daily for ambien as 10 mg is maximum recommended dose.

## 2021-07-03 NOTE — Telephone Encounter (Signed)
Called patient. LVM letting him know that Dr. Sharlet Salina sent in his refill on 06/30/2021 during his office visit.

## 2021-07-03 NOTE — Telephone Encounter (Signed)
[facility] has called stating the pt has been out of the medication for 2-3 days and they need permission to override the refill for him to get it today. If not approved pt will have to wait 1 week according to the state   Please advise   [phone]

## 2021-07-06 ENCOUNTER — Telehealth: Payer: Self-pay

## 2021-07-06 NOTE — Telephone Encounter (Signed)
Pt wife called in about the Amiben. They stated that the pharmacy does not have it.

## 2021-07-07 ENCOUNTER — Telehealth: Payer: Self-pay | Admitting: Internal Medicine

## 2021-07-07 NOTE — Telephone Encounter (Signed)
See below

## 2021-07-07 NOTE — Telephone Encounter (Signed)
[facility] [place] from [place] has been trying to get a hold of someone to figure out what is going on with a medication for a pt that has cancer, the pt had been using more that what was prescribed because he was having trouble sleeping doing to chemo. Caller states the pt is now out and they have left messages and haven't heard back.  Pharmacy states that Zolpidem is not do for refill until sometime in September. States that he is out, because he has been using more than he is suppose to. States that wife said that someone at the office told her that it was ok for him to go ahead and get the Rx that is due to be filled in September. Advised pharmacist that I do not have access to any records after hours and could not do anything about a scheduled medication after hours. Advised that I would send in a note to the office and for them to call back in the am. Pharmacist request that it be made URGENT. States that they have been trying to get resolved since Wed. States that they called the office 2 times today.

## 2021-07-07 NOTE — Telephone Encounter (Signed)
Fine to okay early fill.

## 2021-07-07 NOTE — Telephone Encounter (Signed)
Spoke with the pharmacy and they have the prescription that was sent over on 06/30/2021 but insurance will not pay for it until September 3rd.    Spoke with the patient's wife and she is aware that pharmacy has a prescription but insurance will pay for it until September 3rd. She was appreciative for the call. No other questions or concerns.

## 2021-07-07 NOTE — Telephone Encounter (Signed)
Spoke with the pharmacist and they are going to refill the medication early.

## 2021-07-07 NOTE — Telephone Encounter (Signed)
[facility]--Pharmacist    [phone]   This patient's medication is on a regulatory hold due to increased dosage, but insurance will pay   The pharmacist needs the okay from the MD to fill the script today  Please contact the pharmacist at the number above

## 2021-07-09 ENCOUNTER — Other Ambulatory Visit: Payer: Self-pay

## 2021-07-09 ENCOUNTER — Inpatient Hospital Stay: Payer: Medicare Other | Attending: Oncology

## 2021-07-09 DIAGNOSIS — C61 Malignant neoplasm of prostate: Secondary | ICD-10-CM | POA: Diagnosis not present

## 2021-07-09 DIAGNOSIS — Z8546 Personal history of malignant neoplasm of prostate: Secondary | ICD-10-CM

## 2021-07-09 LAB — CMP (CANCER CENTER ONLY)
ALT: 10 U/L (ref 0–44)
AST: 17 U/L (ref 15–41)
Albumin: 3.9 g/dL (ref 3.5–5.0)
Alkaline Phosphatase: 78 U/L (ref 38–126)
Anion gap: 11 (ref 5–15)
BUN: 19 mg/dL (ref 8–23)
CO2: 25 mmol/L (ref 22–32)
Calcium: 9.5 mg/dL (ref 8.9–10.3)
Chloride: 106 mmol/L (ref 98–111)
Creatinine: 0.99 mg/dL (ref 0.61–1.24)
GFR, Estimated: >60 mL/min (ref >60)
Glucose, Bld: 110 mg/dL — ABNORMAL HIGH (ref 70–99)
Potassium: 4.5 mmol/L (ref 3.5–5.1)
Sodium: 142 mmol/L (ref 135–145)
Total Bilirubin: 0.3 mg/dL (ref 0.3–1.2)
Total Protein: 6.5 g/dL (ref 6.5–8.1)

## 2021-07-09 LAB — CBC WITH DIFFERENTIAL (CANCER CENTER ONLY)
Abs Immature Granulocytes: 0.03 10*3/uL (ref 0.00–0.07)
Basophils Absolute: 0 10*3/uL (ref 0.0–0.1)
Basophils Relative: 1 %
Eosinophils Absolute: 0.4 10*3/uL (ref 0.0–0.5)
Eosinophils Relative: 10 %
HCT: 38.1 % — ABNORMAL LOW (ref 39.0–52.0)
Hemoglobin: 13 g/dL (ref 13.0–17.0)
Immature Granulocytes: 1 %
Lymphocytes Relative: 21 %
Lymphs Abs: 0.8 10*3/uL (ref 0.7–4.0)
MCH: 33.2 pg (ref 26.0–34.0)
MCHC: 34.1 g/dL (ref 30.0–36.0)
MCV: 97.2 fL (ref 80.0–100.0)
Monocytes Absolute: 0.4 10*3/uL (ref 0.1–1.0)
Monocytes Relative: 11 %
Neutro Abs: 2.4 10*3/uL (ref 1.7–7.7)
Neutrophils Relative %: 56 %
Platelet Count: 127 10*3/uL — ABNORMAL LOW (ref 150–400)
RBC: 3.92 MIL/uL — ABNORMAL LOW (ref 4.22–5.81)
RDW: 13 % (ref 11.5–15.5)
WBC Count: 4.1 10*3/uL (ref 4.0–10.5)
nRBC: 0 % (ref 0.0–0.2)

## 2021-07-10 ENCOUNTER — Telehealth: Payer: Self-pay

## 2021-07-10 LAB — PROSTATE-SPECIFIC AG, SERUM (LABCORP): Prostate Specific Ag, Serum: 17.8 ng/mL — ABNORMAL HIGH (ref 0.0–4.0)

## 2021-07-10 NOTE — Telephone Encounter (Signed)
Pharmacy received a verbal earlier this week.

## 2021-07-13 ENCOUNTER — Other Ambulatory Visit: Payer: Self-pay | Admitting: Internal Medicine

## 2021-07-22 ENCOUNTER — Inpatient Hospital Stay (HOSPITAL_BASED_OUTPATIENT_CLINIC_OR_DEPARTMENT_OTHER): Payer: Medicare Other | Admitting: Oncology

## 2021-07-22 DIAGNOSIS — Z8546 Personal history of malignant neoplasm of prostate: Secondary | ICD-10-CM

## 2021-07-22 MED ORDER — ERLEADA 60 MG PO TABS: 240.0000 mg | ORAL_TABLET | Freq: Every day | ORAL | 0 refills | Status: DC

## 2021-07-22 NOTE — Progress Notes (Signed)
Hematology and Oncology Follow Up for Telemedicine Visits  Randy [name] [id number] 08-10-1930 87 y.o. 07/22/2021 2:24 PM Randy [name], Randy, Real [name], *   I connected with Randy [name] on 07/22/21 at  3:30 PM EDT by telephone visit and verified that I am speaking with the correct person using two identifiers.   I discussed the limitations, risks, security and privacy concerns of performing an evaluation and management service by telemedicine and the availability of in-person appointments. I also discussed with the patient that there may be a patient responsible charge related to this service. The patient expressed understanding and agreed to proceed.  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       Prior Therapy:   At that time he underwent a radical prostatectomy for a Gleason score 4+3 = 7 and a PSA of 6.8.  He subsequently developed a biochemical relapse and required androgen deprivation therapy prior 2016.  He subsequently developed castration resistant disease with a rise in his PSA up to 6.63 in July 2018.  Apalutamide was started and he has been on it since that time.  His nadir of PSA was 3.06 in February 2019.  His PSA was up to 6.29 in August 2019 and in December 2019 was up to 7.87  He is status post radiation therapy to the pelvic lymph nodes for a total of 68.3 Gray in 38 fractions as well as 50 Gray in 5 fractions to the rib.  Therapy concluded in June 2022.    Current therapy: Apalutamide 240 mg daily started July 2018.   Interim History: Mr Randy [name] denies any major changes in his health.  He continues to recover from the effects of  radiation it was concluded in June 2022 although he has reported decrease in his fatigue.  He denies any chest pain shortness of breath or dyspnea on exertion.   Medications: Updated on review. Current Outpatient Medications  Medication Sig Dispense Refill   ASPERCREME LIDOCAINE EX Apply 1 application topically daily as needed (pain).     aspirin 325 MG EC tablet Take 325 mg by mouth every morning.     Calcium Carb-Cholecalciferol (CALCIUM 1000 + D PO) Take 1 tablet by mouth daily.     diphenoxylate-atropine (LOMOTIL) 2.5-0.025 MG tablet PLEASE SEE ATTACHED FOR DETAILED DIRECTIONS 30 tablet 0   ERLEADA 60 MG tablet Take 4 tablets (240 mg total) by mouth daily at 12 noon. 120 tablet 0   fish oil-omega-3 fatty acids 1000 MG capsule Take 1 g by mouth 2 (two) times daily.      fluticasone (FLONASE) 50 MCG/ACT nasal spray SPRAY 2 SPRAYS INTO EACH NOSTRIL EVERY DAY 48 mL 1   LINZESS 145 MCG CAPS capsule TAKE 1 CAPSULE (145 MCG TOTAL) BY MOUTH DAILY BEFORE BREAKFAST. 30 capsule 2   metoprolol tartrate (LOPRESSOR) 50 MG tablet TAKE 1 TABLET BY MOUTH TWICE A DAY 180 tablet 0   niacin 500 MG tablet Take 500 mg by mouth at bedtime.     nitroGLYCERIN (NITROSTAT) 0.4 MG SL tablet Place 1 tablet (0.4 mg total) under the tongue every 5 (five) minutes as needed for chest pain. (Patient not taking: No sig  reported) 25 tablet 1   promethazine (PHENERGAN) 25 MG tablet TAKE 1 TABLET BY MOUTH EVERY 8 HOURS AS NEEDED FOR NAUSEA OR VOMITING. 20 tablet 0   sertraline (ZOLOFT) 50 MG tablet Take 1 tablet (50 mg total) by mouth daily. 90 tablet 2   simvastatin (ZOCOR) 20 MG tablet Take 1 tablet (20 mg total) by mouth daily at 6 PM. 90 tablet 3   zolpidem (AMBIEN) 10 MG tablet Take 1 tablet (10 mg total) by mouth at bedtime. 90 tablet 1   No current facility-administered medications for this visit.     Allergies:  Allergies  Allergen Reactions   Nsaids     Had a heart attack, so can not take NSAIDS       Lab  Results: Lab Results  Component Value Date   WBC 4.1 07/09/2021   HGB 13.0 07/09/2021   HCT 38.1 (L) 07/09/2021   MCV 97.2 07/09/2021   PLT 127 (L) 07/09/2021     Chemistry      Component Value Date/Time   NA 142 07/09/2021 0943   K 4.5 07/09/2021 0943   CL 106 07/09/2021 0943   CO2 25 07/09/2021 0943   BUN 19 07/09/2021 0943   CREATININE 0.99 07/09/2021 0943      Component Value Date/Time   CALCIUM 9.5 07/09/2021 0943   ALKPHOS 78 07/09/2021 0943   AST 17 07/09/2021 0943   ALT 10 07/09/2021 0943   BILITOT 0.3 07/09/2021 0943      Results for Randy [name], Randy SR. (MRN [id number]) as of 07/22/2021 14:05  Ref. Range 05/25/2021 13:27 07/09/2021 09:43  Prostate Specific Ag, Serum Latest Ref Range: 0.0 - 4.0 ng/mL 31.8 (H) 17.8 (H)    Impression and Plan:  87 year old man with:   1.   Advanced prostate cancer diagnosed in 2018.  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  Treatment options were reviewed at this time as well as his clinical status were reviewed.  His PSA on September 1 showed an excellent drop in response to his previous radiation treatments.  Different salvage therapy options including PARP inhibitor and Taxotere chemotherapy would be an option if his PSA starts to rise.  He is agreeable to continue with apalutamide for the time being.  We will obtain guardant 360 analysis before the next visit.  2.  Androgen deprivation therapy: He is on Eligard and will be given in November with his next visit.     3.  Prognosis and goals of care: His disease is incurable although aggressive measures are warranted.   4.  Follow-up: In 2 months for repeat follow-up.     I discussed the assessment and treatment plan with the patient. The patient was provided an opportunity to ask questions and all were answered. The patient agreed with the plan and demonstrated an understanding of the instructions.   The patient was  advised to call back or seek an in-person evaluation if the symptoms worsen or if the condition fails to improve as anticipated.  I provided 20 minutes of non face-to-face telephone visit time during this encounter.  This was dedicated to reviewing his laboratory data, treatment choices and future plan of care discussion.   Zola Button, MD 07/22/2021 2:24 PM

## 2021-07-22 NOTE — Addendum Note (Signed)
Addended by: Wyatt Portela on: 07/22/2021 03:27 PM   Modules accepted: Orders

## 2021-07-27 ENCOUNTER — Encounter: Payer: Self-pay | Admitting: Oncology

## 2021-07-27 ENCOUNTER — Other Ambulatory Visit (HOSPITAL_COMMUNITY): Payer: Self-pay

## 2021-07-27 ENCOUNTER — Telehealth: Payer: Self-pay

## 2021-07-27 ENCOUNTER — Telehealth: Payer: Self-pay | Admitting: *Deleted

## 2021-07-27 NOTE — Telephone Encounter (Signed)
Patient called about his restart of Erleada 60 mg.  He states he was on it previously but stopped taking it for some time.  Dr Alen Blew restarted the medication and placed an order on 07/22/2021.    Routing to oral chemotherapy to see if they need to review the medication for prior authorization before it gets filled.  Its been several days and the patient states he hasn't heard from the pharmacy about the prescription.    Routed to [place] to see if she needs to do something before the pharmacy ships the medication out.

## 2021-07-27 NOTE — Telephone Encounter (Signed)
Oral Oncology Pharmacist Encounter  Received notification patient will be restarting Erleada (apalutamide) for the treatment of castration-resistant prostate cancer in conjunction with androgen deprivation therapy, planned duration until disease progression or unacceptable toxicity.  Prescription dose and frequency assessed, no interventions needed.   Labs from 07/09/2021 assessed, no interventions needed.  Current medication list in Epic reviewed, DDIs with sertraline, simvastatin, and zolpidem identified: [place] is a strong CYP3A4 inducer and can decrease the concentration of the medications above. No interventions needed. Patient has been on Erleada previously for years and is restarting medication at this time.   Evaluated chart and no patient barriers to medication adherence noted.   Patient agreement for treatment documented in MD note on 07/22/2021.  Prescription has been e-scribed to the [facility] for benefits analysis and approval.  Oral Oncology Clinic will continue to follow for insurance authorization, copayment issues, initial counseling and start date.  Drema Halon, PharmD Hematology/Oncology Clinical Pharmacist [facility] [phone]

## 2021-07-28 ENCOUNTER — Other Ambulatory Visit: Payer: Self-pay | Admitting: *Deleted

## 2021-07-28 DIAGNOSIS — Z8546 Personal history of malignant neoplasm of prostate: Secondary | ICD-10-CM

## 2021-07-28 MED ORDER — ERLEADA 60 MG PO TABS: 240.0000 mg | ORAL_TABLET | Freq: Every day | ORAL | 0 refills | Status: DC

## 2021-07-28 NOTE — Telephone Encounter (Signed)
Oral Chemotherapy Pharmacist Encounter  I spoke with patient for overview of: Erleada (apalutamide) for the treatment of non-metastatic, castration-resistant prostate cancer, planned duration until disease progression or unacceptable toxicity.   Counseled patient on administration, dosing, side effects, monitoring, drug-food interactions, safe handling, storage, and disposal.  Patient will take Erleada 60mg  tablets, 4 tablets (240mg ) by mouth once daily without regard to food.  Erleada start date: Patient will start medication once patient receives the shipment from [facility] I reached out to the pharmacy who stated they will call them today to set up shipment but patient has not received a phone call yet.   Adverse effects include but are not limited to: rash, peripheral edema, GI upset, hypertension, hot flashes, fatigue, and arthralgias.    Reviewed with patient importance of keeping a medication schedule and plan for any missed doses. No barriers to medication adherence identified.  Medication reconciliation performed and medication/allergy list updated.  Patient already has Ship broker for [employer] as patient is restarting medication.   Patient informed to continue to reach out 5-7 days prior to needing next fill of Erleada to coordinate continued medication acquisition to prevent break in therapy.  All questions answered.  Randy [name] voiced understanding and appreciation.   Medication education handout placed in mail for patient. Patient knows to call the office with questions or concerns. Oral Chemotherapy Clinic phone number provided to patient.   Drema Halon, PharmD Hematology/Oncology Clinical Pharmacist [facility] [phone]

## 2021-07-28 NOTE — Progress Notes (Signed)
Reprinted Rx for [facility] and faxed to [facility] [phone].

## 2021-08-01 ENCOUNTER — Other Ambulatory Visit: Payer: Self-pay | Admitting: Internal Medicine

## 2021-08-04 ENCOUNTER — Other Ambulatory Visit: Payer: Self-pay | Admitting: Internal Medicine

## 2021-08-20 ENCOUNTER — Other Ambulatory Visit: Payer: Self-pay | Admitting: *Deleted

## 2021-08-20 DIAGNOSIS — Z8546 Personal history of malignant neoplasm of prostate: Secondary | ICD-10-CM

## 2021-08-20 MED ORDER — ERLEADA 60 MG PO TABS: 240.0000 mg | ORAL_TABLET | Freq: Every day | ORAL | 0 refills | Status: DC

## 2021-08-20 NOTE — Telephone Encounter (Signed)
Received fax from [facility] requesting refill for Erleada 60 mg. Refill sent electronically based on Dr. Alen Blew OV note 07/22/21

## 2021-09-09 ENCOUNTER — Other Ambulatory Visit: Payer: Self-pay | Admitting: *Deleted

## 2021-09-09 DIAGNOSIS — Z8546 Personal history of malignant neoplasm of prostate: Secondary | ICD-10-CM

## 2021-09-09 MED ORDER — ERLEADA 60 MG PO TABS: 240.0000 mg | ORAL_TABLET | Freq: Every day | ORAL | 0 refills | Status: DC

## 2021-09-15 ENCOUNTER — Inpatient Hospital Stay: Payer: Medicare Other

## 2021-09-15 ENCOUNTER — Telehealth: Payer: Self-pay | Admitting: Oncology

## 2021-09-15 NOTE — Telephone Encounter (Signed)
Scheduled per sch msg. Missed  appt today. Called, not able to leave msg. Will try again.

## 2021-09-17 ENCOUNTER — Encounter: Payer: Self-pay | Admitting: Pharmacist

## 2021-09-17 NOTE — Progress Notes (Signed)
Oral Oncology Pharmacist Encounter   Was successful in securing patient a $4250 grant from [employer] to provide copayment coverage for Erleada.  This will keep the out of pocket expense at $0.     The billing information is as follows and has been shared with [employer].   Member ID: [id number] Group ID: [id number] RxBin: [id number] PCN: [id number] Dates of Eligibility: 09/16/2021 through 09/16/2022  Fund name: [employer].  Leron Croak, PharmD, BCPS Hematology/Oncology Clinical Pharmacist [facility] [place] [phone] 09/17/2021 9:52 AM

## 2021-09-18 ENCOUNTER — Inpatient Hospital Stay: Payer: Medicare Other | Attending: Oncology

## 2021-09-18 ENCOUNTER — Other Ambulatory Visit: Payer: Self-pay

## 2021-09-18 ENCOUNTER — Inpatient Hospital Stay: Payer: Medicare Other

## 2021-09-18 DIAGNOSIS — Z8546 Personal history of malignant neoplasm of prostate: Secondary | ICD-10-CM

## 2021-09-18 DIAGNOSIS — C61 Malignant neoplasm of prostate: Secondary | ICD-10-CM | POA: Diagnosis not present

## 2021-09-18 LAB — CBC WITH DIFFERENTIAL (CANCER CENTER ONLY)
Abs Immature Granulocytes: 0.04 10*3/uL (ref 0.00–0.07)
Basophils Absolute: 0 10*3/uL (ref 0.0–0.1)
Basophils Relative: 1 %
Eosinophils Absolute: 0.3 10*3/uL (ref 0.0–0.5)
Eosinophils Relative: 7 %
HCT: 38.6 % — ABNORMAL LOW (ref 39.0–52.0)
Hemoglobin: 13.1 g/dL (ref 13.0–17.0)
Immature Granulocytes: 1 %
Lymphocytes Relative: 15 %
Lymphs Abs: 0.6 10*3/uL — ABNORMAL LOW (ref 0.7–4.0)
MCH: 31.6 pg (ref 26.0–34.0)
MCHC: 33.9 g/dL (ref 30.0–36.0)
MCV: 93 fL (ref 80.0–100.0)
Monocytes Absolute: 0.4 10*3/uL (ref 0.1–1.0)
Monocytes Relative: 9 %
Neutro Abs: 2.9 10*3/uL (ref 1.7–7.7)
Neutrophils Relative %: 67 %
Platelet Count: 160 10*3/uL (ref 150–400)
RBC: 4.15 MIL/uL — ABNORMAL LOW (ref 4.22–5.81)
RDW: 12.7 % (ref 11.5–15.5)
WBC Count: 4.4 10*3/uL (ref 4.0–10.5)
nRBC: 0 % (ref 0.0–0.2)

## 2021-09-18 LAB — CMP (CANCER CENTER ONLY)
ALT: 8 U/L (ref 0–44)
AST: 16 U/L (ref 15–41)
Albumin: 3.9 g/dL (ref 3.5–5.0)
Alkaline Phosphatase: 76 U/L (ref 38–126)
Anion gap: 9 (ref 5–15)
BUN: 25 mg/dL — ABNORMAL HIGH (ref 8–23)
CO2: 26 mmol/L (ref 22–32)
Calcium: 9.3 mg/dL (ref 8.9–10.3)
Chloride: 106 mmol/L (ref 98–111)
Creatinine: 1.15 mg/dL (ref 0.61–1.24)
GFR, Estimated: >60 mL/min (ref >60)
Glucose, Bld: 89 mg/dL (ref 70–99)
Potassium: 4.3 mmol/L (ref 3.5–5.1)
Sodium: 141 mmol/L (ref 135–145)
Total Bilirubin: 0.4 mg/dL (ref 0.3–1.2)
Total Protein: 6.8 g/dL (ref 6.5–8.1)

## 2021-09-19 LAB — PROSTATE-SPECIFIC AG, SERUM (LABCORP): Prostate Specific Ag, Serum: 12 ng/mL — ABNORMAL HIGH (ref 0.0–4.0)

## 2021-09-21 ENCOUNTER — Other Ambulatory Visit (HOSPITAL_COMMUNITY): Payer: Self-pay

## 2021-09-21 ENCOUNTER — Encounter: Payer: Self-pay | Admitting: Oncology

## 2021-09-25 ENCOUNTER — Telehealth: Payer: Self-pay | Admitting: Oncology

## 2021-09-25 ENCOUNTER — Inpatient Hospital Stay: Payer: Medicare Other

## 2021-09-25 ENCOUNTER — Inpatient Hospital Stay: Payer: Medicare Other | Admitting: Oncology

## 2021-09-25 NOTE — Telephone Encounter (Signed)
Scheduled per sch msg. Called and spoke with patients wife. Confirmed new date and time

## 2021-09-29 ENCOUNTER — Other Ambulatory Visit (HOSPITAL_COMMUNITY): Payer: Self-pay

## 2021-10-05 ENCOUNTER — Other Ambulatory Visit: Payer: Self-pay | Admitting: *Deleted

## 2021-10-05 DIAGNOSIS — Z8546 Personal history of malignant neoplasm of prostate: Secondary | ICD-10-CM

## 2021-10-05 MED ORDER — ERLEADA 60 MG PO TABS: 240.0000 mg | ORAL_TABLET | Freq: Every day | ORAL | 1 refills | Status: DC

## 2021-10-06 ENCOUNTER — Other Ambulatory Visit: Payer: Self-pay | Admitting: Internal Medicine

## 2021-10-09 ENCOUNTER — Inpatient Hospital Stay: Payer: Medicare Other | Attending: Oncology | Admitting: Oncology

## 2021-10-09 ENCOUNTER — Other Ambulatory Visit: Payer: Self-pay

## 2021-10-09 ENCOUNTER — Inpatient Hospital Stay: Payer: Medicare Other

## 2021-10-09 VITALS — BP 115/63 | HR 68 | Temp 98.1°F | Resp 17 | Ht 68.0 in | Wt 150.3 lb

## 2021-10-09 DIAGNOSIS — Z8546 Personal history of malignant neoplasm of prostate: Secondary | ICD-10-CM

## 2021-10-09 DIAGNOSIS — R599 Enlarged lymph nodes, unspecified: Secondary | ICD-10-CM | POA: Diagnosis not present

## 2021-10-09 DIAGNOSIS — C61 Malignant neoplasm of prostate: Secondary | ICD-10-CM | POA: Insufficient documentation

## 2021-10-09 MED ORDER — LEUPROLIDE ACETATE (4 MONTH) 30 MG ~~LOC~~ KIT
30.0000 mg | PACK | Freq: Once | SUBCUTANEOUS | Status: AC
  Administered 2021-10-09: 30 mg via SUBCUTANEOUS

## 2021-10-09 NOTE — Progress Notes (Signed)
Hematology and Oncology Follow Up  Randy EWAN Sr. [id number] [birth year] 87 y.o. 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  He presented in 1991 with Gleason score 4+3 = 7 and a PSA of 6.8 in 1991.       Prior Therapy:   At that time he underwent a radical prostatectomy for a Gleason score 4+3 = 7 and a PSA of 6.8.  He subsequently developed a biochemical relapse and required androgen deprivation therapy prior 2016.  He subsequently developed castration resistant disease with a rise in his PSA up to 6.63 in July 2018.  Apalutamide was started and he has been on it since that time.  His nadir of PSA was 3.06 in February 2019.  His PSA was up to 6.29 in August 2019 and in December 2019 was up to 7.87  He is status post radiation therapy to the pelvic lymph nodes for a total of 68.3 Gray in 38 fractions as well as 50 Gray in 5 fractions to the rib.  Therapy concluded in June 2022.    Current therapy: Apalutamide 240 mg daily started July 2018.   Interim History: Randy [name] returns today for a follow-up visit.  Since the last visit, he reports no major changes in his health.  He has reported some mild fatigue and tiredness but no recent hospitalization or illnesses.  He denies any bone pain or pathological fractures.   Medications: Reviewed without changes. Current Outpatient Medications  Medication Sig Dispense Refill   ASPERCREME LIDOCAINE EX Apply 1 application topically daily as needed (pain).     aspirin 325 MG EC tablet Take 325 mg by mouth every morning.     Calcium Carb-Cholecalciferol (CALCIUM 1000 + D PO) Take 1 tablet by mouth daily.     diphenoxylate-atropine (LOMOTIL) 2.5-0.025 MG tablet PLEASE SEE ATTACHED FOR DETAILED DIRECTIONS 30 tablet 0   ERLEADA 60 MG tablet Take 4 tablets (240 mg total) by  mouth daily at 12 noon. 120 tablet 1   fish oil-omega-3 fatty acids 1000 MG capsule Take 1 g by mouth 2 (two) times daily.      fluticasone (FLONASE) 50 MCG/ACT nasal spray SPRAY 2 SPRAYS INTO EACH NOSTRIL EVERY DAY 48 mL 1   LINZESS 145 MCG CAPS capsule TAKE 1 CAPSULE (145 MCG TOTAL) BY MOUTH DAILY BEFORE BREAKFAST. 30 capsule 2   metoprolol tartrate (LOPRESSOR) 50 MG tablet TAKE 1 TABLET BY MOUTH TWICE A DAY 180 tablet 0   niacin 500 MG tablet Take 500 mg by mouth at bedtime.     nitroGLYCERIN (NITROSTAT) 0.4 MG SL tablet Place 1 tablet (0.4 mg total) under the tongue every 5 (five) minutes as needed for chest pain. (Patient not taking: No sig reported) 25 tablet 1   promethazine (PHENERGAN) 25 MG tablet TAKE 1 TABLET BY MOUTH EVERY 8 HOURS AS NEEDED FOR NAUSEA AND VOMITING 20 tablet 0   sertraline (ZOLOFT) 50 MG tablet Take 1 tablet (50 mg total) by mouth daily. 90 tablet 2   simvastatin (ZOCOR) 20 MG tablet Take 1 tablet (20 mg total) by mouth daily at 6 PM. 90 tablet 3   zolpidem (AMBIEN) 10 MG tablet Take 1 tablet (10 mg total) by mouth at bedtime. 90 tablet 1   No current facility-administered medications for this visit.     Allergies:  Allergies  Allergen Reactions   Nsaids     Had a heart attack, so can not take NSAIDS   Physical exam Blood pressure 115/63, pulse 68, temperature 98.1 F (36.7 C), temperature source Temporal, resp. rate 17, height 5\' 8"  (1.727 m), weight 150 lb 4.8 oz (68.2 kg), SpO2 96 %. ECOG 1 General appearance: Comfortable appearing without any discomfort Head: Normocephalic without any trauma Oropharynx: Mucous membranes are moist and pink without any thrush or ulcers. Eyes: Pupils are equal and round reactive to light. Lymph nodes: No cervical, supraclavicular, inguinal or axillary lymphadenopathy.   Heart:regular rate and rhythm.  S1 and S2 without leg edema. Lung: Clear without any rhonchi or wheezes.  No dullness to percussion. Abdomin: Soft,  nontender, nondistended with good bowel sounds.  No hepatosplenomegaly. Musculoskeletal: No joint deformity or effusion.  Full range of motion noted. Neurological: No deficits noted on motor, sensory and deep tendon reflex exam. Skin: No petechial rash or dryness.  Appeared moist.      Lab Results: Lab Results  Component Value Date   WBC 4.4 09/18/2021   HGB 13.1 09/18/2021   HCT 38.6 (L) 09/18/2021   MCV 93.0 09/18/2021   PLT 160 09/18/2021     Chemistry      Component Value Date/Time   NA 141 09/18/2021 1356   K 4.3 09/18/2021 1356   CL 106 09/18/2021 1356   CO2 26 09/18/2021 1356   BUN 25 (H) 09/18/2021 1356   CREATININE 1.15 09/18/2021 1356      Component Value Date/Time   CALCIUM 9.3 09/18/2021 1356   ALKPHOS 76 09/18/2021 1356   AST 16 09/18/2021 1356   ALT 8 09/18/2021 1356   BILITOT 0.4 09/18/2021 1356       Latest Reference Range & Units 05/25/21 13:27 07/09/21 09:43 09/18/21 13:56  Prostate Specific Ag, Serum 0.0 - 4.0 ng/mL 31.8 (H) 17.8 (H) 12.0 (H)  (H): Data is abnormally high   Impression and Plan:  87 year old man with:   1.   Castration-resistant advanced prostate cancer diagnosed in 2018.    Laboratory data from September 18, 2021 were personally reviewed and discussed today.  His PSA continues to show reasonable decline without any evidence of disease progression.  Risks and benefits of continuing apalutamide were discussed at this time.  Different treatment options including a PARP inhibitor if he harbors the appropriate mutation as well as chemotherapy would be considered.  2.  Androgen deprivation therapy: He will receive Eligard today and repeated in 2 months.     3.  Prognosis and goals of care: Therapy remains palliative although aggressive measures are warranted at this time.   4.  Follow-up: In 2 months for repeat follow-up.     30  minutes were dedicated to this visit. The time was spent on reviewing laboratory data,discussing  treatment options, reviewing prognosis and future plan of treatment.   Zola Button, MD 10/09/2021 1:22 PM

## 2021-10-15 LAB — GUARDANT 360

## 2021-10-19 ENCOUNTER — Other Ambulatory Visit: Payer: Self-pay | Admitting: Internal Medicine

## 2021-10-19 MED ORDER — DIPHENOXYLATE-ATROPINE 2.5-0.025 MG PO TABS: ORAL_TABLET | ORAL | 0 refills | Status: DC

## 2021-10-21 ENCOUNTER — Other Ambulatory Visit: Payer: Self-pay | Admitting: Internal Medicine

## 2021-11-05 ENCOUNTER — Ambulatory Visit (INDEPENDENT_AMBULATORY_CARE_PROVIDER_SITE_OTHER): Payer: Medicare Other | Admitting: Adult Health

## 2021-11-05 ENCOUNTER — Encounter: Payer: Self-pay | Admitting: Adult Health

## 2021-11-05 VITALS — BP 120/62 | HR 82 | Temp 98.0°F | Ht 68.0 in | Wt 155.0 lb

## 2021-11-05 DIAGNOSIS — R600 Localized edema: Secondary | ICD-10-CM | POA: Diagnosis not present

## 2021-11-05 MED ORDER — FUROSEMIDE 20 MG PO TABS
20.0000 mg | ORAL_TABLET | Freq: Every day | ORAL | 0 refills | Status: DC
Start: 2021-11-05 — End: 2023-09-20

## 2021-11-05 NOTE — Patient Instructions (Signed)
It was great meeting you today   We will follow up with you regarding your lab work   I have sent in a water pill called Lasix, take this daily as needed to get the fluid off of your legs.

## 2021-11-05 NOTE — Progress Notes (Signed)
Subjective:    Patient ID: Randy Boston Sr., male    DOB: 10/14/1930, 87 y.o.   MRN: [id number]  HPI 87 year old male who  has a past medical history of Acute MI ([place]), Arthritis, Coronary artery disease, GERD (gastroesophageal reflux disease), Hyperlipidemia, Hypertension, Prostate cancer ([place]), and Wears glasses.  He is a patient of Dr. Sharlet Salina, whom I am seeing today for an acute issue. He reports that over the last three days he has had bilateral feet and legs . He does not notice any improvement in the edema in the morning. Tries to stay hydrated and does not add sodium to his diet.   Denies chest pain, shortness or breath, calf pain or redness.   No new medications or supplements    Review of Systems See HPI   Past Medical History:  Diagnosis Date   Acute MI ([place])    Arthritis    Coronary artery disease    GERD (gastroesophageal reflux disease)    Hyperlipidemia    Hypertension    Prostate cancer ([place])    Wears glasses     Social History   Socioeconomic History   Marital status: Married    Spouse name: Jeanett Schlein   Number of children: 2   Years of education: Not on file   Highest education level: Not on file  Occupational History    Comment: retired  Tobacco Use   Smoking status: Never   Smokeless tobacco: Never  Vaping Use   Vaping Use: Never used  Substance and Sexual Activity   Alcohol use: No   Drug use: No   Sexual activity: Not Currently  Other Topics Concern   Not on file  Social History Narrative   Not on file   Social Determinants of Health   Financial Resource Strain: Not on file  Food Insecurity: Not on file  Transportation Needs: Not on file  Physical Activity: Not on file  Stress: Not on file  Social Connections: Not on file  Intimate Partner Violence: Not on file    Past Surgical History:  Procedure Laterality Date   CORONARY ANGIOPLASTY     11/30/94 (Dr. Daneen Schick): Mild ant/anteroapical hypokinesis (known ant MI '94), EF  60%. 50-60%pLAD, 60% RI, 50% oRCA, 90% OM1 (s/p PTCA '96)   KNEE ARTHROSCOPY Right 03/01/2018   Procedure: ARTHROSCOPY KNEE;  Surgeon: Frederik Pear, MD;  Location: [facility];  Service: Orthopedics;  Laterality: Right;  debridement of medial and lateral meniscal tears    Family History  Problem Relation Age of Onset   Bone cancer Father    Breast cancer Neg Hx    Colon cancer Neg Hx    Prostate cancer Neg Hx    Pancreatic cancer Neg Hx     Allergies  Allergen Reactions   Nsaids     Had a heart attack, so can not take NSAIDS    Current Outpatient Medications on File Prior to Visit  Medication Sig Dispense Refill   ASPERCREME LIDOCAINE EX Apply 1 application topically daily as needed (pain).     aspirin 325 MG EC tablet Take 325 mg by mouth every morning.     Calcium Carb-Cholecalciferol (CALCIUM 1000 + D PO) Take 1 tablet by mouth daily.     ERLEADA 60 MG tablet Take 4 tablets (240 mg total) by mouth daily at 12 noon. 120 tablet 1   fish oil-omega-3 fatty acids 1000 MG capsule Take 1 g by mouth 2 (two) times daily.  fluticasone (FLONASE) 50 MCG/ACT nasal spray SPRAY 2 SPRAYS INTO EACH NOSTRIL EVERY DAY 48 mL 1   LINZESS 145 MCG CAPS capsule TAKE 1 CAPSULE (145 MCG TOTAL) BY MOUTH DAILY BEFORE BREAKFAST. 30 capsule 2   metoprolol tartrate (LOPRESSOR) 50 MG tablet TAKE 1 TABLET BY MOUTH TWICE A DAY 180 tablet 0   niacin 500 MG tablet Take 500 mg by mouth at bedtime.     nitroGLYCERIN (NITROSTAT) 0.4 MG SL tablet Place 1 tablet (0.4 mg total) under the tongue every 5 (five) minutes as needed for chest pain. 25 tablet 1   promethazine (PHENERGAN) 25 MG tablet TAKE 1 TABLET BY MOUTH EVERY 8 HOURS AS NEEDED FOR NAUSEA AND VOMITING 20 tablet 0   simvastatin (ZOCOR) 20 MG tablet Take 1 tablet (20 mg total) by mouth daily at 6 PM. 90 tablet 3   zolpidem (AMBIEN) 10 MG tablet Take 1 tablet (10 mg total) by mouth at bedtime. 90 tablet 1   diphenoxylate-atropine (LOMOTIL) 2.5-0.025 MG tablet  PLEASE SEE ATTACHED FOR DETAILED DIRECTIONS (Patient not taking: Reported on 11/05/2021) 30 tablet 0   sertraline (ZOLOFT) 100 MG tablet Take 100 mg by mouth daily.     No current facility-administered medications on file prior to visit.    BP 120/62    Pulse 82    Temp 98 F (36.7 C) (Oral)    Ht 5\' 8"  (1.727 m)    Wt 155 lb (70.3 kg)    SpO2 99%    BMI 23.57 kg/m       Objective:   Physical Exam Vitals and nursing note reviewed.  Constitutional:      Appearance: Normal appearance.  Cardiovascular:     Rate and Rhythm: Normal rate and regular rhythm.     Pulses: Normal pulses.     Heart sounds: Normal heart sounds.     Comments: + 2 pitting edema bilaterally from feet to mid shin Pulmonary:     Effort: Pulmonary effort is normal.     Breath sounds: Normal breath sounds.  Musculoskeletal:     Right lower leg: 2+ Pitting Edema present.     Left lower leg: 2+ Pitting Edema present.  Skin:    General: Skin is warm and dry.     Capillary Refill: Capillary refill takes less than 2 seconds.  Neurological:     General: No focal deficit present.     Mental Status: He is alert and oriented to person, place, and time.          Assessment & Plan:  1. Lower extremity edema - No concern for DVT at this point. Will check labs and give short course of lasix.  - Follow up with PCP  - elevate legs at night  - Stay hydrated - Low sodium diet  - Brain Natriuretic Peptide; Future - Urinalysis; Future - furosemide (LASIX) 20 MG tablet; Take 1 tablet (20 mg total) by mouth daily.  Dispense: 5 tablet; Refill: 0 - Comprehensive metabolic panel; Future - TSH; Future  Dorothyann Peng, NP

## 2021-11-06 LAB — COMPREHENSIVE METABOLIC PANEL
ALT: 8 U/L (ref 0–53)
AST: 19 U/L (ref 0–37)
Albumin: 4 g/dL (ref 3.5–5.2)
Alkaline Phosphatase: 53 U/L (ref 39–117)
BUN: 19 mg/dL (ref 6–23)
CO2: 25 mEq/L (ref 19–32)
Calcium: 8.9 mg/dL (ref 8.4–10.5)
Chloride: 107 mEq/L (ref 96–112)
Creatinine, Ser: 1.17 mg/dL (ref 0.40–1.50)
GFR: 55.2 mL/min — ABNORMAL LOW (ref >60.00)
Glucose, Bld: 114 mg/dL — ABNORMAL HIGH (ref 70–99)
Potassium: 4.7 mEq/L (ref 3.5–5.1)
Sodium: 140 mEq/L (ref 135–145)
Total Bilirubin: 0.4 mg/dL (ref 0.2–1.2)
Total Protein: 6.2 g/dL (ref 6.0–8.3)

## 2021-11-06 LAB — URINALYSIS
Bilirubin Urine: NEGATIVE
Hgb urine dipstick: NEGATIVE
Leukocytes,Ua: NEGATIVE
Nitrite: NEGATIVE
Specific Gravity, Urine: >=1.03 — AB (ref 1.000–1.030)
Total Protein, Urine: NEGATIVE
Urine Glucose: NEGATIVE
Urobilinogen, UA: 0.2 (ref 0.0–1.0)
pH: 5.5 (ref 5.0–8.0)

## 2021-11-06 LAB — BRAIN NATRIURETIC PEPTIDE: Pro B Natriuretic peptide (BNP): 329 pg/mL — ABNORMAL HIGH (ref 0.0–100.0)

## 2021-11-06 LAB — TSH: TSH: 2.57 u[IU]/mL (ref 0.35–5.50)

## 2021-11-25 ENCOUNTER — Encounter: Payer: Self-pay | Admitting: Oncology

## 2021-11-25 ENCOUNTER — Other Ambulatory Visit (HOSPITAL_COMMUNITY): Payer: Self-pay

## 2021-11-25 ENCOUNTER — Telehealth: Payer: Self-pay

## 2021-11-25 NOTE — Telephone Encounter (Signed)
Oral Oncology Patient Advocate Encounter  Was successful in securing patient a $8000 grant from [employer] to provide copayment coverage for [place].  This will keep the out of pocket expense at $0.     [employer] ID: [id number]  The billing information is as follows and has been shared with [place]: [id number] PCN: [id number] Member ID: [id number] Group ID: [id number] Dates of Eligibility: 10/26/21 through 10/25/22  Fund:  [place] Patient [place] Phone [phone] Fax [phone] 11/25/2021 3:27 PM

## 2021-12-02 IMAGING — PT NM PET TUM IMG SKULL BASE T - THIGH
1 of 7 series · 1 of 25 positions shown · non-contrast
Comparison: Abdominopelvic CT 12/15/2020.  Bone scan of 12/15/2020.

CLINICAL DATA: Biochemical recurrence of locally advanced prostate
cancer with enlarging soft tissue mass at the prostate
fossa/surgical bed on prior abdominopelvic CT.

EXAM:
NUCLEAR MEDICINE PET SKULL BASE TO THIGH
TECHNIQUE: 9.5 mCi F18 Piflufolastat (Pylarify) was injected intravenously.
Full-ring PET imaging was performed from the skull base to thigh
after the radiotracer. CT data was obtained and used for attenuation
correction and anatomic localization.

[Series 4: ct sk_thigh 5.0 bf37 · axial · 5.0mm · 0.87mm/px · 1 of 221 slices shown]
[im 221/221  brain]
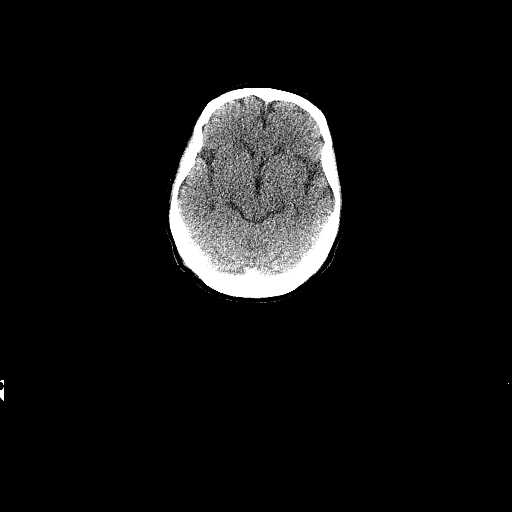

[1 of 25 positions shown; findings below may reference images not displayed]

FINDINGS: NECK

No radiotracer activity in neck lymph nodes.

Incidental CT finding: No cervical adenopathy. Bilateral carotid
atherosclerosis.

CHEST

No radiotracer accumulation within thoracic nodes or pulmonary
parenchyma.

Incidental CT finding: Tortuous thoracic aorta. Upper normal
ascending aortic caliber, 4.0 cm. Cardiomegaly, accentuated by a
mild pectus excavatum deformity. Multivessel coronary artery
atherosclerosis. No thoracic adenopathy. Clear lungs.

ABDOMEN/PELVIS

Prostate: Status post prostatectomy. The known nodule within the
operative bed, eccentric right, is somewhat obscured by bladder
tracer uptake. Measures 2.7 x 1.6 cm and a S.U.V. max of 25.9 on
183/4.

Lymph nodes: A posterior right pelvic sidewall node measures 6 mm
and a S.U.V. max of 19.5 on 178/4.

Posterior left pelvic node measures 3 mm and a S.U.V. max of 2.6 on
180/4.

Perirectal soft tissue density measures 3 mm and a S.U.V. max of
on 181/4.

Liver: No evidence of liver metastasis

Incidental CT finding: Low-density right renal lesions which are
likely cysts. 2.4 cm low-density right renal lesion is likely an
adenoma. Abdominal aortic atherosclerosis. Extensive colonic
diverticulosis. Pelvic sidewall dissection.

SKELETON

Anterior right fifth rib subtle sclerosis and tracer uptake,
including at a S.U.V. max of 4.1 on 86/4.
IMPRESSION: 1. Status post prostatectomy, with locally recurrent disease, as on
prior CT.
2. Bilateral pelvic nodal metastasis.
3. Isolated anterior right fifth rib osseous metastasis.
4. Incidental findings, including: Coronary artery atherosclerosis.
Aortic Atherosclerosis ([id number]). Borderline ascending aortic
aneurysm. Right adrenal adenoma.

## 2021-12-09 ENCOUNTER — Telehealth: Payer: Self-pay

## 2021-12-09 MED ORDER — PROMETHAZINE HCL 25 MG PO TABS: ORAL_TABLET | ORAL | 0 refills | Status: DC

## 2021-12-09 NOTE — Telephone Encounter (Signed)
Refill has been sent to [facility].

## 2021-12-09 NOTE — Telephone Encounter (Signed)
Pt wife is calling to requesting a script to [facility] promethazine (PHENERGAN) 25 MG tablet.  Pharmacy: [facility] [place], [place] 06/30/21  Pt CB [phone]

## 2021-12-11 ENCOUNTER — Inpatient Hospital Stay: Payer: Medicare Other | Attending: Oncology

## 2021-12-11 ENCOUNTER — Other Ambulatory Visit: Payer: Self-pay

## 2021-12-11 ENCOUNTER — Inpatient Hospital Stay: Payer: Medicare Other

## 2021-12-11 DIAGNOSIS — C7951 Secondary malignant neoplasm of bone: Secondary | ICD-10-CM | POA: Diagnosis not present

## 2021-12-11 DIAGNOSIS — Z8546 Personal history of malignant neoplasm of prostate: Secondary | ICD-10-CM

## 2021-12-11 DIAGNOSIS — C61 Malignant neoplasm of prostate: Secondary | ICD-10-CM | POA: Diagnosis present

## 2021-12-11 LAB — CMP (CANCER CENTER ONLY)
ALT: 7 U/L (ref 0–44)
AST: 14 U/L — ABNORMAL LOW (ref 15–41)
Albumin: 3.9 g/dL (ref 3.5–5.0)
Alkaline Phosphatase: 64 U/L (ref 38–126)
Anion gap: 6 (ref 5–15)
BUN: 18 mg/dL (ref 8–23)
CO2: 26 mmol/L (ref 22–32)
Calcium: 8.8 mg/dL — ABNORMAL LOW (ref 8.9–10.3)
Chloride: 108 mmol/L (ref 98–111)
Creatinine: 1.2 mg/dL (ref 0.61–1.24)
GFR, Estimated: 58 mL/min — ABNORMAL LOW (ref >60)
Glucose, Bld: 89 mg/dL (ref 70–99)
Potassium: 5 mmol/L (ref 3.5–5.1)
Sodium: 140 mmol/L (ref 135–145)
Total Bilirubin: 0.3 mg/dL (ref 0.3–1.2)
Total Protein: 6.3 g/dL — ABNORMAL LOW (ref 6.5–8.1)

## 2021-12-11 LAB — CBC WITH DIFFERENTIAL (CANCER CENTER ONLY)
Abs Immature Granulocytes: 0.05 10*3/uL (ref 0.00–0.07)
Basophils Absolute: 0 10*3/uL (ref 0.0–0.1)
Basophils Relative: 1 %
Eosinophils Absolute: 0.4 10*3/uL (ref 0.0–0.5)
Eosinophils Relative: 9 %
HCT: 36.6 % — ABNORMAL LOW (ref 39.0–52.0)
Hemoglobin: 12 g/dL — ABNORMAL LOW (ref 13.0–17.0)
Immature Granulocytes: 1 %
Lymphocytes Relative: 16 %
Lymphs Abs: 0.7 10*3/uL (ref 0.7–4.0)
MCH: 31.2 pg (ref 26.0–34.0)
MCHC: 32.8 g/dL (ref 30.0–36.0)
MCV: 95.1 fL (ref 80.0–100.0)
Monocytes Absolute: 0.4 10*3/uL (ref 0.1–1.0)
Monocytes Relative: 8 %
Neutro Abs: 3 10*3/uL (ref 1.7–7.7)
Neutrophils Relative %: 65 %
Platelet Count: 148 10*3/uL — ABNORMAL LOW (ref 150–400)
RBC: 3.85 MIL/uL — ABNORMAL LOW (ref 4.22–5.81)
RDW: 13.5 % (ref 11.5–15.5)
WBC Count: 4.6 10*3/uL (ref 4.0–10.5)
nRBC: 0 % (ref 0.0–0.2)

## 2021-12-12 LAB — PROSTATE-SPECIFIC AG, SERUM (LABCORP): Prostate Specific Ag, Serum: 8.4 ng/mL — ABNORMAL HIGH (ref 0.0–4.0)

## 2021-12-15 ENCOUNTER — Telehealth: Payer: Self-pay | Admitting: Oncology

## 2021-12-15 NOTE — Telephone Encounter (Signed)
Called patient regarding 02/10 phone visit, patient has been called and notified.

## 2021-12-16 ENCOUNTER — Encounter: Payer: Self-pay | Admitting: Nurse Practitioner

## 2021-12-16 ENCOUNTER — Ambulatory Visit (INDEPENDENT_AMBULATORY_CARE_PROVIDER_SITE_OTHER): Payer: Medicare Other | Admitting: Nurse Practitioner

## 2021-12-16 ENCOUNTER — Other Ambulatory Visit: Payer: Self-pay

## 2021-12-16 VITALS — BP 120/68 | HR 68 | Temp 97.7°F | Ht 68.0 in | Wt 149.8 lb

## 2021-12-16 DIAGNOSIS — Z8546 Personal history of malignant neoplasm of prostate: Secondary | ICD-10-CM

## 2021-12-16 DIAGNOSIS — I700 Atherosclerosis of aorta: Secondary | ICD-10-CM | POA: Diagnosis not present

## 2021-12-16 DIAGNOSIS — I209 Angina pectoris, unspecified: Secondary | ICD-10-CM | POA: Diagnosis not present

## 2021-12-16 DIAGNOSIS — Z7689 Persons encountering health services in other specified circumstances: Secondary | ICD-10-CM

## 2021-12-16 DIAGNOSIS — F325 Major depressive disorder, single episode, in full remission: Secondary | ICD-10-CM

## 2021-12-16 DIAGNOSIS — I10 Essential (primary) hypertension: Secondary | ICD-10-CM | POA: Diagnosis not present

## 2021-12-16 MED ORDER — NITROGLYCERIN 0.4 MG SL SUBL: 0.4000 mg | SUBLINGUAL_TABLET | SUBLINGUAL | 1 refills | Status: DC | PRN

## 2021-12-16 NOTE — Progress Notes (Signed)
New Patient Office Visit  Subjective:  Patient ID: Randy PURSELL Sr., male    DOB: 11/16/1929  Age: 88 y.o. MRN: [id number]  CC:  Chief Complaint  Patient presents with   New Patient (Initial Visit)    HPI Randy DEPOY Sr. presents to establish new primary care provider. Is changing primary providers because he wants care closer to his home. Does have CAD, but this has been stable. Has been a long time since he has seen cardiology. He has no current concerns or complaints.   Past Medical History:  Diagnosis Date   Acute MI [facility])    Arthritis    Coronary artery disease    GERD (gastroesophageal reflux disease)    Hyperlipidemia    Hypertension    Prostate cancer ([place])    Wears glasses     Past Surgical History:  Procedure Laterality Date   CORONARY ANGIOPLASTY     11/30/94 (Dr. Daneen Schick): Mild ant/anteroapical hypokinesis (known ant MI '94), EF 60%. 50-60%pLAD, 60% RI, 50% oRCA, 90% OM1 (s/p PTCA '96)   KNEE ARTHROSCOPY Right 03/01/2018   Procedure: ARTHROSCOPY KNEE;  Surgeon: Frederik Pear, MD;  Location: [facility];  Service: Orthopedics;  Laterality: Right;  debridement of medial and lateral meniscal tears    Family History  Problem Relation Age of Onset   Bone cancer Father    Breast cancer Neg Hx    Colon cancer Neg Hx    Prostate cancer Neg Hx    Pancreatic cancer Neg Hx     Social History   Socioeconomic History   Marital status: Married    Spouse name: Jeanett Schlein   Number of children: 2   Years of education: Not on file   Highest education level: Not on file  Occupational History    Comment: retired  Tobacco Use   Smoking status: Never   Smokeless tobacco: Never  Vaping Use   Vaping Use: Never used  Substance and Sexual Activity   Alcohol use: No   Drug use: No   Sexual activity: Not Currently  Other Topics Concern   Not on file  Social History Narrative   Not on file   Social Determinants of Health   Financial Resource Strain: Not on file   Food Insecurity: Not on file  Transportation Needs: Not on file  Physical Activity: Not on file  Stress: Not on file  Social Connections: Not on file  Intimate Partner Violence: Not on file    ROS Review of Systems  Constitutional:  Negative for activity change, chills, fatigue and fever.  HENT:  Negative for congestion, postnasal drip, rhinorrhea, sinus pressure, sinus pain, sneezing and sore throat.   Eyes: Negative.   Respiratory:  Negative for cough, shortness of breath and wheezing.   Cardiovascular:  Negative for chest pain and palpitations.  Gastrointestinal:  Negative for constipation, diarrhea, nausea and vomiting.  Endocrine: Negative for cold intolerance, heat intolerance, polydipsia and polyuria.  Genitourinary:  Negative for dysuria, frequency and urgency.  Musculoskeletal:  Negative for back pain and myalgias.  Skin:  Negative for rash.  Allergic/Immunologic: Negative for environmental allergies.  Neurological:  Negative for dizziness, weakness and headaches.  Psychiatric/Behavioral:  The patient is nervous/anxious.    Objective:   Today's Vitals   12/16/21 1448  BP: 120/68  Pulse: 68  Temp: 97.7 F (36.5 C)  SpO2: 100%  Weight: 149 lb 12.8 oz (67.9 kg)  Height: 5\' 8"  (1.727 m)   Body mass index is  22.78 kg/m.   Physical Exam Vitals and nursing note reviewed.  Constitutional:      Appearance: Normal appearance. He is well-developed.  HENT:     Head: Normocephalic and atraumatic.     Nose: Nose normal.     Mouth/Throat:     Mouth: Mucous membranes are moist.     Pharynx: Oropharynx is clear.  Eyes:     Pupils: Pupils are equal, round, and reactive to light.  Cardiovascular:     Rate and Rhythm: Normal rate and regular rhythm.     Pulses: Normal pulses.     Heart sounds: Normal heart sounds.  Pulmonary:     Effort: Pulmonary effort is normal.     Breath sounds: Normal breath sounds.  Abdominal:     Palpations: Abdomen is soft.   Musculoskeletal:        General: Normal range of motion.     Cervical back: Normal range of motion and neck supple.  Lymphadenopathy:     Cervical: No cervical adenopathy.  Skin:    General: Skin is warm and dry.     Capillary Refill: Capillary refill takes less than 2 seconds.  Neurological:     General: No focal deficit present.     Mental Status: He is alert and oriented to person, place, and time.  Psychiatric:        Mood and Affect: Mood normal.        Behavior: Behavior normal.        Thought Content: Thought content normal.        Judgment: Judgment normal.    Assessment & Plan:  1. Encounter to establish care Appointment today to establish new primary care provider    2. Angina pectoris (HCC) Condition stable. Continues to carry nitroglycerin with him "just in case." New prescription given as current bottle is expired. Use as needed and as prescribed.  - nitroGLYCERIN (NITROSTAT) 0.4 MG SL tablet; Place 1 tablet (0.4 mg total) under the tongue every 5 (five) minutes as needed for chest pain.  Dispense: 25 tablet; Refill: 1  3. Aortic atherosclerosis (HCC) Stable. Continue simvastatin as prescribed. Check fasting lipids at next visit and adjust dosing as indicated   4. Essential hypertension Stable. Continue bp medication as prescribed   5. Major depressive disorder with single episode, in full remission ([place]) Stable. Conitnue sertraline as prescribed   6. PROSTATE CANCER, HX OF Improving PSA. Continue regular visits with oncology as scheduled.     Problem List Items Addressed This Visit       Cardiovascular and Mediastinum   Essential hypertension   Relevant Medications   nitroGLYCERIN (NITROSTAT) 0.4 MG SL tablet   Aortic atherosclerosis (HCC)   Relevant Medications   nitroGLYCERIN (NITROSTAT) 0.4 MG SL tablet   Angina pectoris (HCC)   Relevant Medications   nitroGLYCERIN (NITROSTAT) 0.4 MG SL tablet     Other   PROSTATE CANCER, HX OF   Major  depression   Other Visit Diagnoses     Encounter to establish care    -  Primary       Outpatient Encounter Medications as of 12/16/2021  Medication Sig   ASPERCREME LIDOCAINE EX Apply 1 application topically daily as needed (pain).   aspirin 325 MG EC tablet Take 325 mg by mouth every morning.   Calcium Carb-Cholecalciferol (CALCIUM 1000 + D PO) Take 1 tablet by mouth daily.   ERLEADA 60 MG tablet Take 4 tablets (240 mg total) by mouth daily at  12 noon.   fish oil-omega-3 fatty acids 1000 MG capsule Take 1 g by mouth 2 (two) times daily.    fluticasone (FLONASE) 50 MCG/ACT nasal spray SPRAY 2 SPRAYS INTO EACH NOSTRIL EVERY DAY   furosemide (LASIX) 20 MG tablet Take 1 tablet (20 mg total) by mouth daily.   LINZESS 145 MCG CAPS capsule TAKE 1 CAPSULE (145 MCG TOTAL) BY MOUTH DAILY BEFORE BREAKFAST.   metoprolol tartrate (LOPRESSOR) 50 MG tablet TAKE 1 TABLET BY MOUTH TWICE A DAY   niacin 500 MG tablet Take 500 mg by mouth at bedtime.   promethazine (PHENERGAN) 25 MG tablet TAKE 1 TABLET BY MOUTH EVERY 8 HOURS AS NEEDED FOR NAUSEA AND VOMITING   sertraline (ZOLOFT) 100 MG tablet Take 100 mg by mouth daily.   simvastatin (ZOCOR) 20 MG tablet Take 1 tablet (20 mg total) by mouth daily at 6 PM.   zolpidem (AMBIEN) 10 MG tablet Take 1 tablet (10 mg total) by mouth at bedtime.   [DISCONTINUED] nitroGLYCERIN (NITROSTAT) 0.4 MG SL tablet Place 1 tablet (0.4 mg total) under the tongue every 5 (five) minutes as needed for chest pain.   diphenoxylate-atropine (LOMOTIL) 2.5-0.025 MG tablet PLEASE SEE ATTACHED FOR DETAILED DIRECTIONS (Patient not taking: Reported on 11/05/2021)   nitroGLYCERIN (NITROSTAT) 0.4 MG SL tablet Place 1 tablet (0.4 mg total) under the tongue every 5 (five) minutes as needed for chest pain.   No facility-administered encounter medications on file as of 12/16/2021.    Follow-up: Return in about 6 weeks (around 01/27/2022) for medicare wellness, FBW at time of visit.   [name], NP

## 2021-12-18 ENCOUNTER — Inpatient Hospital Stay (HOSPITAL_BASED_OUTPATIENT_CLINIC_OR_DEPARTMENT_OTHER): Payer: Medicare Other | Admitting: Oncology

## 2021-12-18 DIAGNOSIS — Z8546 Personal history of malignant neoplasm of prostate: Secondary | ICD-10-CM | POA: Diagnosis not present

## 2021-12-18 NOTE — Progress Notes (Signed)
Hematology and Oncology Follow Up for Telemedicine Visits  TAGE FEGGINS Sr. [id number] 1930-07-27 88 y.o. 12/18/2021 9:20 AM Junius Creamer, Greer Ee, [place], Real Cons, *   I connected with Mr. [name] on 12/18/21 at 10:00 AM EST by telephone visit and verified that I am speaking with the correct person using two identifiers.   I discussed the limitations, risks, security and privacy concerns of performing an evaluation and management service by telemedicine and the availability of in-person appointments. I also discussed with the patient that there may be a patient responsible charge related to this service. The patient expressed understanding and agreed to proceed.  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  .       Prior Therapy:   At that time he underwent a radical prostatectomy for a Gleason score 4+3 = 7 and a PSA of 6.8.  He subsequently developed a biochemical relapse and required androgen deprivation therapy prior 2016.  He subsequently developed castration resistant disease with a rise in his PSA up to 6.63 in July 2018.  Apalutamide was started and he has been on it since that time.  His nadir of PSA was 3.06 in February 2019.  His PSA was up to 6.29 in August 2019 and in December 2019 was up to 7.87  He is status post radiation therapy to the pelvic lymph nodes for a total of 68.3 Gray in 38 fractions as well as 50 Gray in 5 fractions to the rib.  Therapy concluded in June 2022.    Current therapy: Apalutamide 240 mg daily started July 2018.   Interim History: [name] reports no changes in his health.  He continues to tolerate apalutamide without any major complaints.   Medications: Reviewed without changes. Current Outpatient  Medications  Medication Sig Dispense Refill   ASPERCREME LIDOCAINE EX Apply 1 application topically daily as needed (pain).     aspirin 325 MG EC tablet Take 325 mg by mouth every morning.     Calcium Carb-Cholecalciferol (CALCIUM 1000 + D PO) Take 1 tablet by mouth daily.     diphenoxylate-atropine (LOMOTIL) 2.5-0.025 MG tablet PLEASE SEE ATTACHED FOR DETAILED DIRECTIONS (Patient not taking: Reported on 11/05/2021) 30 tablet 0   ERLEADA 60 MG tablet Take 4 tablets (240 mg total) by mouth daily at 12 noon. 120 tablet 1   fish oil-omega-3 fatty acids 1000 MG capsule Take 1 g by mouth 2 (two) times daily.      fluticasone (FLONASE) 50 MCG/ACT nasal spray SPRAY 2 SPRAYS INTO EACH NOSTRIL EVERY DAY 48 mL 1   furosemide (LASIX) 20 MG tablet Take 1 tablet (20 mg total) by mouth daily. 5 tablet 0   LINZESS 145 MCG CAPS capsule TAKE 1 CAPSULE (145 MCG TOTAL) BY MOUTH DAILY BEFORE BREAKFAST. 30 capsule 2   metoprolol tartrate (LOPRESSOR) 50 MG tablet TAKE 1 TABLET BY MOUTH TWICE A DAY 180 tablet 0   niacin 500 MG tablet Take 500 mg by mouth at bedtime.     nitroGLYCERIN (NITROSTAT) 0.4 MG SL tablet Place 1 tablet (0.4 mg total) under the tongue every 5 (five) minutes as needed for chest pain. 25 tablet 1   promethazine (PHENERGAN) 25 MG tablet TAKE 1 TABLET BY MOUTH EVERY 8 HOURS AS NEEDED FOR NAUSEA AND VOMITING 20  tablet 0   sertraline (ZOLOFT) 100 MG tablet Take 100 mg by mouth daily.     simvastatin (ZOCOR) 20 MG tablet Take 1 tablet (20 mg total) by mouth daily at 6 PM. 90 tablet 3   zolpidem (AMBIEN) 10 MG tablet Take 1 tablet (10 mg total) by mouth at bedtime. 90 tablet 1   No current facility-administered medications for this visit.     Allergies:  Allergies  Allergen Reactions   Nsaids     Had a heart attack, so can not take NSAIDS       Lab Results: Lab Results  Component Value Date   WBC 4.6 12/11/2021   HGB 12.0 (L) 12/11/2021   HCT 36.6 (L) 12/11/2021   MCV 95.1 12/11/2021    PLT 148 (L) 12/11/2021     Chemistry      Component Value Date/Time   NA 140 12/11/2021 1427   K 5.0 12/11/2021 1427   CL 108 12/11/2021 1427   CO2 26 12/11/2021 1427   BUN 18 12/11/2021 1427   CREATININE 1.20 12/11/2021 1427      Component Value Date/Time   CALCIUM 8.8 (L) 12/11/2021 1427   ALKPHOS 64 12/11/2021 1427   AST 14 (L) 12/11/2021 1427   ALT 7 12/11/2021 1427   BILITOT 0.3 12/11/2021 1427       Latest Reference Range & Units 07/09/21 09:43 09/18/21 13:56 12/11/21 14:27  Prostate Specific Ag, Serum 0.0 - 4.0 ng/mL 17.8 (H) 12.0 (H) 8.4 (H)  (H): Data is abnormally high   Impression and Plan:  88 year old man with:   1.   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  His disease status was updated at this time and his PSA continues to decline after radiation therapy outlined above.  At this time I recommended continued apalutamide and to use different salvage therapy if he developed relapsed disease in the future.  2.  Androgen deprivation therapy: He received Eligard in December 2022 and he will be repeated in April.       3.  Follow-up: In 2 months for repeat follow-up.     I discussed the assessment and treatment plan with the patient. The patient was provided an opportunity to ask questions and all were answered. The patient agreed with the plan and demonstrated an understanding of the instructions.   The patient was advised to call back or seek an in-person evaluation if the symptoms worsen or if the condition fails to improve as anticipated.  I provided 20 minutes of non face-to-face telephone visit time during this encounter.  The time was spent on reviewing his disease status, reviewing laboratory data, discussing complications related to cancer and cancer therapy.     Zola Button, MD 12/18/2021 9:20 AM

## 2021-12-21 DIAGNOSIS — I209 Angina pectoris, unspecified: Secondary | ICD-10-CM | POA: Insufficient documentation

## 2021-12-31 NOTE — Telephone Encounter (Signed)
This encounter was created in error - please disregard.

## 2022-01-06 ENCOUNTER — Other Ambulatory Visit: Payer: Self-pay | Admitting: Nurse Practitioner

## 2022-01-06 ENCOUNTER — Telehealth: Payer: Self-pay | Admitting: Nurse Practitioner

## 2022-01-06 DIAGNOSIS — F5101 Primary insomnia: Secondary | ICD-10-CM

## 2022-01-06 MED ORDER — ZOLPIDEM TARTRATE 10 MG PO TABS: 10.0000 mg | ORAL_TABLET | Freq: Every day | ORAL | 0 refills | Status: DC

## 2022-01-06 NOTE — Telephone Encounter (Signed)
Please let him know I have taken care of this for him.  Thanks so much.   -HB

## 2022-01-06 NOTE — Telephone Encounter (Signed)
Patient is aware 

## 2022-01-06 NOTE — Telephone Encounter (Signed)
Patient requesting refill of his Ambien a 90 day supply to [facility]. Please advise. [phone] ?

## 2022-01-12 ENCOUNTER — Telehealth: Payer: Self-pay

## 2022-01-12 ENCOUNTER — Telehealth: Payer: Self-pay | Admitting: Nurse Practitioner

## 2022-01-12 ENCOUNTER — Other Ambulatory Visit: Payer: Self-pay

## 2022-01-12 DIAGNOSIS — E782 Mixed hyperlipidemia: Secondary | ICD-10-CM

## 2022-01-12 MED ORDER — SIMVASTATIN 20 MG PO TABS: 20.0000 mg | ORAL_TABLET | Freq: Every day | ORAL | 3 refills | Status: DC

## 2022-01-12 NOTE — Telephone Encounter (Signed)
Patient requesting a refill of his simvastatin. Please advise. [phone] ?

## 2022-01-12 NOTE — Telephone Encounter (Signed)
Oral Oncology Patient Advocate Encounter ? ?Met patient in lobby room to complete application for [employer] in an effort to reduce patient's out of pocket expense for Erleada to $0.   ? ?Application completed and faxed to [phone].  ? ?[employer] patient assistance phone number for follow up is [phone].  ? ?This encounter will be updated until final determination.  ? ?Wynn Maudlin CPHT ?Specialty Pharmacy Patient Advocate ?[facility] [place] ?Phone [phone] ?Fax [phone] ?01/12/2022 8:11 AM ? ? ?

## 2022-01-12 NOTE — Telephone Encounter (Signed)
Rx was sent to pharmacy. 

## 2022-01-14 ENCOUNTER — Other Ambulatory Visit: Payer: Self-pay | Admitting: Internal Medicine

## 2022-01-18 ENCOUNTER — Other Ambulatory Visit: Payer: Self-pay

## 2022-01-18 ENCOUNTER — Telehealth: Payer: Self-pay

## 2022-01-18 MED ORDER — PROMETHAZINE HCL 25 MG PO TABS: ORAL_TABLET | ORAL | 0 refills | Status: DC

## 2022-01-18 NOTE — Telephone Encounter (Signed)
Phone call from patient asking when he will receive mail order Erleada.  There is a note from E. Wynonia Lawman in pharmacy stating that pt assistance faxed to [employer]. Spoke with Benjamine Mola and she has been unable to reach [place] to confirm receipt of application. It's believed that patient has not had this medication since the beginning of the year. Patient is asking if there is another medication that could replace Erleada.  ? ?Routed to provider ?

## 2022-01-19 ENCOUNTER — Other Ambulatory Visit: Payer: Self-pay | Admitting: Nurse Practitioner

## 2022-01-19 ENCOUNTER — Telehealth: Payer: Self-pay

## 2022-01-19 ENCOUNTER — Telehealth: Payer: Self-pay | Admitting: Nurse Practitioner

## 2022-01-19 ENCOUNTER — Other Ambulatory Visit: Payer: Self-pay | Admitting: Oncology

## 2022-01-19 ENCOUNTER — Other Ambulatory Visit (HOSPITAL_COMMUNITY): Payer: Self-pay

## 2022-01-19 DIAGNOSIS — K581 Irritable bowel syndrome with constipation: Secondary | ICD-10-CM

## 2022-01-19 MED ORDER — LINACLOTIDE 145 MCG PO CAPS: ORAL_CAPSULE | ORAL | 2 refills | Status: DC

## 2022-01-19 MED ORDER — NUBEQA 300 MG PO TABS
600.0000 mg | ORAL_TABLET | Freq: Two times a day (BID) | ORAL | 1 refills | Status: DC
  Filled 2022-01-19: qty 120, 30d supply, fill #0

## 2022-01-19 NOTE — Progress Notes (Signed)
Renewed linzess 13mg and sent to [facility].  ?

## 2022-01-19 NOTE — Telephone Encounter (Signed)
Patient requesting refill of Linzess 145 mcg. Please advise. [phone] ?

## 2022-01-19 NOTE — Telephone Encounter (Signed)
Patient is aware however they need it sent to [facility] on [address]. I'm sorry for the inconvenience.  ?

## 2022-01-19 NOTE — Telephone Encounter (Signed)
Renewed linzess 164mg and sent to [facility].

## 2022-01-19 NOTE — Telephone Encounter (Signed)
No worries. [facility]

## 2022-01-19 NOTE — Progress Notes (Signed)
[facility]  ?

## 2022-01-20 NOTE — Telephone Encounter (Signed)
Oral Oncology Pharmacist Encounter ? ?Received new prescription for darolutamide Norville Haggard) for the treatment of prostate cancer, planned duration until disease progression or unacceptable toxicity. Prescription dose and frequency assessed. ? ?Labs from 12/11/2021 assessed, no interventions needed. ? ?Current medication list in Epic reviewed, DDIs with Nubeqa identified: ?- simvastatin: monitor for increased toxicities of simvastatin (cat C) ? ?Evaluated chart and no patient barriers to medication adherence noted.  ? ?Patient agreement for treatment documented in MD note on 01/19/2022. ? ?Prescription has been e-scribed to the [facility] for benefits analysis and approval. ? ?Oral Oncology Clinic will continue to follow for insurance authorization, copayment issues, initial counseling and start date. ? ?Drema Halon, PharmD ?Hematology/Oncology Clinical Pharmacist ?[facility] ?[phone] ?01/20/2022 10:48 AM ? ? ?

## 2022-01-21 ENCOUNTER — Telehealth: Payer: Self-pay

## 2022-01-21 NOTE — Telephone Encounter (Signed)
Oral Oncology Patient Advocate Encounter ? ?Met patient in lobby room to complete application for [employer] in an effort to reduce patient's out of pocket expense for Nubeqa to $0.   ? ?Application completed and faxed to [phone].  ? ?[place] patient assistance phone number for follow up is [phone].  ? ?This encounter will be updated until final determination.  ? ?Wynn Maudlin CPHT ?Specialty Pharmacy Patient Advocate ?[place] ?Phone [phone] ?Fax [phone] ?01/21/2022 3:15 PM ? ? ?

## 2022-01-26 NOTE — Telephone Encounter (Signed)
Patient is approved for Nubeaq at no cost from [employer] 01/26/22-11/07/22 ? ?[place] uses [employer] ? ?Wynn Maudlin CPHT ?Specialty Pharmacy Patient Advocate ?[place] ?Phone [phone] ?Fax [phone] ?01/26/2022 9:43 AM ? ?

## 2022-01-27 ENCOUNTER — Encounter: Payer: Medicare Other | Admitting: Nurse Practitioner

## 2022-01-27 NOTE — Telephone Encounter (Signed)
Patient changed therapy to Nubeqa. ? ?Wynn Maudlin CPHT ?Specialty Pharmacy Patient Advocate ?[place] ?Phone [phone] ?Fax [phone] ?01/27/2022 10:06 AM ? ?

## 2022-01-28 NOTE — Telephone Encounter (Signed)
Oral Chemotherapy Pharmacist Encounter ? ?I spoke with patient for overview of: Nubeqa for the treatment of advanced, castration-resistant prostate cancer, planned duration until disease progression or unacceptable toxicity.  ? ?Counseled patient on administration, dosing, side effects, monitoring, drug-food interactions, safe handling, storage, and disposal. ? ?Patient will take Nubeqa '300mg'$  tablets and will take 2 tablets ('600mg'$ ) by mouth twice daily with a meal.  ? ?Patient knows to stay away from grapefruit or grapefruit juice while on the medication.  ? ?Nubeqa start date: 01/28/2022 ? ?Adverse effects include but are not limited to: decreased blood counts, increased LFTs, fatigue, GI toxicity, arthralgias, etc. ? ?Reviewed with patient importance of keeping a medication schedule and plan for any missed doses. No barriers to medication adherence identified. ? ?Medication reconciliation performed and medication/allergy list updated. ? ?Insurance authorization for Norville Haggard has been obtained. ?Patient will receive medication through  ? ?Patient informed the pharmacy will reach out 5-7 days prior to needing next fill of Nubeqa to coordinate continued medication acquisition to prevent break in therapy. ? ?All questions answered. ? ?Mr. Bromwell Sr. voiced understanding and appreciation.  ? ?Medication education handout placed in mail for patient. Patient knows to call the office with questions or concerns. Oral Chemotherapy Clinic phone number provided to patient.  ? ?Drema Halon, PharmD ?Hematology/Oncology Clinical Pharmacist ?[facility] ?[phone] ?01/28/2022   9:55 AM ?

## 2022-02-03 ENCOUNTER — Telehealth: Payer: Self-pay | Admitting: Nurse Practitioner

## 2022-02-03 ENCOUNTER — Other Ambulatory Visit: Payer: Self-pay | Admitting: Nurse Practitioner

## 2022-02-03 DIAGNOSIS — F325 Major depressive disorder, single episode, in full remission: Secondary | ICD-10-CM

## 2022-02-03 MED ORDER — SERTRALINE HCL 100 MG PO TABS: 100.0000 mg | ORAL_TABLET | Freq: Every day | ORAL | 0 refills | Status: DC

## 2022-02-03 NOTE — Telephone Encounter (Signed)
It's ok. I have sent this to [facility]

## 2022-02-03 NOTE — Telephone Encounter (Signed)
Patient is aware 

## 2022-02-03 NOTE — Telephone Encounter (Signed)
It's no problem . I have sent 90 day prescription to [facility] on [address].

## 2022-02-03 NOTE — Telephone Encounter (Signed)
I'm sorry, he wants it sent to [facility] ?

## 2022-02-03 NOTE — Telephone Encounter (Signed)
Patient requesting refill of Zoloft. Please advise. [phone] ?

## 2022-02-03 NOTE — Telephone Encounter (Signed)
Ok. Where does he want to send this to?

## 2022-02-16 ENCOUNTER — Other Ambulatory Visit: Payer: Self-pay | Admitting: Internal Medicine

## 2022-02-16 DIAGNOSIS — I10 Essential (primary) hypertension: Secondary | ICD-10-CM

## 2022-02-17 ENCOUNTER — Other Ambulatory Visit: Payer: Self-pay

## 2022-02-17 ENCOUNTER — Telehealth: Payer: Self-pay | Admitting: Nurse Practitioner

## 2022-02-17 NOTE — Telephone Encounter (Signed)
Patient is requesting refill of his Metoprolol. Please advise.  ?

## 2022-02-17 NOTE — Telephone Encounter (Signed)
Pt is advised of medication that was sent ?

## 2022-02-22 ENCOUNTER — Inpatient Hospital Stay: Payer: Medicare Other | Attending: Oncology

## 2022-02-22 ENCOUNTER — Inpatient Hospital Stay: Payer: Medicare Other

## 2022-02-22 ENCOUNTER — Other Ambulatory Visit: Payer: Self-pay

## 2022-02-22 DIAGNOSIS — R5383 Other fatigue: Secondary | ICD-10-CM | POA: Insufficient documentation

## 2022-02-22 DIAGNOSIS — Z79818 Long term (current) use of other agents affecting estrogen receptors and estrogen levels: Secondary | ICD-10-CM | POA: Insufficient documentation

## 2022-02-22 DIAGNOSIS — Z79899 Other long term (current) drug therapy: Secondary | ICD-10-CM | POA: Diagnosis not present

## 2022-02-22 DIAGNOSIS — Z923 Personal history of irradiation: Secondary | ICD-10-CM | POA: Insufficient documentation

## 2022-02-22 DIAGNOSIS — C7951 Secondary malignant neoplasm of bone: Secondary | ICD-10-CM | POA: Insufficient documentation

## 2022-02-22 DIAGNOSIS — M2550 Pain in unspecified joint: Secondary | ICD-10-CM | POA: Diagnosis not present

## 2022-02-22 DIAGNOSIS — Z7982 Long term (current) use of aspirin: Secondary | ICD-10-CM | POA: Diagnosis not present

## 2022-02-22 DIAGNOSIS — C61 Malignant neoplasm of prostate: Secondary | ICD-10-CM | POA: Insufficient documentation

## 2022-02-22 DIAGNOSIS — Z8546 Personal history of malignant neoplasm of prostate: Secondary | ICD-10-CM

## 2022-02-22 LAB — CMP (CANCER CENTER ONLY)
ALT: 8 U/L (ref 0–44)
AST: 12 U/L — ABNORMAL LOW (ref 15–41)
Albumin: 4 g/dL (ref 3.5–5.0)
Alkaline Phosphatase: 59 U/L (ref 38–126)
Anion gap: 6 (ref 5–15)
BUN: 19 mg/dL (ref 8–23)
CO2: 28 mmol/L (ref 22–32)
Calcium: 9.1 mg/dL (ref 8.9–10.3)
Chloride: 107 mmol/L (ref 98–111)
Creatinine: 1.11 mg/dL (ref 0.61–1.24)
GFR, Estimated: >60 mL/min (ref >60)
Glucose, Bld: 96 mg/dL (ref 70–99)
Potassium: 4.2 mmol/L (ref 3.5–5.1)
Sodium: 141 mmol/L (ref 135–145)
Total Bilirubin: 0.5 mg/dL (ref 0.3–1.2)
Total Protein: 6.2 g/dL — ABNORMAL LOW (ref 6.5–8.1)

## 2022-02-22 LAB — CBC WITH DIFFERENTIAL (CANCER CENTER ONLY)
Abs Immature Granulocytes: 0.01 10*3/uL (ref 0.00–0.07)
Basophils Absolute: 0 10*3/uL (ref 0.0–0.1)
Basophils Relative: 1 %
Eosinophils Absolute: 0.3 10*3/uL (ref 0.0–0.5)
Eosinophils Relative: 6 %
HCT: 36.3 % — ABNORMAL LOW (ref 39.0–52.0)
Hemoglobin: 12.2 g/dL — ABNORMAL LOW (ref 13.0–17.0)
Immature Granulocytes: 0 %
Lymphocytes Relative: 15 %
Lymphs Abs: 0.7 10*3/uL (ref 0.7–4.0)
MCH: 31.2 pg (ref 26.0–34.0)
MCHC: 33.6 g/dL (ref 30.0–36.0)
MCV: 92.8 fL (ref 80.0–100.0)
Monocytes Absolute: 0.4 10*3/uL (ref 0.1–1.0)
Monocytes Relative: 9 %
Neutro Abs: 3 10*3/uL (ref 1.7–7.7)
Neutrophils Relative %: 69 %
Platelet Count: 126 10*3/uL — ABNORMAL LOW (ref 150–400)
RBC: 3.91 MIL/uL — ABNORMAL LOW (ref 4.22–5.81)
RDW: 13.1 % (ref 11.5–15.5)
WBC Count: 4.3 10*3/uL (ref 4.0–10.5)
nRBC: 0 % (ref 0.0–0.2)

## 2022-02-23 LAB — PROSTATE-SPECIFIC AG, SERUM (LABCORP): Prostate Specific Ag, Serum: 6.7 ng/mL — ABNORMAL HIGH (ref 0.0–4.0)

## 2022-02-24 ENCOUNTER — Inpatient Hospital Stay: Payer: Medicare Other | Admitting: Oncology

## 2022-02-24 ENCOUNTER — Inpatient Hospital Stay: Payer: Medicare Other

## 2022-02-24 ENCOUNTER — Other Ambulatory Visit: Payer: Medicare Other

## 2022-02-24 ENCOUNTER — Other Ambulatory Visit: Payer: Self-pay

## 2022-02-24 VITALS — BP 108/62 | HR 65 | Temp 97.3°F | Resp 17 | Wt 147.4 lb

## 2022-02-24 DIAGNOSIS — Z8546 Personal history of malignant neoplasm of prostate: Secondary | ICD-10-CM

## 2022-02-24 DIAGNOSIS — Z79899 Other long term (current) drug therapy: Secondary | ICD-10-CM | POA: Diagnosis not present

## 2022-02-24 DIAGNOSIS — C61 Malignant neoplasm of prostate: Secondary | ICD-10-CM | POA: Diagnosis not present

## 2022-02-24 DIAGNOSIS — R5383 Other fatigue: Secondary | ICD-10-CM | POA: Diagnosis not present

## 2022-02-24 DIAGNOSIS — Z923 Personal history of irradiation: Secondary | ICD-10-CM | POA: Diagnosis not present

## 2022-02-24 DIAGNOSIS — Z7982 Long term (current) use of aspirin: Secondary | ICD-10-CM | POA: Diagnosis not present

## 2022-02-24 DIAGNOSIS — C7951 Secondary malignant neoplasm of bone: Secondary | ICD-10-CM | POA: Diagnosis not present

## 2022-02-24 DIAGNOSIS — M2550 Pain in unspecified joint: Secondary | ICD-10-CM | POA: Diagnosis not present

## 2022-02-24 MED ORDER — LEUPROLIDE ACETATE (4 MONTH) 30 MG ~~LOC~~ KIT
30.0000 mg | PACK | Freq: Once | SUBCUTANEOUS | Status: AC
  Administered 2022-02-24: 30 mg via SUBCUTANEOUS
  Filled 2022-02-24: qty 30

## 2022-02-24 NOTE — Progress Notes (Signed)
Hematology and Oncology Follow Up  ? ?Randy BARNIER Sr. ?[id number] ?Dec 17, 1929 88 y.o. ?02/24/2022 2:42 PM ?Randy [name], NPBoscia, Randy Ee, NP  ? ? ? ? ? 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  .   ?  ?  ?Prior Therapy: ?  ?At that time he underwent a radical prostatectomy for a Gleason score 4+3 = 7 and a PSA of 6.8.  He subsequently developed a biochemical relapse and required androgen deprivation therapy prior 2016.  He subsequently developed castration resistant disease with a rise in his PSA up to 6.63 in July 2018.  Apalutamide was started and he has been on it since that time.  His nadir of PSA was 3.06 in February 2019.  His PSA was up to 6.29 in August 2019 and in December 2019 was up to 7.87 ? ?He is status post radiation therapy to the pelvic lymph nodes for a total of 68.3 Gray in 38 fractions as well as 50 Gray in 5 fractions to the rib.  Therapy concluded in June 2022. ? ?  ?Current therapy: Apalutamide 240 mg daily started July 2018. ?  ?Interim History: Randy [name] returns today for a follow-up visit.  Since last visit, he reports feeling well without any major complaints.  He continues to tolerate apalutamide without any concerns.  He denies any recent hospitalizations or illnesses.  He remains active and continues to attend to activities of daily living. ? ? ?Medications: Updated on review. ?Current Outpatient Medications  ?Medication Sig Dispense Refill  ? ASPERCREME LIDOCAINE EX Apply 1 application topically daily as needed (pain).    ? aspirin 325 MG EC tablet Take 325 mg by mouth every morning.    ? Calcium Carb-Cholecalciferol (CALCIUM 1000 + D PO) Take 1 tablet by mouth daily.    ? darolutamide (NUBEQA) 300 MG tablet Take 2 tablets (600 mg total) by mouth 2 (two) times daily with a meal. 120 tablet 1  ? diphenoxylate-atropine (LOMOTIL) 2.5-0.025 MG tablet PLEASE SEE ATTACHED FOR  DETAILED DIRECTIONS (Patient not taking: Reported on 11/05/2021) 30 tablet 0  ? fish oil-omega-3 fatty acids 1000 MG capsule Take 1 g by mouth 2 (two) times daily.     ? fluticasone (FLONASE) 50 MCG/ACT nasal spray SPRAY 2 SPRAYS INTO EACH NOSTRIL EVERY DAY 48 mL 1  ? furosemide (LASIX) 20 MG tablet Take 1 tablet (20 mg total) by mouth daily. 5 tablet 0  ? linaclotide (LINZESS) 145 MCG CAPS capsule TAKE 1 CAPSULE (145 MCG TOTAL) BY MOUTH DAILY BEFORE BREAKFAST. 30 capsule 2  ? metoprolol tartrate (LOPRESSOR) 50 MG tablet Take 1 tablet by mouth twice daily 180 tablet 0  ? niacin 500 MG tablet Take 500 mg by mouth at bedtime.    ? nitroGLYCERIN (NITROSTAT) 0.4 MG SL tablet Place 1 tablet (0.4 mg total) under the tongue every 5 (five) minutes as needed for chest pain. 25 tablet 1  ? promethazine (PHENERGAN) 25 MG tablet TAKE 1 TABLET BY MOUTH EVERY 8 HOURS AS NEEDED FOR NAUSEA AND VOMITING 20 tablet 0  ? sertraline (ZOLOFT) 100 MG tablet Take 1 tablet (100 mg total) by mouth daily. 90 tablet 0  ? simvastatin (ZOCOR) 20 MG tablet Take 1 tablet (20 mg total) by mouth daily at 6 PM. 90 tablet 3  ? zolpidem (AMBIEN) 10 MG tablet Take 1 tablet (10 mg total) by mouth at bedtime. 90 tablet 0  ? ?No  current facility-administered medications for this visit.  ? ? ? ?Allergies:  ?Allergies  ?Allergen Reactions  ? Nsaids   ?  Had a heart attack, so can not take NSAIDS  ? ?Physical exam ?Blood pressure 108/62, pulse 65, temperature (!) 97.3 ?F (36.3 ?C), temperature source Temporal, resp. rate 17, weight 147 lb 6 oz (66.8 kg), SpO2 100 %. ? ?ECOG 1 ? ?General appearance: Comfortable appearing without any discomfort ?Head: Normocephalic without any trauma ?Oropharynx: Mucous membranes are moist and pink without any thrush or ulcers. ?Eyes: Pupils are equal and round reactive to light. ?Lymph nodes: No cervical, supraclavicular, inguinal or axillary lymphadenopathy.   ?Heart:regular rate and rhythm.  S1 and S2 without leg  edema. ?Lung: Clear without any rhonchi or wheezes.  No dullness to percussion. ?Abdomin: Soft, nontender, nondistended with good bowel sounds.  No hepatosplenomegaly. ?Musculoskeletal: No joint deformity or effusion.  Full range of motion noted. ?Neurological: No deficits noted on motor, sensory and deep tendon reflex exam. ?Skin: No petechial rash or dryness.  Appeared moist.  ?Psychiatric: Mood and affect appeared appropriate. ? ? ? ?Lab Results: ?Lab Results  ?Component Value Date  ? WBC 4.3 02/22/2022  ? HGB 12.2 (L) 02/22/2022  ? HCT 36.3 (L) 02/22/2022  ? MCV 92.8 02/22/2022  ? PLT 126 (L) 02/22/2022  ? ?  Chemistry   ?   ?Component Value Date/Time  ? NA 141 02/22/2022 1452  ? K 4.2 02/22/2022 1452  ? CL 107 02/22/2022 1452  ? CO2 28 02/22/2022 1452  ? BUN 19 02/22/2022 1452  ? CREATININE 1.11 02/22/2022 1452  ?    ?Component Value Date/Time  ? CALCIUM 9.1 02/22/2022 1452  ? ALKPHOS 59 02/22/2022 1452  ? AST 12 (L) 02/22/2022 1452  ? ALT 8 02/22/2022 1452  ? BILITOT 0.5 02/22/2022 1452  ?  ? ? Latest Reference Range & Units 07/09/21 09:43 09/18/21 13:56 12/11/21 14:27 02/22/22 14:52  ?Prostate Specific Ag, Serum 0.0 - 4.0 ng/mL 17.8 (H) 12.0 (H) 8.4 (H) 6.7 (H)  ?(H): Data is abnormally high ? ? ?Impression and Plan: ? ?88 year old man with: ?  ?1.   Advanced prostate cancer with disease to lymph nodes and bone diagnosed in 2018.  He has castration-resistant at this time. ? ?He continues to be on apalutamide which she has tolerated very well with PSA continues to decline.  Complication associated with this treatment including excessive fatigue, weakness and arthralgias were reiterated.  Alternative treatment options including systemic chemotherapy will be deferred unless he has progression of disease.  He is agreeable to continue at this time. ? ?2.  Androgen deprivation therapy: I recommended continuing this indefinitely.  He will receive Eligard today and repeated in 4 months.  Complications including  weight gain, hot flashes were reiterated. ?  ?  ?  ?3.  Follow-up: In 4 months for repeat follow-up. ?  ? ? ?30  minutes were dedicated to this visit. The time was spent on reviewing laboratory data, discussing treatment options, discussing differential diagnosis and answering questions regarding future plan. ? ? ? ?Zola Button, MD 02/24/2022 2:42 PM ? ?

## 2022-02-27 ENCOUNTER — Other Ambulatory Visit: Payer: Self-pay | Admitting: Nurse Practitioner

## 2022-02-27 ENCOUNTER — Other Ambulatory Visit: Payer: Self-pay | Admitting: Adult Health

## 2022-02-27 DIAGNOSIS — R600 Localized edema: Secondary | ICD-10-CM

## 2022-03-11 ENCOUNTER — Other Ambulatory Visit: Payer: Self-pay | Admitting: Nurse Practitioner

## 2022-03-11 DIAGNOSIS — F5101 Primary insomnia: Secondary | ICD-10-CM

## 2022-04-30 ENCOUNTER — Telehealth: Payer: Self-pay | Admitting: Nurse Practitioner

## 2022-04-30 ENCOUNTER — Other Ambulatory Visit: Payer: Self-pay | Admitting: Nurse Practitioner

## 2022-04-30 DIAGNOSIS — F5101 Primary insomnia: Secondary | ICD-10-CM

## 2022-05-03 ENCOUNTER — Other Ambulatory Visit: Payer: Self-pay

## 2022-05-03 ENCOUNTER — Other Ambulatory Visit: Payer: Self-pay | Admitting: Nurse Practitioner

## 2022-05-03 DIAGNOSIS — F5101 Primary insomnia: Secondary | ICD-10-CM

## 2022-05-03 MED ORDER — ZOLPIDEM TARTRATE 10 MG PO TABS: 10.0000 mg | ORAL_TABLET | Freq: Every day | ORAL | 0 refills | Status: DC

## 2022-05-13 ENCOUNTER — Other Ambulatory Visit: Payer: Self-pay | Admitting: *Deleted

## 2022-05-13 ENCOUNTER — Other Ambulatory Visit: Payer: Self-pay | Admitting: Nurse Practitioner

## 2022-05-13 ENCOUNTER — Telehealth: Payer: Self-pay | Admitting: *Deleted

## 2022-05-13 ENCOUNTER — Other Ambulatory Visit (HOSPITAL_COMMUNITY): Payer: Self-pay

## 2022-05-13 DIAGNOSIS — Z8546 Personal history of malignant neoplasm of prostate: Secondary | ICD-10-CM

## 2022-05-13 DIAGNOSIS — F325 Major depressive disorder, single episode, in full remission: Secondary | ICD-10-CM

## 2022-05-13 MED ORDER — ERLEADA 60 MG PO TABS: 240.0000 mg | ORAL_TABLET | Freq: Every day | ORAL | 1 refills | Status: DC

## 2022-05-13 MED ORDER — NUBEQA 300 MG PO TABS: 600.0000 mg | ORAL_TABLET | Freq: Two times a day (BID) | ORAL | 1 refills | Status: DC

## 2022-05-13 NOTE — Telephone Encounter (Signed)
Rx for Erleada 60 mg tablets faxed to [facility], [phone].  Fax confirmation received.

## 2022-05-14 ENCOUNTER — Encounter: Payer: Self-pay | Admitting: Oncology

## 2022-05-14 ENCOUNTER — Telehealth: Payer: Self-pay

## 2022-05-14 ENCOUNTER — Other Ambulatory Visit (HOSPITAL_COMMUNITY): Payer: Self-pay

## 2022-05-14 ENCOUNTER — Other Ambulatory Visit: Payer: Self-pay | Admitting: *Deleted

## 2022-05-14 ENCOUNTER — Other Ambulatory Visit: Payer: Self-pay | Admitting: Nurse Practitioner

## 2022-05-14 DIAGNOSIS — Z8546 Personal history of malignant neoplasm of prostate: Secondary | ICD-10-CM

## 2022-05-14 DIAGNOSIS — I10 Essential (primary) hypertension: Secondary | ICD-10-CM

## 2022-05-14 MED ORDER — ERLEADA 60 MG PO TABS
240.0000 mg | ORAL_TABLET | Freq: Every day | ORAL | 1 refills | Status: DC
  Filled 2022-05-14 (×3): qty 120, 30d supply, fill #0

## 2022-05-14 NOTE — Telephone Encounter (Signed)
Oral Oncology Pharmacist Encounter  Received refill request for patients Erleada (apalutamide). Patient never received shipment of Nubeqa but instead had still been receiving the medication, Erleada. Patient has continued Erleada medication and Dr. Alen Blew has been aware in past office visit that patient continued on Erleada. Patient was receiving Erleada from [place] patient assistance. The pharmacy office had not been notified by Alphonsa Overall that patient was approved and still receiving medication.   Spoke to patient and Dr. Alen Blew and we will be continuing with Erleada. Patient is out of medication at this time and [employer] Overall has been notified.   Prescriptions for [employer] patient assistance has to be faxed to Alphonsa Overall who will redirect the prescription to [facility] specialty pharmacy. The fax number is [phone].   Patient has an active [employer] and will dispense this months medication from [facility] so that patient can continue medication while waiting for [employer] approval.   Drema Halon, PharmD Hematology/Oncology Clinical Pharmacist [facility] [phone]

## 2022-05-18 ENCOUNTER — Other Ambulatory Visit (HOSPITAL_COMMUNITY): Payer: Self-pay

## 2022-05-18 ENCOUNTER — Other Ambulatory Visit: Payer: Self-pay | Admitting: Nurse Practitioner

## 2022-05-25 ENCOUNTER — Telehealth: Payer: Self-pay | Admitting: Oncology

## 2022-05-25 NOTE — Telephone Encounter (Signed)
Rescheduled August appointments per provider pal, called to inform patient about new upcoming rescheduled appointment. Patient is notified.

## 2022-06-13 ENCOUNTER — Other Ambulatory Visit: Payer: Self-pay | Admitting: Nurse Practitioner

## 2022-06-13 DIAGNOSIS — F325 Major depressive disorder, single episode, in full remission: Secondary | ICD-10-CM

## 2022-06-21 ENCOUNTER — Inpatient Hospital Stay (HOSPITAL_COMMUNITY)
Admission: EM | Admit: 2022-06-21 | Discharge: 2022-06-25 | DRG: 281 | Disposition: A | Discharge status: Home / Self Care | Payer: Medicare Other | Attending: Internal Medicine | Admitting: Internal Medicine

## 2022-06-21 ENCOUNTER — Observation Stay (HOSPITAL_COMMUNITY): Payer: Medicare Other

## 2022-06-21 ENCOUNTER — Emergency Department (HOSPITAL_COMMUNITY): Payer: Medicare Other

## 2022-06-21 ENCOUNTER — Encounter (HOSPITAL_COMMUNITY): Payer: Self-pay | Admitting: Internal Medicine

## 2022-06-21 DIAGNOSIS — R079 Chest pain, unspecified: Secondary | ICD-10-CM | POA: Diagnosis present

## 2022-06-21 DIAGNOSIS — D696 Thrombocytopenia, unspecified: Secondary | ICD-10-CM | POA: Diagnosis present

## 2022-06-21 DIAGNOSIS — Z8679 Personal history of other diseases of the circulatory system: Secondary | ICD-10-CM

## 2022-06-21 DIAGNOSIS — C61 Malignant neoplasm of prostate: Secondary | ICD-10-CM | POA: Diagnosis present

## 2022-06-21 DIAGNOSIS — I959 Hypotension, unspecified: Secondary | ICD-10-CM | POA: Diagnosis present

## 2022-06-21 DIAGNOSIS — R9431 Abnormal electrocardiogram [ECG] [EKG]: Secondary | ICD-10-CM

## 2022-06-21 DIAGNOSIS — I4891 Unspecified atrial fibrillation: Secondary | ICD-10-CM | POA: Diagnosis not present

## 2022-06-21 DIAGNOSIS — R778 Other specified abnormalities of plasma proteins: Secondary | ICD-10-CM | POA: Diagnosis present

## 2022-06-21 DIAGNOSIS — N289 Disorder of kidney and ureter, unspecified: Secondary | ICD-10-CM

## 2022-06-21 DIAGNOSIS — R112 Nausea with vomiting, unspecified: Secondary | ICD-10-CM

## 2022-06-21 DIAGNOSIS — I214 Non-ST elevation (NSTEMI) myocardial infarction: Secondary | ICD-10-CM

## 2022-06-21 DIAGNOSIS — E785 Hyperlipidemia, unspecified: Secondary | ICD-10-CM | POA: Diagnosis present

## 2022-06-21 DIAGNOSIS — I130 Hypertensive heart and chronic kidney disease with heart failure and stage 1 through stage 4 chronic kidney disease, or unspecified chronic kidney disease: Secondary | ICD-10-CM | POA: Diagnosis not present

## 2022-06-21 DIAGNOSIS — Z79899 Other long term (current) drug therapy: Secondary | ICD-10-CM | POA: Diagnosis not present

## 2022-06-21 DIAGNOSIS — N281 Cyst of kidney, acquired: Secondary | ICD-10-CM | POA: Diagnosis not present

## 2022-06-21 DIAGNOSIS — I5022 Chronic systolic (congestive) heart failure: Secondary | ICD-10-CM | POA: Diagnosis present

## 2022-06-21 DIAGNOSIS — I25119 Atherosclerotic heart disease of native coronary artery with unspecified angina pectoris: Secondary | ICD-10-CM | POA: Diagnosis not present

## 2022-06-21 DIAGNOSIS — N182 Chronic kidney disease, stage 2 (mild): Secondary | ICD-10-CM | POA: Diagnosis not present

## 2022-06-21 DIAGNOSIS — Z808 Family history of malignant neoplasm of other organs or systems: Secondary | ICD-10-CM | POA: Diagnosis not present

## 2022-06-21 DIAGNOSIS — R627 Adult failure to thrive: Secondary | ICD-10-CM | POA: Diagnosis not present

## 2022-06-21 DIAGNOSIS — J90 Pleural effusion, not elsewhere classified: Secondary | ICD-10-CM | POA: Diagnosis not present

## 2022-06-21 DIAGNOSIS — I252 Old myocardial infarction: Secondary | ICD-10-CM | POA: Diagnosis not present

## 2022-06-21 DIAGNOSIS — Z820 Family history of epilepsy and other diseases of the nervous system: Secondary | ICD-10-CM | POA: Diagnosis not present

## 2022-06-21 DIAGNOSIS — I9589 Other hypotension: Secondary | ICD-10-CM | POA: Diagnosis not present

## 2022-06-21 DIAGNOSIS — Z955 Presence of coronary angioplasty implant and graft: Secondary | ICD-10-CM

## 2022-06-21 DIAGNOSIS — R0789 Other chest pain: Secondary | ICD-10-CM | POA: Diagnosis present

## 2022-06-21 DIAGNOSIS — R6889 Other general symptoms and signs: Secondary | ICD-10-CM | POA: Diagnosis not present

## 2022-06-21 DIAGNOSIS — I499 Cardiac arrhythmia, unspecified: Secondary | ICD-10-CM | POA: Diagnosis not present

## 2022-06-21 DIAGNOSIS — R109 Unspecified abdominal pain: Secondary | ICD-10-CM | POA: Diagnosis not present

## 2022-06-21 DIAGNOSIS — D649 Anemia, unspecified: Secondary | ICD-10-CM | POA: Diagnosis not present

## 2022-06-21 DIAGNOSIS — J9811 Atelectasis: Secondary | ICD-10-CM | POA: Diagnosis not present

## 2022-06-21 DIAGNOSIS — I25110 Atherosclerotic heart disease of native coronary artery with unstable angina pectoris: Secondary | ICD-10-CM | POA: Diagnosis not present

## 2022-06-21 DIAGNOSIS — C799 Secondary malignant neoplasm of unspecified site: Secondary | ICD-10-CM | POA: Diagnosis not present

## 2022-06-21 DIAGNOSIS — R404 Transient alteration of awareness: Secondary | ICD-10-CM | POA: Diagnosis not present

## 2022-06-21 DIAGNOSIS — R7989 Other specified abnormal findings of blood chemistry: Secondary | ICD-10-CM | POA: Diagnosis present

## 2022-06-21 DIAGNOSIS — I480 Paroxysmal atrial fibrillation: Secondary | ICD-10-CM | POA: Diagnosis present

## 2022-06-21 DIAGNOSIS — Z743 Need for continuous supervision: Secondary | ICD-10-CM | POA: Diagnosis not present

## 2022-06-21 DIAGNOSIS — I255 Ischemic cardiomyopathy: Secondary | ICD-10-CM | POA: Diagnosis not present

## 2022-06-21 DIAGNOSIS — R918 Other nonspecific abnormal finding of lung field: Secondary | ICD-10-CM | POA: Diagnosis not present

## 2022-06-21 HISTORY — DX: Hyperlipidemia, unspecified: E78.5

## 2022-06-21 HISTORY — DX: Atherosclerotic heart disease of native coronary artery without angina pectoris: I25.10

## 2022-06-21 HISTORY — DX: Essential (primary) hypertension: I10

## 2022-06-21 HISTORY — DX: Malignant neoplasm of prostate: C61

## 2022-06-21 LAB — LIPID PANEL
Cholesterol: 153 mg/dL (ref 0–200)
HDL: 39 mg/dL — ABNORMAL LOW (ref >40)
LDL Cholesterol: 102 mg/dL — ABNORMAL HIGH (ref 0–99)
Total CHOL/HDL Ratio: 3.9 RATIO
Triglycerides: 59 mg/dL (ref <150)
VLDL: 12 mg/dL (ref 0–40)

## 2022-06-21 LAB — CBC WITH DIFFERENTIAL/PLATELET
Abs Immature Granulocytes: 0.03 10*3/uL (ref 0.00–0.07)
Basophils Absolute: 0 10*3/uL (ref 0.0–0.1)
Basophils Relative: 1 %
Eosinophils Absolute: 0.2 10*3/uL (ref 0.0–0.5)
Eosinophils Relative: 2 %
HCT: 38.5 % — ABNORMAL LOW (ref 39.0–52.0)
Hemoglobin: 13 g/dL (ref 13.0–17.0)
Immature Granulocytes: 0 %
Lymphocytes Relative: 13 %
Lymphs Abs: 1.1 10*3/uL (ref 0.7–4.0)
MCH: 31.8 pg (ref 26.0–34.0)
MCHC: 33.8 g/dL (ref 30.0–36.0)
MCV: 94.1 fL (ref 80.0–100.0)
Monocytes Absolute: 0.5 10*3/uL (ref 0.1–1.0)
Monocytes Relative: 6 %
Neutro Abs: 6.3 10*3/uL (ref 1.7–7.7)
Neutrophils Relative %: 78 %
Platelets: 147 10*3/uL — ABNORMAL LOW (ref 150–400)
RBC: 4.09 MIL/uL — ABNORMAL LOW (ref 4.22–5.81)
RDW: 13.4 % (ref 11.5–15.5)
WBC: 8.1 10*3/uL (ref 4.0–10.5)
nRBC: 0 % (ref 0.0–0.2)

## 2022-06-21 LAB — COMPREHENSIVE METABOLIC PANEL
ALT: 12 U/L (ref 0–44)
AST: 21 U/L (ref 15–41)
Albumin: 3.8 g/dL (ref 3.5–5.0)
Alkaline Phosphatase: 57 U/L (ref 38–126)
Anion gap: 11 (ref 5–15)
BUN: 23 mg/dL (ref 8–23)
CO2: 22 mmol/L (ref 22–32)
Calcium: 9.2 mg/dL (ref 8.9–10.3)
Chloride: 108 mmol/L (ref 98–111)
Creatinine, Ser: 1.26 mg/dL — ABNORMAL HIGH (ref 0.61–1.24)
GFR, Estimated: 54 mL/min — ABNORMAL LOW (ref >60)
Glucose, Bld: 149 mg/dL — ABNORMAL HIGH (ref 70–99)
Potassium: 3.7 mmol/L (ref 3.5–5.1)
Sodium: 141 mmol/L (ref 135–145)
Total Bilirubin: 0.6 mg/dL (ref 0.3–1.2)
Total Protein: 5.9 g/dL — ABNORMAL LOW (ref 6.5–8.1)

## 2022-06-21 LAB — LIPASE, BLOOD: Lipase: 30 U/L (ref 11–51)

## 2022-06-21 LAB — TROPONIN I (HIGH SENSITIVITY)
Troponin I (High Sensitivity): 127 ng/L (ref <18)
Troponin I (High Sensitivity): 1525 ng/L (ref <18)

## 2022-06-21 LAB — HEPARIN LEVEL (UNFRACTIONATED): Heparin Unfractionated: >1.1 IU/mL — ABNORMAL HIGH (ref 0.30–0.70)

## 2022-06-21 LAB — MAGNESIUM
Magnesium: 2.1 mg/dL (ref 1.7–2.4)
Magnesium: 1.9 mg/dL (ref 1.7–2.4)

## 2022-06-21 LAB — TSH: TSH: 2.324 u[IU]/mL (ref 0.350–4.500)

## 2022-06-21 MED ORDER — ACETAMINOPHEN 325 MG PO TABS
650.0000 mg | ORAL_TABLET | Freq: Four times a day (QID) | ORAL | Status: DC | PRN
  Administered 2022-06-21 – 2022-06-23 (×3): 650 mg via ORAL
  Filled 2022-06-21 (×3): qty 2

## 2022-06-21 MED ORDER — HEPARIN BOLUS VIA INFUSION
3000.0000 [IU] | Freq: Once | INTRAVENOUS | Status: AC
  Administered 2022-06-21: 3000 [IU] via INTRAVENOUS
  Filled 2022-06-21: qty 3000

## 2022-06-21 MED ORDER — POTASSIUM CHLORIDE CRYS ER 20 MEQ PO TBCR: 30.0000 meq | EXTENDED_RELEASE_TABLET | ORAL | Status: AC

## 2022-06-21 MED ORDER — TRIMETHOBENZAMIDE HCL 100 MG/ML IM SOLN: 200.0000 mg | Freq: Four times a day (QID) | INTRAMUSCULAR | Status: DC | PRN

## 2022-06-21 MED ORDER — LINACLOTIDE 145 MCG PO CAPS: 145.0000 ug | ORAL_CAPSULE | Freq: Every day | ORAL | Status: DC | PRN

## 2022-06-21 MED ORDER — ONDANSETRON HCL 4 MG/2ML IJ SOLN
4.0000 mg | Freq: Once | INTRAMUSCULAR | Status: AC
Start: 2022-06-21 — End: 2022-06-21
  Administered 2022-06-21: 4 mg via INTRAVENOUS
  Filled 2022-06-21: qty 2

## 2022-06-21 MED ORDER — SERTRALINE HCL 100 MG PO TABS
100.0000 mg | ORAL_TABLET | Freq: Every day | ORAL | Status: DC
  Administered 2022-06-21 – 2022-06-25 (×5): 100 mg via ORAL
  Filled 2022-06-21 (×6): qty 1

## 2022-06-21 MED ORDER — ONDANSETRON HCL 4 MG/2ML IJ SOLN
4.0000 mg | Freq: Once | INTRAMUSCULAR | Status: AC
  Administered 2022-06-21: 4 mg via INTRAVENOUS
  Filled 2022-06-21: qty 2

## 2022-06-21 MED ORDER — SODIUM CHLORIDE 0.9% FLUSH: 3.0000 mL | Freq: Two times a day (BID) | INTRAVENOUS | Status: DC

## 2022-06-21 MED ORDER — ATROPINE SULFATE 1 MG/10ML IJ SOSY: 0.5000 mg | PREFILLED_SYRINGE | INTRAMUSCULAR | Status: DC | PRN

## 2022-06-21 MED ORDER — ASPIRIN 325 MG PO TABS
325.0000 mg | ORAL_TABLET | Freq: Every day | ORAL | Status: DC
Start: 2022-06-23 — End: 2022-06-23
  Administered 2022-06-23: 325 mg via ORAL
  Filled 2022-06-21: qty 1

## 2022-06-21 MED ORDER — LACTATED RINGERS IV BOLUS
1000.0000 mL | Freq: Once | INTRAVENOUS | Status: AC
  Administered 2022-06-21: 1000 mL via INTRAVENOUS

## 2022-06-21 MED ORDER — DILTIAZEM HCL-DEXTROSE 125-5 MG/125ML-% IV SOLN (PREMIX)
5.0000 mg/h | INTRAVENOUS | Status: DC
  Administered 2022-06-21: 5 mg/h via INTRAVENOUS
  Filled 2022-06-21: qty 125

## 2022-06-21 MED ORDER — HEPARIN (PORCINE) 25000 UT/250ML-% IV SOLN
900.0000 [IU]/h | INTRAVENOUS | Status: DC
  Administered 2022-06-21: 850 [IU]/h via INTRAVENOUS
  Administered 2022-06-21: 1000 [IU]/h via INTRAVENOUS
  Filled 2022-06-21 (×2): qty 250

## 2022-06-21 MED ORDER — ALBUTEROL SULFATE (2.5 MG/3ML) 0.083% IN NEBU: 2.5000 mg | INHALATION_SOLUTION | Freq: Four times a day (QID) | RESPIRATORY_TRACT | Status: DC | PRN

## 2022-06-21 MED ORDER — SODIUM CHLORIDE 0.9% FLUSH: 3.0000 mL | INTRAVENOUS | Status: DC | PRN

## 2022-06-21 MED ORDER — SODIUM CHLORIDE 0.9 % IV BOLUS
250.0000 mL | Freq: Once | INTRAVENOUS | Status: AC
Start: 2022-06-21 — End: 2022-06-21
  Administered 2022-06-21: 250 mL via INTRAVENOUS

## 2022-06-21 MED ORDER — SODIUM CHLORIDE 0.9 % WEIGHT BASED INFUSION
3.0000 mL/kg/h | INTRAVENOUS | Status: AC
  Administered 2022-06-22: 3 mL/kg/h via INTRAVENOUS

## 2022-06-21 MED ORDER — ACETAMINOPHEN 650 MG RE SUPP: 650.0000 mg | Freq: Four times a day (QID) | RECTAL | Status: DC | PRN

## 2022-06-21 MED ORDER — ZOLPIDEM TARTRATE 5 MG PO TABS
10.0000 mg | ORAL_TABLET | Freq: Every evening | ORAL | Status: DC | PRN
  Administered 2022-06-22 – 2022-06-24 (×4): 10 mg via ORAL
  Filled 2022-06-21 (×4): qty 2

## 2022-06-21 MED ORDER — METOPROLOL TARTRATE 12.5 MG HALF TABLET
12.5000 mg | ORAL_TABLET | Freq: Two times a day (BID) | ORAL | Status: DC
  Administered 2022-06-21 – 2022-06-25 (×7): 12.5 mg via ORAL
  Filled 2022-06-21 (×8): qty 1

## 2022-06-21 MED ORDER — SODIUM CHLORIDE 0.9 % WEIGHT BASED INFUSION
1.0000 mL/kg/h | INTRAVENOUS | Status: DC
  Administered 2022-06-22: 1 mL/kg/h via INTRAVENOUS

## 2022-06-21 MED ORDER — ASPIRIN 81 MG PO CHEW
81.0000 mg | CHEWABLE_TABLET | ORAL | Status: AC
  Administered 2022-06-22: 81 mg via ORAL
  Filled 2022-06-21: qty 1

## 2022-06-21 MED ORDER — SIMVASTATIN 20 MG PO TABS
20.0000 mg | ORAL_TABLET | Freq: Every day | ORAL | Status: DC
  Administered 2022-06-21 – 2022-06-23 (×3): 20 mg via ORAL
  Filled 2022-06-21 (×3): qty 1

## 2022-06-21 MED ORDER — SODIUM CHLORIDE 0.9% FLUSH
3.0000 mL | Freq: Two times a day (BID) | INTRAVENOUS | Status: DC
  Administered 2022-06-21: 3 mL via INTRAVENOUS

## 2022-06-21 MED ORDER — FENTANYL CITRATE PF 50 MCG/ML IJ SOSY
12.5000 ug | PREFILLED_SYRINGE | INTRAMUSCULAR | Status: DC | PRN
  Administered 2022-06-21 – 2022-06-23 (×2): 12.5 ug via INTRAVENOUS
  Filled 2022-06-21 (×3): qty 1

## 2022-06-21 MED ORDER — FENTANYL CITRATE PF 50 MCG/ML IJ SOSY
50.0000 ug | PREFILLED_SYRINGE | Freq: Once | INTRAMUSCULAR | Status: AC
  Administered 2022-06-21: 50 ug via INTRAVENOUS
  Filled 2022-06-21: qty 1

## 2022-06-21 MED ORDER — NITROGLYCERIN 0.4 MG SL SUBL: 0.4000 mg | SUBLINGUAL_TABLET | SUBLINGUAL | Status: DC | PRN

## 2022-06-21 MED ORDER — APALUTAMIDE 60 MG PO TABS
240.0000 mg | ORAL_TABLET | Freq: Every day | ORAL | Status: DC
  Administered 2022-06-23 – 2022-06-25 (×3): 240 mg via ORAL
  Filled 2022-06-21 (×5): qty 4

## 2022-06-21 MED ORDER — ASPIRIN 325 MG PO TABS
325.0000 mg | ORAL_TABLET | Freq: Every day | ORAL | Status: DC
  Administered 2022-06-21: 325 mg via ORAL
  Filled 2022-06-21: qty 1

## 2022-06-21 MED ORDER — SODIUM CHLORIDE 0.9 % IV SOLN: 250.0000 mL | INTRAVENOUS | Status: DC | PRN

## 2022-06-21 MED ORDER — FENTANYL CITRATE PF 50 MCG/ML IJ SOSY
50.0000 ug | PREFILLED_SYRINGE | Freq: Once | INTRAMUSCULAR | Status: DC
  Filled 2022-06-21: qty 1

## 2022-06-21 NOTE — ED Notes (Signed)
Report given to [facility] RN and pt moved to room 7\

## 2022-06-21 NOTE — Progress Notes (Signed)
ANTICOAGULATION CONSULT NOTE   Pharmacy Consult for Heparin  Indication: atrial fibrillation  Allergies  Allergen Reactions   Nsaids     Patient Measurements: Height: '5\' 8"'$  (172.7 cm) Weight: 68.5 kg (151 lb) IBW/kg (Calculated) : 68.4  Vital Signs: Temp: 97.4 F (36.3 C) (08/14 0858) Temp Source: Oral (08/14 0503) BP: 123/58 (08/14 1531) Pulse Rate: 59 (08/14 1531)  Labs: Recent Labs    06/21/22 0538 06/21/22 0756 06/21/22 1530  HGB 13.0  --   --   HCT 38.5*  --   --   PLT 147*  --   --   HEPARINUNFRC  --   --  >1.10*  CREATININE 1.26*  --   --   TROPONINIHS 127* 1,525*  --      Estimated Creatinine Clearance: 37.7 mL/min (A) (by C-G formula based on SCr of 1.26 mg/dL (H)).   Medical History: Past Medical History:  Diagnosis Date   CAD (coronary artery disease)    HLD (hyperlipidemia)    HTN (hypertension)    Prostate cancer [facility])       Assessment: 88 y/o M with chest pain, nausea, HA, dizziness. Found to be in [place]. Starting heparin. Hgb good, plts 147. PTA meds reviewed.   Heparin level supratherapeutic on 1000 units/hr, no infusion issues, correctly drawn lab per RN report  Goal of Therapy:  Heparin level 0.3-0.7 units/ml Monitor platelets by anticoagulation protocol: Yes   Plan:  Hold heparin gtt x 1h, restart at reduced rate of 850 units/hr F/u 8 hour heparin level F/u long term [facility] plan  Bertis Ruddy, PharmD Clinical Pharmacist ED Pharmacist Phone # [phone] 06/21/2022 4:22 PM

## 2022-06-21 NOTE — Progress Notes (Signed)
ANTICOAGULATION CONSULT NOTE - Initial Consult  Pharmacy Consult for Heparin  Indication: atrial fibrillation  Not on File  Patient Measurements: Height: '5\' 8"'$  (172.7 cm) Weight: 68.5 kg (151 lb) IBW/kg (Calculated) : 68.4  Vital Signs: Temp: 97.6 F (36.4 C) (08/14 0503) Temp Source: Oral (08/14 0503) BP: 123/80 (08/14 0615) Pulse Rate: 153 (08/14 0620)  Labs: Recent Labs    06/21/22 0538  HGB 13.0  HCT 38.5*  PLT 147*    CrCl cannot be calculated (No successful lab value found.).   Medical History: No past medical history on file.    Assessment: 88 y/o M with chest pain, nausea, HA, dizziness. Found to be in [place]. Starting heparin. Hgb good, plts 147. PTA meds reviewed.   Goal of Therapy:  Heparin level 0.3-0.7 units/ml Monitor platelets by anticoagulation protocol: Yes   Plan:  Heparin 3000 units BOLUS Start heparin drip at 1000 units/hr 1500 Heparin level Daily CBC/Heparin level Monitor for bleeding  Narda Bonds, PharmD, BCPS Clinical Pharmacist Phone: [phone]

## 2022-06-21 NOTE — Consult Note (Addendum)
Cardiology Consultation:   Patient ID: Randy RECUPERO Sr. MRN: [id number]; DOB: 08/17/1930  Admit date: 06/21/2022 Date of Consult: 06/21/2022  PCP:  [name], NP   [facility] Providers Cardiologist: New (remotely Dr. Tamala Julian)  Patient Profile:   Randy SUDER Sr. is a 88 y.o. male with a hx of HTN, HLD, CAD and prostate cancer s/p proctectomy who is being seen 06/21/2022 for the evaluation of Afib and elevated troponin at the request of Dr. Tamala Julian.  Patient reported remote history of myocardial infraction requiring stenting to "backside of her heart".  Last seen by Dr. Tamala Julian many years ago.  History of Present Illness:   Randy [name] was in usual state of health up until this morning around 3 AM he had lower sternal chest pain describing as pressure sensation.  Did not feel palpitation but something was wrong.  Also felt dizzy.  No shortness of breath, diaphoresis or syncope.  Took sublingual nitroglycerin x1 without minimal improvement.  EMS was called and found to be in atrial fibrillation with rapid ventricular rate.  Brought to ER for further evaluation.  Repeat EKG with atrial fibrillation with heart rate in 160.  Noted to have ST elevation in inferior leads with reciprocal ST depression in pericardial leads.  Case discussed with cardiology who felt possibly rate related ST elevation.  Started on IV Cardizem and IV heparin.  Subsequent EKG showed improved ST segment.  He converted to sinus rhythm around 7 AM and since maintaining sinus rhythm.  Last EKG showed sinus rhythm with T wave inversion in inferior lateral leads.  Currently hemodynamically stable.  Complaining of right lower chest pain, describing it as achy sensation.  Initially he had substernal chest pressure.  At baseline, he is very active doing yard work and walking.  He lives with wife.  He is a retired Airline pilot.  He was never told to have a weak heart or arrhythmia.  Hs-troponin 127>>1525 SCr  1.26 HGb 13   Past Medical History:  Diagnosis Date   CAD (coronary artery disease)    HLD (hyperlipidemia)    HTN (hypertension)    Prostate cancer (HCC)     Inpatient Medications: Scheduled Meds:  apalutamide  240 mg Oral Daily   aspirin  325 mg Oral Daily   potassium chloride  30 mEq Oral STAT   sertraline  100 mg Oral Daily   simvastatin  20 mg Oral q1800   sodium chloride flush  3 mL Intravenous Q12H   Continuous Infusions:  heparin 1,000 Units/hr (06/21/22 0905)   PRN Meds: acetaminophen **OR** acetaminophen, albuterol, atropine, fentaNYL (SUBLIMAZE) injection, linaclotide, nitroGLYCERIN, trimethobenzamide, zolpidem  Allergies:    Allergies  Allergen Reactions   Nsaids     Social History:   Social History   Socioeconomic History   Marital status: Married    Spouse name: Not on file   Number of children: Not on file   Years of education: Not on file   Highest education level: Not on file  Occupational History   Not on file  Tobacco Use   Smoking status: Never   Smokeless tobacco: Never  Substance and Sexual Activity   Alcohol use: Not Currently   Drug use: Never   Sexual activity: Not on file  Other Topics Concern   Not on file  Social History Narrative   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     Social Determinants of Health   Financial Resource Strain: Not on file  Food Insecurity:  Not on file  Transportation Needs: Not on file  Physical Activity: Not on file  Stress: Not on file  Social Connections: Not on file  Intimate Partner Violence: Not on file    Family History:   Family History  Problem Relation Age of Onset   Alzheimer's disease Mother    Bone cancer Father      ROS:  Please see the history of present illness.  All other ROS reviewed and negative.     Physical Exam/Data:   Vitals:   06/21/22 0920 06/21/22 1030 06/21/22 1228 06/21/22 1330  BP: (!) 92/52 109/69 (!) 100/59 120/66  Pulse: (!) 52 (!) 57 (!) 58 (!) 59  Resp: '16 19  15 17  '$ Temp:      TempSrc:      SpO2: 99% 100% 100% 99%  Weight:      Height:        Intake/Output Summary (Last 24 hours) at 06/21/2022 1529 Last data filed at 06/21/2022 0925 Gross per 24 hour  Intake 1171.47 ml  Output --  Net 1171.47 ml      06/21/2022    5:01 AM  Last 3 Weights  Weight (lbs) 151 lb  Weight (kg) 68.493 kg     Body mass index is 22.96 kg/m.  General:  Well nourished, well developed, in no acute distress HEENT: normal Neck: no JVD Vascular: No carotid bruits; Distal pulses 2+ bilaterally Cardiac:  normal S1, S2; RRR; no murmur  Lungs:  clear to auscultation bilaterally, no wheezing, rhonchi or rales  Abd: soft, nontender, no hepatomegaly  Ext: no edema Musculoskeletal:  No deformities, BUE and BLE strength normal and equal Skin: warm and dry  Neuro:  CNs 2-12 intact, no focal abnormalities noted Psych:  Normal affect   EKG:  The EKG was personally reviewed and demonstrates: Atrial fibrillation with rapid ventricular rate, ST elevation in inferior leads with reciprocal depression Telemetry:  Telemetry was personally reviewed and demonstrates: Sinus bradycardia/rhythm  Relevant CV Studies:  As above   Laboratory Data:  High Sensitivity Troponin:   Recent Labs  Lab 06/21/22 0538 06/21/22 0756  TROPONINIHS 127* 1,525*     Chemistry Recent Labs  Lab 06/21/22 0538 06/21/22 1230  NA 141  --   K 3.7  --   CL 108  --   CO2 22  --   GLUCOSE 149*  --   BUN 23  --   CREATININE 1.26*  --   CALCIUM 9.2  --   MG 2.1 1.9  GFRNONAA 54*  --   ANIONGAP 11  --     Recent Labs  Lab 06/21/22 0538  PROT 5.9*  ALBUMIN 3.8  AST 21  ALT 12  ALKPHOS 57  BILITOT 0.6   Hematology Recent Labs  Lab 06/21/22 0538  WBC 8.1  RBC 4.09*  HGB 13.0  HCT 38.5*  MCV 94.1  MCH 31.8  MCHC 33.8  RDW 13.4  PLT 147*    Radiology/Studies:  DG Abd Portable 1V  Result Date: 06/21/2022 CLINICAL DATA:  Chest pain and shortness of breath EXAM: PORTABLE  ABDOMEN - 1 VIEW COMPARISON:  Chest radiograph earlier today FINDINGS: Two views of the abdomen and pelvis. Gas within normal caliber stomach corresponds to the chest radiograph abnormality. Right hemidiaphragm elevation. No gross free intraperitoneal air. No gaseous distention of bowel loops. Pelvic node dissection. Cardiomegaly. Right hemidiaphragm elevation is mild. IMPRESSION: No acute findings. Electronically Signed   By: Adria Devon.D.  On: 06/21/2022 14:18   DG Chest Portable 1 View  Result Date: 06/21/2022 CLINICAL DATA:  88 year old male with chest pain. Atrial fibrillation. EXAM: PORTABLE CHEST 1 VIEW COMPARISON:  Chest CT 03/12/2015 and earlier. FINDINGS: Portable AP semi upright view at 0605 hours. Lower lung volumes. Chronic tortuosity and calcification of the thoracic aorta. Stable cardiac size and mediastinal contours. Visualized tracheal air column is within normal limits. Mild patchy opacity at the left lung base most resembles atelectasis. Elsewhere Allowing for portable technique the lungs are clear. No pneumothorax. Partially visible at least moderate gas distended bowel in the left upper quadrant which is probably the stomach. No acute osseous abnormality identified. IMPRESSION: 1. Lower lung volumes with mild left lung base atelectasis. 2. Partially visible moderately gas distended bowel in the left upper quadrant, probably the stomach. 3.  Aortic Atherosclerosis (ICD10-I70.0). Electronically Signed   By: Genevie Ann M.D.   On: 06/21/2022 06:13     Assessment and Plan:   Non-STEMI -Initially had substernal chest pressure which improved after sublingual nitroglycerin x1 but now resolved.  He was in A-fib RVR at that time.  Currently right-sided EKG transition. -Severely abnormal EKG while in A-fib RVR showing ST elevation in inferior leads with reciprocal depression in pericardial leads.  His segment improved on repeat EKG. -Last EKG showed sinus rhythm with T wave inversion in  inferior lateral leads -Hs-troponin 127>>1525 -Remote history of MI 20 years ago with stenting (no details available) -Continue IV heparin -Continue aspirin (changed to 81 mg daily) -Continue Zocor (will check lipid panel) -Pending echocardiogram  2.  Paroxysmal atrial fibrillation with rapid ventricular rate -Placed on IV diltiazem with conversion to sinus rhythm -Maintaining sinus rhythm at rate of 50 to 60s -Continue to monitor on telemetry and will decide need beta-blocker -Continue statin -Continue heparin for anticoagulation.  Switch to oral if no procedure planned  3.  Hypertension -Home metoprolol on hold currently -Off Cardizem   Risk Assessment/Risk Scores:   TIMI Risk Score for Unstable Angina or Non-ST Elevation MI:   The patient's TIMI risk score is 6, which indicates a 41% risk of all cause mortality, new or recurrent myocardial infarction or need for urgent revascularization in the next 14 days.    CHA2DS2-VASc Score = 4   This indicates a 4.8% annual risk of stroke. The patient's score is based upon: CHF History: 0 HTN History: 1 Diabetes History: 0 Stroke History: 0 Vascular Disease History: 1 Age Score: 2 Gender Score: 0     For questions or updates, please contact [facility] Please consult [email or web address] for contact info under    Signed, Minus Breeding, MD  06/21/2022 3:29 PM   History and all data above reviewed.  Patient examined.  I agree with the findings as above.  The patient has a past history of distant coronary artery intervention as described.  He does quite well.  His family says he is independent living with his wife.  He does a lot of household chores and works in the yard.   He denies any recent cardiovascular symptoms.   The patient denies any new symptoms such as chest discomfort, neck or arm discomfort. There has been no new shortness of breath, PND or orthopnea. There have been no reported palpitations, presyncope or syncope.   he  did have discomfort this morning.  This seems to be upper mid epigastric or right epigastric.  It was a discomfort and he got up and ate a cookie.  He then  noticed his heart rate was going very fast when he tried to palpate his pulse.  He did not have presyncope or syncope.  He just did not feel right.  There may have been some chest discomfort.  He actually took a sublingual nitroglycerin and may have gotten very lightheaded with this.  He does not think he passed out.  When he got to the emergency room he was noted to have atrial fibrillation with rapid rate with markedly abnormal EKG.  He does not have enzyme elevation.  He did have an elevation in his troponins.  The patient exam reveals COR: Regular rate and rhythm, no murmurs,  Lungs: Clear to auscultation bilaterally, no wheezing or crackle,  Abd: Positive bowel sounds normal frequency pitch, bruits, rebound, or guarding, Ext 2+ pulses, no edema, no cyanosis, no clubbing.  All available labs, radiology testing, previous records reviewed. Agree with documented assessment and plan.  Atrial fibrillation: We will continue on heparin.  I will start low-dose beta-blocker.  Further management will be based on the results of other testing.  Non-Q wave myocardial infarction: This is probably secondary to his age 53 with rapid rate but he had marked ST segment changes on his EKG so cardiac cath is indicated.  I will wait to start statin pending resolution of his GI problems Minus Breeding  3:29 PM  06/21/2022

## 2022-06-21 NOTE — H&P (Addendum)
History and Physical    Patient: Randy [name] ERX:[id number] DOB: [birth year] DOA: 06/21/2022 DOS: the patient was seen and examined on 06/21/2022 PCP: [name], NP  Patient coming from: Home via EMS  Chief Complaint:  Chief Complaint  Patient presents with   Chest Pain   Atrial Fibrillation   HPI: Randy LITLE Sr. is a 88 y.o. male with medical history significant of hypertension, hyperlipidemia, CAD s/p stent, metastatic prostate cancer s/p radical castration who presented with complaints of chest pain which started yesterday.  He reported having substernal chest pain with some complaints of shortness of breath.  He had taken 1 sublingual nitroglycerin at home prior to arrival.  Patient noted associated symptoms of palpitations, lightheadedness, headache, diaphoresis, nausea, and vomiting.  He also describes epigastric discomfort as well and notes that chest pain moved over to the right side of his chest.  He did not note any lower extremity edema.  The patient and family present at bedside note he has no prior history of having a irregular heart rhythm.  He denies being sedentary and ambulates without need of assistance.  On admission to the emergency department patient was seen in atrial fibrillation with heart rates elevated into the 160s.  Labs significant for platelets 147, BUN 23, creatinine 1.26, high-sensitivity troponin 127.  Initial EKG gave concern for ST elevations in leads II, 3, and aVF.  Chest x-ray noted low lung volumes with mid left lung base atelectasis and mildly gas distended bowel left upper quadrant thought to be stomach.  Case had been discussed with cardiology who recommended repeating echocardiogram after obtaining rate control.  Patient has been started on a heparin and Cardizem drip.  Repeat EKG noted some improvement.  Patient also received 1 L of IV fluids, antiemetics, and fentanyl for pain.     Review of Systems: As mentioned in the history of  present illness. All other systems reviewed and are negative.   Past Medical History:  Diagnosis Date   CAD (coronary artery disease)    HLD (hyperlipidemia)    HTN (hypertension)    Social History:  reports that he has never smoked. He has never used smokeless tobacco. He reports that he does not currently use alcohol. He reports that he does not use drugs.  Past Surgical History:  Procedure Laterality Date   CORONARY ANGIOPLASTY        11/30/94 (Dr. Daneen Schick): Mild ant/anteroapical hypokinesis (known ant MI '94), EF 60%. 50-60%pLAD, 60% RI, 50% oRCA, 90% OM1 (s/p PTCA '96)   KNEE ARTHROSCOPY Right 03/01/2018    Procedure: ARTHROSCOPY KNEE;  Surgeon: Frederik Pear, MD;  Location: [facility];  Service: Orthopedics;  Laterality: Right;  debridement of medial and lateral meniscal tears           Family History  Problem Relation Age of Onset   Bone cancer Father     Breast cancer Neg Hx     Colon cancer Neg Hx     Prostate cancer Neg Hx     Pancreatic cancer Neg Hx         Prior to Admission medications   Not on File    Physical Exam: Vitals:   06/21/22 0615 06/21/22 0620 06/21/22 0645 06/21/22 0700  BP: 123/80  (!) 137/110 (!) 109/59  Pulse:  (!) 153 (!) 101 (!) 58  Resp: (!) 21  (!) 21 (!) 23  Temp:      TempSrc:      SpO2: 100%  94% 100%  Weight:      Height:         Constitutional: Elderly male who appears chronically ill Eyes: PERRL, lids and conjunctivae normal ENMT: Mucous membranes are moist.  Hard of hearing. Neck: normal, supple,   Respiratory: clear to auscultation bilaterally, no wheezing, no crackles.  Patient on 2 L nasal cannula oxygen with O2 saturations maintained. Cardiovascular: Bradycardic with trace lower extremity edema.  Positive systolic murmur appreciated. Abdomen: no tenderness, no masses palpated.   Bowel sounds positive.  Musculoskeletal: no clubbing / cyanosis. No joint deformity upper and lower extremities. Good ROM, no contractures.  Normal muscle tone.  Skin: no rashes, lesions, ulcers. No induration Neurologic: CN 2-12 grossly intact. Strength 5/5 in all 4.  Psychiatric: Normal judgment and insight. Alert and oriented x 3. Normal mood.   Data Reviewed:  EKG revealed A-fib with RVR with heart rates 168 with ST elevations noted in leads 2, 3, and aVF.  Assessment and Plan: Atrial fibrillation with RVR bradycardia Patient was found to be in A-fib with RVR with heart rates elevated into the 160s.  He had nitially been placed on a Cardizem   drip with improvement in blood pressures and heart rates.  However, patient was noted to become bradycardic with soft blood pressures.  At the time of my exam patient was noted to be in a sinus rhythm. CHA2DS2-VASc score = 4(based off age, HTN, and prior MI).  The patient had also been started on a heparin drip. -Admit to a progressive bed -Discontinue Cardizem drip -Continue heparin drip per pharmacy -Goal potassium at least 4 and magnesium at least 2.  Will replace as needed. -Check TSH -Check echocardiogram -Atropine 0.5 mg IV as needed for symptomatic bradycardia -Cardiology consulted, will follow-up for any further recommendations  Chest pain elevated troponin history of CAD Acute.  Patient reported initial complaints of chest pain.  High-sensitivity troponin 127.  EKG noted initial ST elevation in leads II, III, and aVF.  Repeat troponin 1525.  He has prior history of coronary artery disease requiring stent placement over 10 years ago. -Continue heparin drip -Continue aspirin -Continue to trend cardiac troponins -Follow-up echocardiogram  -Per cardiology  Nausea and vomiting Acute.  Chest x-ray noted this tended bowel of the left upper quadrant thought to be stomach. -Check abdominal x-ray -Tigan IM as needed for nausea and vomiting due to prolonged QT interval  Transient hypotension Acute.  Blood pressures noted to drop as low as 98/46.  Thought possibly secondary to  Cardizem which had been discontinued.  Patient had been given 1 L lactated Ringer's while in the ED.  Home medication regimen includes metoprolol tartrate 50 mg twice daily. -Hold metoprolol -Bolus 250 mL of IV fluids x1 dose -Goal MAP 65  Renal insufficiency Creatinine 1.26 BUN 23.  Patient's baseline creatinine previously noted to be around 1.1-1.2.  Suspect mild renal insufficiency secondary to decreased perfusion in the setting of A-fib with RVR. -Continue to monitor kidney function  Prolonged QT interval QTc noted to be 508 on his most recent EKG obtained. -Report QT prolonging medications  Thrombocytopenia Chronic.  Platelet count 147 on admission.  Patient did not report any complaints of bleeding. -Continue to monitor  Hyperlipidemia Home medication regimen includes simvastatin 20 mg nightly. -Check lipid panel -Continue statin  Metastatic prostate cancer to multiple sites Patient is followed in outpatient setting by Dr. Alen Blew.  Prior history of radical castration, but had been noted to have spread to lymph nodes  and bone.Marland Kitchen  He continues to be on apalutamide for which patient has been noted to have declining PSA. -Continue current medication regimen as tolerated -Continue outpatient follow-up with Dr. Alen Blew  DVT prophylaxis: Heparin Advance Care Planning:   Code Status: Full Code   Consults: Cardiology  Family Communication: Son updated at bedside  Severity of Illness: The appropriate patient status for this patient is OBSERVATION. Observation status is judged to be reasonable and necessary in order to provide the required intensity of service to ensure the patient's safety. The patient's presenting symptoms, physical exam findings, and initial radiographic and laboratory data in the context of their medical condition is felt to place them at decreased risk for further clinical deterioration. Furthermore, it is anticipated that the patient will be medically stable for  discharge from the hospital within 2 midnights of admission.   Author: Norval Morton, MD 06/21/2022 8:35 AM  For on call review [email or web address].

## 2022-06-21 NOTE — ED Notes (Signed)
Admitting paged to RN per her request 

## 2022-06-21 NOTE — Plan of Care (Signed)
  Problem: Education: Goal: Knowledge of General Education information will improve Description: Including pain rating scale, medication(s)/side effects and non-pharmacologic comfort measures Outcome: Progressing   Problem: Health Behavior/Discharge Planning: Goal: Ability to manage health-related needs will improve Outcome: Progressing   Problem: Clinical Measurements: Goal: Cardiovascular complication will be avoided Outcome: Progressing   Problem: Activity: Goal: Risk for activity intolerance will decrease Outcome: Progressing   Problem: Pain Managment: Goal: General experience of comfort will improve Outcome: Progressing   Problem: Safety: Goal: Ability to remain free from injury will improve Outcome: Progressing   Problem: Skin Integrity: Goal: Risk for impaired skin integrity will decrease Outcome: Progressing   

## 2022-06-21 NOTE — ED Provider Notes (Signed)
[facility] EMERGENCY DEPARTMENT Provider Note   CSN: [id number] Arrival date & time: 06/21/22  0450     History  Chief Complaint  Patient presents with   Chest Pain   Atrial Fibrillation    Randy SEAR Sr. is a 88 y.o. male.  88 year old male with history of coronary artery disease status post stent 15 years ago the presents the ER today with chest pain, shortness of breath and lightheadedness.  Patient states that he started feeling short of breath with some retrosternal chest pain and shortness of breath yesterday sometime.  Patient states that he started having some palpitations after that.  Tried nitro which did not help so he called EMS.  With EMS he was in A-fib RVR.  Brought here for further evaluation.  Patient still symptomatic at this time but states the pain seems to have moved to the right chest.  No lower extremity swelling.  Patient initially said he was on a blood thinner however once the records are emersion does not appear that is the case.  He seems to be unclear on his medications.  He also stated he thought he had a history of irregular heartbeat but I do not see resurfacing a cardiologist.  He does have a history of prostate cancer currently getting treatment.   Chest Pain Atrial Fibrillation Associated symptoms include chest pain.       Home Medications Prior to Admission medications   Not on File      Allergies    Patient has no allergy information on record.    Review of Systems   Review of Systems  Cardiovascular:  Positive for chest pain.    Physical Exam Updated Vital Signs BP (!) 137/110   Pulse (!) 101   Temp 97.6 F (36.4 C) (Oral)   Resp (!) 21   Ht '5\' 8"'$  (1.727 m)   Wt 68.5 kg   SpO2 94%   BMI 22.96 kg/m  Physical Exam Vitals and nursing note reviewed.  Constitutional:      Appearance: He is well-developed.  HENT:     Head: Normocephalic and atraumatic.  Cardiovascular:     Rate and Rhythm: Tachycardia  present. Rhythm irregular.  Pulmonary:     Effort: Pulmonary effort is normal. No respiratory distress.  Abdominal:     General: There is no distension.  Musculoskeletal:        General: Normal range of motion.     Cervical back: Normal range of motion.     Right lower leg: No edema.     Left lower leg: No edema.  Neurological:     Mental Status: He is alert.     ED Results / Procedures / Treatments   Labs (all labs ordered are listed, but only abnormal results are displayed) Labs Reviewed  CBC WITH DIFFERENTIAL/PLATELET - Abnormal; Notable for the following components:      Result Value   RBC 4.09 (*)    HCT 38.5 (*)    Platelets 147 (*)    All other components within normal limits  COMPREHENSIVE METABOLIC PANEL - Abnormal; Notable for the following components:   Glucose, Bld 149 (*)    Creatinine, Ser 1.26 (*)    Total Protein 5.9 (*)    GFR, Estimated 54 (*)    All other components within normal limits  TROPONIN I (HIGH SENSITIVITY) - Abnormal; Notable for the following components:   Troponin I (High Sensitivity) 127 (*)    All other  components within normal limits  LIPASE, BLOOD  MAGNESIUM  HEPARIN LEVEL (UNFRACTIONATED)  TROPONIN I (HIGH SENSITIVITY)    EKG EKG Interpretation  Date/Time:  Monday June 21 2022 05:01:42 EDT Ventricular Rate:  168 PR Interval:    QRS Duration: 104 QT Interval:  265 QTC Calculation: 443 R Axis:   73 Text Interpretation: Atrial fibrillation with rapid V-rate Inferoposterior infarct, acute (RCA) Lateral leads are also involved Probable RV involvement, suggest recording right precordial leads significant ST elevation in II, III, aVF with depression in I, aVL and V2-3, less elevation in lateral leads. possibly rate related. Confirmed by Merrily Pew [phone]) on 06/21/2022 6:22:52 AM   EKG Interpretation  Date/Time:  Monday June 21 2022 05:01:42 EDT Ventricular Rate:  168 PR Interval:    QRS Duration: 104 QT Interval:  265 QTC  Calculation: 443 R Axis:   73 Text Interpretation: Atrial fibrillation with rapid V-rate Inferoposterior infarct, acute (RCA) Lateral leads are also involved Probable RV involvement, suggest recording right precordial leads significant ST elevation in II, III, aVF with depression in I, aVL and V2-3, less elevation in lateral leads. possibly rate related. Confirmed by Merrily Pew [phone]) on 06/21/2022 6:22:52 AM         Radiology DG Chest Portable 1 View  Result Date: 06/21/2022 CLINICAL DATA:  88 year old male with chest pain. Atrial fibrillation. EXAM: PORTABLE CHEST 1 VIEW COMPARISON:  Chest CT 03/12/2015 and earlier. FINDINGS: Portable AP semi upright view at 0605 hours. Lower lung volumes. Chronic tortuosity and calcification of the thoracic aorta. Stable cardiac size and mediastinal contours. Visualized tracheal air column is within normal limits. Mild patchy opacity at the left lung base most resembles atelectasis. Elsewhere Allowing for portable technique the lungs are clear. No pneumothorax. Partially visible at least moderate gas distended bowel in the left upper quadrant which is probably the stomach. No acute osseous abnormality identified. IMPRESSION: 1. Lower lung volumes with mild left lung base atelectasis. 2. Partially visible moderately gas distended bowel in the left upper quadrant, probably the stomach. 3.  Aortic Atherosclerosis (ICD10-I70.0). Electronically Signed   By: Genevie Ann M.D.   On: 06/21/2022 06:13    Procedures .Critical Care  Performed by: Merrily Pew, MD Authorized by: Merrily Pew, MD   Critical care provider statement:    Critical care time (minutes):  30   Critical care was necessary to treat or prevent imminent or life-threatening deterioration of the following conditions:  Cardiac failure and circulatory failure   Critical care was time spent personally by me on the following activities:  Development of treatment plan with patient or surrogate,  discussions with consultants, evaluation of patient's response to treatment, examination of patient, ordering and review of laboratory studies, ordering and review of radiographic studies, ordering and performing treatments and interventions, pulse oximetry, re-evaluation of patient's condition and review of old charts     Medications Ordered in ED Medications  diltiazem (CARDIZEM) 125 mg in dextrose 5% 125 mL (1 mg/mL) infusion (5 mg/hr Intravenous New Bag/Given 06/21/22 0545)  heparin ADULT infusion 100 units/mL (25000 units/265m) (1,000 Units/hr Intravenous New Bag/Given 06/21/22 0705)  lactated ringers bolus 1,000 mL (1,000 mLs Intravenous New Bag/Given 06/21/22 0553)  fentaNYL (SUBLIMAZE) injection 50 mcg (50 mcg Intravenous Given 06/21/22 0559)  ondansetron (ZOFRAN) injection 4 mg (4 mg Intravenous Given 06/21/22 0611)  heparin bolus via infusion 3,000 Units (3,000 Units Intravenous Bolus from Bag 06/21/22 0705)    ED Course/ Medical Decision Making/ A&P  Medical Decision Making Amount and/or Complexity of Data Reviewed Labs: ordered. Radiology: ordered.  Risk Prescription drug management. Decision regarding hospitalization.   Patient with significant ST elevations in inferior leads with reciprocal changes on his initial EKG however his heart rate was significantly elevated.  I discussed this with cardiology, Dr. Yancey Flemings, who agreed that it looked worrisome for possible partial occlusion but with the rate as fast as it was it was tough to tell I recommend checking a second 1.  The second EKG done little while later still shows some minor ST elevation in lead III but lead II and aVF it improved also leads V5 and V6 that improved and all of the reciprocal changes also had improved.  Very well could be a partial blockage that was just showing that because of the rapid rate.  Diltiazem was started heparin was started for the A-fib but also should cover if he is having  NSTEMI.  Patient's heart rate improved to the low 100s and his blood pressure improved as well.  Discussed with hospitalist for progressive bed admission.  Final Clinical Impression(s) / ED Diagnoses Final diagnoses:  Atrial fibrillation with RVR [facility])    Rx / DC Orders ED Discharge Orders     None         Shandy Checo, Corene Cornea, MD 06/21/22 [phone]

## 2022-06-21 NOTE — ED Triage Notes (Signed)
Pt BIB [facility] from c/o [facility], pt stated he took one SL nitro at appx 0345. Pt also experiencing headache, nausea, and dizziness. Per EMS HR 130-160s. AO4

## 2022-06-21 NOTE — ED Notes (Signed)
STOPPED CARDIZEM. Vs OUTSIDE PARAMETERS. AWAITING MD CALL BACK

## 2022-06-21 NOTE — ED Notes (Signed)
Heparin to be held one hour per pharmacy.

## 2022-06-21 NOTE — ED Notes (Signed)
Notified by secretary while this nurse in other room of oxygen sat low at 77%. This nurse went into pt room and waveform good, oxygen in 80's. Pt recently received IV fentanyl. Pt's oxygen was off. This nurse replaced oxygen and turn o2 on to 4LPM, titrated down to 2LPM when sats normalized

## 2022-06-22 ENCOUNTER — Telehealth (HOSPITAL_COMMUNITY): Payer: Self-pay | Admitting: Pharmacy Technician

## 2022-06-22 ENCOUNTER — Other Ambulatory Visit (HOSPITAL_COMMUNITY): Payer: Self-pay

## 2022-06-22 ENCOUNTER — Observation Stay (HOSPITAL_COMMUNITY): Payer: Medicare Other

## 2022-06-22 ENCOUNTER — Encounter (HOSPITAL_COMMUNITY)
Admission: EM | Disposition: A | Discharge status: Home / Self Care | Payer: Self-pay | Source: Home / Self Care | Attending: Internal Medicine

## 2022-06-22 DIAGNOSIS — R079 Chest pain, unspecified: Secondary | ICD-10-CM

## 2022-06-22 DIAGNOSIS — I25119 Atherosclerotic heart disease of native coronary artery with unspecified angina pectoris: Secondary | ICD-10-CM

## 2022-06-22 DIAGNOSIS — I4891 Unspecified atrial fibrillation: Secondary | ICD-10-CM

## 2022-06-22 DIAGNOSIS — R778 Other specified abnormalities of plasma proteins: Secondary | ICD-10-CM | POA: Diagnosis not present

## 2022-06-22 DIAGNOSIS — I214 Non-ST elevation (NSTEMI) myocardial infarction: Secondary | ICD-10-CM | POA: Diagnosis not present

## 2022-06-22 DIAGNOSIS — Z8679 Personal history of other diseases of the circulatory system: Secondary | ICD-10-CM | POA: Diagnosis not present

## 2022-06-22 DIAGNOSIS — C61 Malignant neoplasm of prostate: Secondary | ICD-10-CM | POA: Diagnosis not present

## 2022-06-22 HISTORY — PX: LEFT HEART CATH AND CORONARY ANGIOGRAPHY: CATH118249

## 2022-06-22 LAB — ECHOCARDIOGRAM COMPLETE
Area-P 1/2: 3.12 cm2
Calc EF: 48.2 %
Height: 68 in
MV M vel: 5.26 m/s
MV Peak grad: 110.7 mmHg
P 1/2 time: 482 msec
Radius: 0.3 cm
S' Lateral: 3.6 cm
Single Plane A2C EF: 48.9 %
Single Plane A4C EF: 53.1 %
Weight: 2370.39 oz

## 2022-06-22 LAB — CBC
HCT: 32.2 % — ABNORMAL LOW (ref 39.0–52.0)
Hemoglobin: 11.2 g/dL — ABNORMAL LOW (ref 13.0–17.0)
MCH: 32.3 pg (ref 26.0–34.0)
MCHC: 34.8 g/dL (ref 30.0–36.0)
MCV: 92.8 fL (ref 80.0–100.0)
Platelets: 104 10*3/uL — ABNORMAL LOW (ref 150–400)
RBC: 3.47 MIL/uL — ABNORMAL LOW (ref 4.22–5.81)
RDW: 13.6 % (ref 11.5–15.5)
WBC: 4.6 10*3/uL (ref 4.0–10.5)
nRBC: 0 % (ref 0.0–0.2)

## 2022-06-22 LAB — BASIC METABOLIC PANEL
Anion gap: 6 (ref 5–15)
BUN: 16 mg/dL (ref 8–23)
CO2: 26 mmol/L (ref 22–32)
Calcium: 8.7 mg/dL — ABNORMAL LOW (ref 8.9–10.3)
Chloride: 109 mmol/L (ref 98–111)
Creatinine, Ser: 1.12 mg/dL (ref 0.61–1.24)
GFR, Estimated: >60 mL/min (ref >60)
Glucose, Bld: 117 mg/dL — ABNORMAL HIGH (ref 70–99)
Potassium: 4.2 mmol/L (ref 3.5–5.1)
Sodium: 141 mmol/L (ref 135–145)

## 2022-06-22 LAB — HEPARIN LEVEL (UNFRACTIONATED)
Heparin Unfractionated: 0.27 IU/mL — ABNORMAL LOW (ref 0.30–0.70)
Heparin Unfractionated: 0.29 IU/mL — ABNORMAL LOW (ref 0.30–0.70)

## 2022-06-22 SURGERY — LEFT HEART CATH AND CORONARY ANGIOGRAPHY
Anesthesia: LOCAL

## 2022-06-22 MED ORDER — VERAPAMIL HCL 2.5 MG/ML IV SOLN
INTRAVENOUS | Status: AC
  Filled 2022-06-22: qty 2

## 2022-06-22 MED ORDER — HEPARIN SODIUM (PORCINE) 1000 UNIT/ML IJ SOLN
INTRAMUSCULAR | Status: DC | PRN
  Administered 2022-06-22: 3500 [IU] via INTRAVENOUS

## 2022-06-22 MED ORDER — FENTANYL CITRATE (PF) 100 MCG/2ML IJ SOLN
INTRAMUSCULAR | Status: DC | PRN
  Administered 2022-06-22: 25 ug via INTRAVENOUS

## 2022-06-22 MED ORDER — MIDAZOLAM HCL 2 MG/2ML IJ SOLN
INTRAMUSCULAR | Status: AC
  Filled 2022-06-22: qty 2

## 2022-06-22 MED ORDER — HEPARIN (PORCINE) IN NACL 1000-0.9 UT/500ML-% IV SOLN
INTRAVENOUS | Status: DC | PRN
  Administered 2022-06-22 (×2): 500 mL

## 2022-06-22 MED ORDER — IOHEXOL 350 MG/ML SOLN
INTRAVENOUS | Status: DC | PRN
  Administered 2022-06-22: 50 mL

## 2022-06-22 MED ORDER — LIDOCAINE HCL (PF) 1 % IJ SOLN
INTRAMUSCULAR | Status: AC
  Filled 2022-06-22: qty 30

## 2022-06-22 MED ORDER — HEPARIN (PORCINE) 25000 UT/250ML-% IV SOLN
900.0000 [IU]/h | INTRAVENOUS | Status: DC
  Administered 2022-06-23: 900 [IU]/h via INTRAVENOUS
  Filled 2022-06-22: qty 250

## 2022-06-22 MED ORDER — FENTANYL CITRATE (PF) 100 MCG/2ML IJ SOLN
INTRAMUSCULAR | Status: AC
  Filled 2022-06-22: qty 2

## 2022-06-22 MED ORDER — VERAPAMIL HCL 2.5 MG/ML IV SOLN
INTRAVENOUS | Status: DC | PRN
  Administered 2022-06-22: 10 mL via INTRA_ARTERIAL

## 2022-06-22 MED ORDER — ONDANSETRON HCL 4 MG/2ML IJ SOLN
4.0000 mg | Freq: Four times a day (QID) | INTRAMUSCULAR | Status: DC | PRN
Start: 2022-06-22 — End: 2022-06-25

## 2022-06-22 MED ORDER — LIDOCAINE HCL (PF) 1 % IJ SOLN
INTRAMUSCULAR | Status: DC | PRN
  Administered 2022-06-22: 2 mL

## 2022-06-22 MED ORDER — PERFLUTREN LIPID MICROSPHERE
1.0000 mL | INTRAVENOUS | Status: AC | PRN
  Administered 2022-06-22: 2 mL via INTRAVENOUS

## 2022-06-22 MED ORDER — MIDAZOLAM HCL 2 MG/2ML IJ SOLN
INTRAMUSCULAR | Status: DC | PRN
  Administered 2022-06-22: 1 mg via INTRAVENOUS

## 2022-06-22 MED ORDER — HEPARIN SODIUM (PORCINE) 1000 UNIT/ML IJ SOLN
INTRAMUSCULAR | Status: AC
  Filled 2022-06-22: qty 10

## 2022-06-22 MED ORDER — HEPARIN (PORCINE) IN NACL 1000-0.9 UT/500ML-% IV SOLN
INTRAVENOUS | Status: AC
  Filled 2022-06-22: qty 1000

## 2022-06-22 SURGICAL SUPPLY — 12 items
BAND CMPR LRG ZPHR (HEMOSTASIS) ×1
BAND ZEPHYR COMPRESS 30 LONG (HEMOSTASIS) ×1 IMPLANT
CATH OPTITORQUE TIG 4.0 5F (CATHETERS) ×1 IMPLANT
GLIDESHEATH SLEND SS 6F .021 (SHEATH) ×1 IMPLANT
GUIDEWIRE INQWIRE 1.5J.035X260 (WIRE) IMPLANT
INQWIRE 1.5J .035X260CM (WIRE) ×2
KIT HEART LEFT (KITS) ×3 IMPLANT
PACK CARDIAC CATHETERIZATION (CUSTOM PROCEDURE TRAY) ×3 IMPLANT
SYR MEDRAD MARK 7 150ML (SYRINGE) ×3 IMPLANT
TRANSDUCER W/STOPCOCK (MISCELLANEOUS) ×3 IMPLANT
TUBING CIL FLEX 10 FLL-RA (TUBING) ×3 IMPLANT
WIRE HI TORQ VERSACORE-J 145CM (WIRE) ×1 IMPLANT

## 2022-06-22 NOTE — Progress Notes (Signed)
  Echocardiogram 2D Echocardiogram has been performed.  Randy [name] 06/22/2022, 9:49 AM

## 2022-06-22 NOTE — Progress Notes (Signed)
ANTICOAGULATION CONSULT NOTE   Pharmacy Consult for Heparin  Indication: atrial fibrillation  Allergies  Allergen Reactions   Nsaids     Patient Measurements: Height: '5\' 8"'$  (172.7 cm) Weight: 68.5 kg (151 lb) IBW/kg (Calculated) : 68.4  Vital Signs: Temp: 97.8 F (36.6 C) (08/15 0003) Temp Source: Oral (08/15 0003) BP: 104/78 (08/15 0003) Pulse Rate: 60 (08/15 0003)  Labs: Recent Labs    06/21/22 0538 06/21/22 0756 06/21/22 1530 06/22/22 0319  HGB 13.0  --   --  11.2*  HCT 38.5*  --   --  32.2*  PLT 147*  --   --  104*  HEPARINUNFRC  --   --  >1.10* 0.27*  CREATININE 1.26*  --   --   --   TROPONINIHS 127* 1,525*  --   --      Estimated Creatinine Clearance: 37.7 mL/min (A) (by C-G formula based on SCr of 1.26 mg/dL (H)).   Medical History: Past Medical History:  Diagnosis Date   CAD (coronary artery disease)    HLD (hyperlipidemia)    HTN (hypertension)    Prostate cancer [facility])       Assessment: 88 y/o M with chest pain, nausea, HA, dizziness. Found to be in [place]. Starting heparin. Hgb good, plts 147. PTA meds reviewed.   8/15 AM update:  Heparin level low  Goal of Therapy:  Heparin level 0.3-0.7 units/ml Monitor platelets by anticoagulation protocol: Yes   Plan:  Inc heparin to 900 units/hr 1200 Heparin level Daily CBC/Heparin level Monitor for bleeding  Narda Bonds, PharmD, BCPS Clinical Pharmacist Phone: [phone]

## 2022-06-22 NOTE — Care Management (Signed)
  Transition of Care [facility]) Screening Note   Patient Details  Name: AMOGH KOMATSU Sr. Date of Birth: Apr 12, 1930   Transition of Care [facility]) CM/SW Contact:    Bethena Roys, RN Phone Number: 06/22/2022, 11:15 AM    Transition of Care Department [facility]) has reviewed the patient and no TOC needs have been identified at this time. We will continue to monitor patient advancement through interdisciplinary progression rounds. If new patient transition needs arise, please place a TOC consult.

## 2022-06-22 NOTE — Progress Notes (Signed)
Spoke to patient's wife to bring the [place] at the hospital.

## 2022-06-22 NOTE — H&P (View-Only) (Signed)
Progress Note  Patient Name: Randy BUSHNELL Sr. Date of Encounter: 06/22/2022  [facility] Cardiologist: Sanda Klein, MD   Subjective   Patient doing well this AM. Denies chest pain, palpitations, dizziness, Sob.   Inpatient Medications    Scheduled Meds:  apalutamide  240 mg Oral Daily   [START ON 06/23/2022] aspirin  325 mg Oral Daily   metoprolol tartrate  12.5 mg Oral BID   potassium chloride  30 mEq Oral STAT   sertraline  100 mg Oral Daily   simvastatin  20 mg Oral q1800   Continuous Infusions:  sodium chloride 1 mL/kg/hr (06/22/22 0551)   heparin 900 Units/hr (06/22/22 0600)   PRN Meds: acetaminophen **OR** acetaminophen, albuterol, atropine, fentaNYL (SUBLIMAZE) injection, linaclotide, nitroGLYCERIN, trimethobenzamide, zolpidem   Vital Signs    Vitals:   06/21/22 1940 06/22/22 0003 06/22/22 0400 06/22/22 0500  BP: 112/60 104/78 (!) 94/50   Pulse: 66 60 (!) 58   Resp: '19 19 18   '$ Temp: 99 F (37.2 C) 97.8 F (36.6 C) 98.1 F (36.7 C)   TempSrc: Oral Oral Oral   SpO2: 94% 96% 94%   Weight:    67.2 kg  Height:        Intake/Output Summary (Last 24 hours) at 06/22/2022 0815 Last data filed at 06/22/2022 0600 Gross per 24 hour  Intake 1620.47 ml  Output 400 ml  Net 1220.47 ml      06/22/2022    5:00 AM 06/21/2022    5:01 AM  Last 3 Weights  Weight (lbs) 148 lb 2.4 oz 151 lb  Weight (kg) 67.2 kg 68.493 kg      Telemetry    Sinus rhythm, HR in the 50s-60s - Personally Reviewed  ECG    No new tracings - Personally Reviewed  Physical Exam   GEN: No acute distress.  Laying flat on his back  Neck: No JVD Cardiac: RRR, no murmurs, rubs, or gallops. Radial pulses 2+ bilaterally Respiratory: Clear to auscultation bilaterally. GI: Soft, nontender, non-distended  MS: No edema; No deformity. Neuro:  Nonfocal  Psych: Normal affect   Labs    High Sensitivity Troponin:   Recent Labs  Lab 06/21/22 0538 06/21/22 0756  TROPONINIHS 127*  1,525*     Chemistry Recent Labs  Lab 06/21/22 0538 06/21/22 1230 06/22/22 0319  NA 141  --  141  K 3.7  --  4.2  CL 108  --  109  CO2 22  --  26  GLUCOSE 149*  --  117*  BUN 23  --  16  CREATININE 1.26*  --  1.12  CALCIUM 9.2  --  8.7*  MG 2.1 1.9  --   PROT 5.9*  --   --   ALBUMIN 3.8  --   --   AST 21  --   --   ALT 12  --   --   ALKPHOS 57  --   --   BILITOT 0.6  --   --   GFRNONAA 54*  --  >60  ANIONGAP 11  --  6    Lipids  Recent Labs  Lab 06/21/22 1230  CHOL 153  TRIG 59  HDL 39*  LDLCALC 102*  CHOLHDL 3.9    Hematology Recent Labs  Lab 06/21/22 0538 06/22/22 0319  WBC 8.1 4.6  RBC 4.09* 3.47*  HGB 13.0 11.2*  HCT 38.5* 32.2*  MCV 94.1 92.8  MCH 31.8 32.3  MCHC 33.8 34.8  RDW 13.4  13.6  PLT 147* 104*   Thyroid  Recent Labs  Lab 06/21/22 1230  TSH 2.324    BNPNo results for input(s): "BNP", "PROBNP" in the last 168 hours.  DDimer No results for input(s): "DDIMER" in the last 168 hours.   Radiology    DG Abd Portable 1V  Result Date: 06/21/2022 CLINICAL DATA:  Chest pain and shortness of breath EXAM: PORTABLE ABDOMEN - 1 VIEW COMPARISON:  Chest radiograph earlier today FINDINGS: Two views of the abdomen and pelvis. Gas within normal caliber stomach corresponds to the chest radiograph abnormality. Right hemidiaphragm elevation. No gross free intraperitoneal air. No gaseous distention of bowel loops. Pelvic node dissection. Cardiomegaly. Right hemidiaphragm elevation is mild. IMPRESSION: No acute findings. Electronically Signed   By: Abigail Miyamoto M.D.   On: 06/21/2022 14:18   DG Chest Portable 1 View  Result Date: 06/21/2022 CLINICAL DATA:  88 year old male with chest pain. Atrial fibrillation. EXAM: PORTABLE CHEST 1 VIEW COMPARISON:  Chest CT 03/12/2015 and earlier. FINDINGS: Portable AP semi upright view at 0605 hours. Lower lung volumes. Chronic tortuosity and calcification of the thoracic aorta. Stable cardiac size and mediastinal contours.  Visualized tracheal air column is within normal limits. Mild patchy opacity at the left lung base most resembles atelectasis. Elsewhere Allowing for portable technique the lungs are clear. No pneumothorax. Partially visible at least moderate gas distended bowel in the left upper quadrant which is probably the stomach. No acute osseous abnormality identified. IMPRESSION: 1. Lower lung volumes with mild left lung base atelectasis. 2. Partially visible moderately gas distended bowel in the left upper quadrant, probably the stomach. 3.  Aortic Atherosclerosis (ICD10-I70.0). Electronically Signed   By: Genevie Ann M.D.   On: 06/21/2022 06:13    Cardiac Studies     Patient Profile     88 y.o. male with a past medical history of HTN, HLD, CAD, prostate cancer s/p proctectomy who is being seen for the evaluation of atrial fibrillation, elevated troponin   Assessment & Plan    NSTEMI - Patient presented complaining of substernal chest pressure, improved with SL nitro. Found to be in afib with RVR at the time. 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 Improved on repeat EKG - hsTn 127>>1525 - Now on IV heparin - Scheduled to undergo cath today. Patient NPO. Currently chest pain free  - Continue daily ASA, zocar - On metoprolol 12.5 mg BID, BP and HR tolerating  - Echo pending   Paroxysmal Atrial Fibrillation  - Patient presented in Afib with RVR, was started on IV diltiazem, converted to NSR. Dilt drip has since been discontinued. - Per telemetry, patient is maintaining sinus rhythm with HR in the 50s-60s - Continue metoprolol 12.5 mg BID  - On IV heparin for cath today  HTN  - Continue metoprolol 12.5 mg BID - BP stable   For questions or updates, please contact [facility] Please consult [email or web address] for contact info under         Signed, Margie Billet, PA-C  06/22/2022, 8:15 AM     History and all data above reviewed.  Patient examined.  I agree with the findings as above.    He has some right upper quadrant pain to palpation.  Nausea is improved.  The patient exam reveals COR:RRR  ,  Lungs: Clear  ,  Abd: , Ext No edema   .  All available labs, radiology testing, previous records reviewed. Agree with documented assessment and plan.   Echo pending but overall EF looks to be low normal with mild MR and AI.  NSTEMI:  Cath this afternoon.  Continue low dose beta blocker.  I would be inclined to Eliquis if he does not get an intervention today.  If he does I likely would avoid Eliquis and use DAPT and I will follow with extended monitoring to see the burden of atrial fib.  Either way he will get extended monitoring.  Note mild anemia.  He does not report any overt bleeding.      Randy [name]  10:05 AM  06/22/2022

## 2022-06-22 NOTE — Interval H&P Note (Signed)
Cath Lab Visit (complete for each Cath Lab visit)  Clinical Evaluation Leading to the Procedure:   ACS: Yes.    Non-ACS:    Anginal Classification: CCS III  Anti-ischemic medical therapy: Minimal Therapy (1 class of medications)  Non-Invasive Test Results: No non-invasive testing performed  Prior CABG: No previous CABG      History and Physical Interval Note:  06/22/2022 3:56 PM  Randy [name].  has presented today for surgery, with the diagnosis of nstemi.  The various methods of treatment have been discussed with the patient and family. After consideration of risks, benefits and other options for treatment, the patient has consented to  Procedure(s): LEFT HEART CATH AND CORONARY ANGIOGRAPHY (N/A) as a surgical intervention.  The patient's history has been reviewed, patient examined, no change in status, stable for surgery.  I have reviewed the patient's chart and labs.  Questions were answered to the patient's satisfaction.     Shelva Majestic

## 2022-06-22 NOTE — Progress Notes (Signed)
ANTICOAGULATION CONSULT NOTE   Pharmacy Consult for Heparin  Indication: atrial fibrillation  Allergies  Allergen Reactions   Nsaids     Patient Measurements: Height: '5\' 8"'$  (172.7 cm) Weight: 67.2 kg (148 lb 2.4 oz) IBW/kg (Calculated) : 68.4  Vital Signs: Temp: 99 F (37.2 C) (08/15 1700) Temp Source: Oral (08/15 1700) BP: 119/75 (08/15 1855) Pulse Rate: 65 (08/15 1855)  Labs: Recent Labs    06/21/22 0538 06/21/22 0756 06/21/22 1530 06/22/22 0319 06/22/22 1152  HGB 13.0  --   --  11.2*  --   HCT 38.5*  --   --  32.2*  --   PLT 147*  --   --  104*  --   HEPARINUNFRC  --   --  >1.10* 0.27* 0.29*  CREATININE 1.26*  --   --  1.12  --   TROPONINIHS 127* 1,525*  --   --   --      Estimated Creatinine Clearance: 41.7 mL/min (by C-G formula based on SCr of 1.12 mg/dL).   Medical History: Past Medical History:  Diagnosis Date   CAD (coronary artery disease)    HLD (hyperlipidemia)    HTN (hypertension)    Prostate cancer [facility])       Assessment: 88 y/o M with chest pain, nausea, HA, dizziness. Found to be in [place] and on heparin. Plans are for cath today -heparin level= 0.29 -apixaban cost ~$30 per month  Pt s/p LHC, ok to resume heparin 8h after sheath removal (~1630).   Goal of Therapy:  Heparin level 0.3-0.7 units/ml Monitor platelets by anticoagulation protocol: Yes   Plan:  -Resume heparin 900 units/h no bolus at 0030 (8/16) -Check heparin level 8h after resuming  Arrie Senate, PharmD, BCPS, [facility] Clinical Pharmacist [phone] Please check AMION for all [place] numbers 06/22/2022

## 2022-06-22 NOTE — Progress Notes (Signed)
ANTICOAGULATION CONSULT NOTE   Pharmacy Consult for Heparin  Indication: atrial fibrillation  Allergies  Allergen Reactions   Nsaids     Patient Measurements: Height: '5\' 8"'$  (172.7 cm) Weight: 67.2 kg (148 lb 2.4 oz) IBW/kg (Calculated) : 68.4  Vital Signs: Temp: 98.1 F (36.7 C) (08/15 0400) Temp Source: Oral (08/15 0400) BP: 114/54 (08/15 1001) Pulse Rate: 60 (08/15 1001)  Labs: Recent Labs    06/21/22 0538 06/21/22 0756 06/21/22 1530 06/22/22 0319 06/22/22 1152  HGB 13.0  --   --  11.2*  --   HCT 38.5*  --   --  32.2*  --   PLT 147*  --   --  104*  --   HEPARINUNFRC  --   --  >1.10* 0.27* 0.29*  CREATININE 1.26*  --   --  1.12  --   TROPONINIHS 127* 1,525*  --   --   --      Estimated Creatinine Clearance: 41.7 mL/min (by C-G formula based on SCr of 1.12 mg/dL).   Medical History: Past Medical History:  Diagnosis Date   CAD (coronary artery disease)    HLD (hyperlipidemia)    HTN (hypertension)    Prostate cancer [facility])       Assessment: 88 y/o M with chest pain, nausea, HA, dizziness. Found to be in [place] and on heparin. Plans are for cath today -heparin level= 0.29 -apixaban cost ~$30 per month   Goal of Therapy:  Heparin level 0.3-0.7 units/ml Monitor platelets by anticoagulation protocol: Yes   Plan:  -No changes in heparin now as he is going to cath soon -Will follow plans post cath  Hildred Laser, PharmD Clinical Pharmacist **Pharmacist phone directory can now be found on [email or web address] ([email or web address]).  Listed under [place].

## 2022-06-22 NOTE — TOC Benefit Eligibility Note (Signed)
Patient Teacher, English as a foreign language completed.    The patient is currently admitted and upon discharge could be taking Eliquis 5 mg.  The current 30 day co-pay is $29.99.   The patient is currently admitted and upon discharge could be taking Xarelto 20 mg.  The current 30 day co-pay is $29.02.   The patient is insured through [place] Patient Advocate Specialist [place] Patient Advocate Team Direct Number: [phone]  Fax: [phone]

## 2022-06-22 NOTE — Progress Notes (Signed)
Pt experienced right, lower rib cage pain and became nauseated. EKG obtained and cardiology paged and advised.

## 2022-06-22 NOTE — Telephone Encounter (Signed)
Pharmacy Patient Advocate Encounter  Insurance verification completed.    The patient is insured through [employer] Medicare Part D   The patient is currently admitted and ran test claims for the following: Eliquis, Xarelto.  Copays and coinsurance results were relayed to Inpatient clinical team.      

## 2022-06-22 NOTE — Progress Notes (Addendum)
Progress Note  Patient Name: Randy FEARN Sr. Date of Encounter: 06/22/2022  [facility] Cardiologist: Sanda Klein, MD   Subjective   Patient doing well this AM. Denies chest pain, palpitations, dizziness, Sob.   Inpatient Medications    Scheduled Meds:  apalutamide  240 mg Oral Daily   [START ON 06/23/2022] aspirin  325 mg Oral Daily   metoprolol tartrate  12.5 mg Oral BID   potassium chloride  30 mEq Oral STAT   sertraline  100 mg Oral Daily   simvastatin  20 mg Oral q1800   Continuous Infusions:  sodium chloride 1 mL/kg/hr (06/22/22 0551)   heparin 900 Units/hr (06/22/22 0600)   PRN Meds: acetaminophen **OR** acetaminophen, albuterol, atropine, fentaNYL (SUBLIMAZE) injection, linaclotide, nitroGLYCERIN, trimethobenzamide, zolpidem   Vital Signs    Vitals:   06/21/22 1940 06/22/22 0003 06/22/22 0400 06/22/22 0500  BP: 112/60 104/78 (!) 94/50   Pulse: 66 60 (!) 58   Resp: '19 19 18   '$ Temp: 99 F (37.2 C) 97.8 F (36.6 C) 98.1 F (36.7 C)   TempSrc: Oral Oral Oral   SpO2: 94% 96% 94%   Weight:    67.2 kg  Height:        Intake/Output Summary (Last 24 hours) at 06/22/2022 0815 Last data filed at 06/22/2022 0600 Gross per 24 hour  Intake 1620.47 ml  Output 400 ml  Net 1220.47 ml      06/22/2022    5:00 AM 06/21/2022    5:01 AM  Last 3 Weights  Weight (lbs) 148 lb 2.4 oz 151 lb  Weight (kg) 67.2 kg 68.493 kg      Telemetry    Sinus rhythm, HR in the 50s-60s - Personally Reviewed  ECG    No new tracings - Personally Reviewed  Physical Exam   GEN: No acute distress.  Laying flat on his back  Neck: No JVD Cardiac: RRR, no murmurs, rubs, or gallops. Radial pulses 2+ bilaterally Respiratory: Clear to auscultation bilaterally. GI: Soft, nontender, non-distended  MS: No edema; No deformity. Neuro:  Nonfocal  Psych: Normal affect   Labs    High Sensitivity Troponin:   Recent Labs  Lab 06/21/22 0538 06/21/22 0756  TROPONINIHS 127*  1,525*     Chemistry Recent Labs  Lab 06/21/22 0538 06/21/22 1230 06/22/22 0319  NA 141  --  141  K 3.7  --  4.2  CL 108  --  109  CO2 22  --  26  GLUCOSE 149*  --  117*  BUN 23  --  16  CREATININE 1.26*  --  1.12  CALCIUM 9.2  --  8.7*  MG 2.1 1.9  --   PROT 5.9*  --   --   ALBUMIN 3.8  --   --   AST 21  --   --   ALT 12  --   --   ALKPHOS 57  --   --   BILITOT 0.6  --   --   GFRNONAA 54*  --  >60  ANIONGAP 11  --  6    Lipids  Recent Labs  Lab 06/21/22 1230  CHOL 153  TRIG 59  HDL 39*  LDLCALC 102*  CHOLHDL 3.9    Hematology Recent Labs  Lab 06/21/22 0538 06/22/22 0319  WBC 8.1 4.6  RBC 4.09* 3.47*  HGB 13.0 11.2*  HCT 38.5* 32.2*  MCV 94.1 92.8  MCH 31.8 32.3  MCHC 33.8 34.8  RDW 13.4  13.6  PLT 147* 104*   Thyroid  Recent Labs  Lab 06/21/22 1230  TSH 2.324    BNPNo results for input(s): "BNP", "PROBNP" in the last 168 hours.  DDimer No results for input(s): "DDIMER" in the last 168 hours.   Radiology    DG Abd Portable 1V  Result Date: 06/21/2022 CLINICAL DATA:  Chest pain and shortness of breath EXAM: PORTABLE ABDOMEN - 1 VIEW COMPARISON:  Chest radiograph earlier today FINDINGS: Two views of the abdomen and pelvis. Gas within normal caliber stomach corresponds to the chest radiograph abnormality. Right hemidiaphragm elevation. No gross free intraperitoneal air. No gaseous distention of bowel loops. Pelvic node dissection. Cardiomegaly. Right hemidiaphragm elevation is mild. IMPRESSION: No acute findings. Electronically Signed   By: Abigail Miyamoto M.D.   On: 06/21/2022 14:18   DG Chest Portable 1 View  Result Date: 06/21/2022 CLINICAL DATA:  88 year old male with chest pain. Atrial fibrillation. EXAM: PORTABLE CHEST 1 VIEW COMPARISON:  Chest CT 03/12/2015 and earlier. FINDINGS: Portable AP semi upright view at 0605 hours. Lower lung volumes. Chronic tortuosity and calcification of the thoracic aorta. Stable cardiac size and mediastinal contours.  Visualized tracheal air column is within normal limits. Mild patchy opacity at the left lung base most resembles atelectasis. Elsewhere Allowing for portable technique the lungs are clear. No pneumothorax. Partially visible at least moderate gas distended bowel in the left upper quadrant which is probably the stomach. No acute osseous abnormality identified. IMPRESSION: 1. Lower lung volumes with mild left lung base atelectasis. 2. Partially visible moderately gas distended bowel in the left upper quadrant, probably the stomach. 3.  Aortic Atherosclerosis (ICD10-I70.0). Electronically Signed   By: Genevie Ann M.D.   On: 06/21/2022 06:13    Cardiac Studies     Patient Profile     88 y.o. male with a past medical history of HTN, HLD, CAD, prostate cancer s/p proctectomy who is being seen for the evaluation of atrial fibrillation, elevated troponin   Assessment & Plan    NSTEMI - Patient presented complaining of substernal chest pressure, improved with SL nitro. Found to be in afib with RVR at the time. 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 Improved on repeat EKG - hsTn 127>>1525 - Now on IV heparin - Scheduled to undergo cath today. Patient NPO. Currently chest pain free  - Continue daily ASA, zocar - On metoprolol 12.5 mg BID, BP and HR tolerating  - Echo pending   Paroxysmal Atrial Fibrillation  - Patient presented in Afib with RVR, was started on IV diltiazem, converted to NSR. Dilt drip has since been discontinued. - Per telemetry, patient is maintaining sinus rhythm with HR in the 50s-60s - Continue metoprolol 12.5 mg BID  - On IV heparin for cath today  HTN  - Continue metoprolol 12.5 mg BID - BP stable   For questions or updates, please contact [facility] Please consult [email or web address] for contact info under         Signed, Margie Billet, PA-C  06/22/2022, 8:15 AM     History and all data above reviewed.  Patient examined.  I agree with the findings as above.    He has some right upper quadrant pain to palpation.  Nausea is improved.  The patient exam reveals COR:RRR  ,  Lungs: Clear  ,  Abd: , Ext No edema   .  All available labs, radiology testing, previous records reviewed. Agree with documented assessment and plan.   Echo pending but overall EF looks to be low normal with mild MR and AI.  NSTEMI:  Cath this afternoon.  Continue low dose beta blocker.  I would be inclined to Eliquis if he does not get an intervention today.  If he does I likely would avoid Eliquis and use DAPT and I will follow with extended monitoring to see the burden of atrial fib.  Either way he will get extended monitoring.  Note mild anemia.  He does not report any overt bleeding.      Jeneen Rinks Sonnie Pawloski  10:05 AM  06/22/2022

## 2022-06-22 NOTE — Progress Notes (Signed)
[facility] PROGRESS NOTE    Progress Note  Randy [name] [name].  MEQ:[id number] DOB: 1930-10-09 DOA: 06/21/2022 PCP: [name], NP     Brief Narrative:   Randy [name]. is an 88 y.o. male past medical history significant for hypertension, CAD status post stent prostate cancer status post radical castration comes in complaining of chest pain that started the day prior to admission in the ED he was found in A-fib high sensitive troponins were mildly elevated, twelve-lead EKG showed ST segment elevation in the inferior leads with reciprocal changes in the precordial leads.    Assessment/Plan:   NSTEMI: Continue heparin, metoprolol, aspirin and statins. Cardiology was consulted and recommended a left heart cath. He is currently chest pain-free. Nitroglycerin as needed.  Atrial fibrillation with RVR ([place]) Started on a Cardizem drip and heparin with a chads Vasc score 4. Heart rate improved Cardizem discontinued continue IV heparin. Started on aspirin and statins. Echocardiogram pending Cardiology   Nausea and vomiting: Tigan for nausea, chest x-ray showed no acute abnormalities. Abdominal x-ray showed no acute findings.  Transient hypotension: Probably due to Cardizem which was discontinued was fluid resuscitated. Metoprolol was decreased, blood pressure has remained relatively stable.  Chronic kidney disease stage II With a baseline creatinine of around 1.2.  Prolonged QTc: Noted to be 500, try to keep potassium greater than 4 magnesium greater than 2.  Chronic thrombocytopenia: No signs of bleeding.  Hyperlipidemia: Continue statins.  Metastatic prostate cancer: Continue current medication follow-up with [facility] as an outpatient oncology. DVT prophylaxis: heparin Family Communication:none Status is: Observation The patient remains OBS appropriate and will d/c before 2 midnights.    Code Status:     Code Status Orders  (From  admission, onward)           Start     Ordered   06/21/22 0842  Full code  Continuous        06/21/22 0843           Code Status History     This patient has a current code status but no historical code status.         IV Access:   Peripheral IV   Procedures and diagnostic studies:   DG Abd Portable 1V  Result Date: 06/21/2022 CLINICAL DATA:  Chest pain and shortness of breath EXAM: PORTABLE ABDOMEN - 1 VIEW COMPARISON:  Chest radiograph earlier today FINDINGS: Two views of the abdomen and pelvis. Gas within normal caliber stomach corresponds to the chest radiograph abnormality. Right hemidiaphragm elevation. No gross free intraperitoneal air. No gaseous distention of bowel loops. Pelvic node dissection. Cardiomegaly. Right hemidiaphragm elevation is mild. IMPRESSION: No acute findings. Electronically Signed   By: Abigail Miyamoto M.D.   On: 06/21/2022 14:18   DG Chest Portable 1 View  Result Date: 06/21/2022 CLINICAL DATA:  88 year old male with chest pain. Atrial fibrillation. EXAM: PORTABLE CHEST 1 VIEW COMPARISON:  Chest CT 03/12/2015 and earlier. FINDINGS: Portable AP semi upright view at 0605 hours. Lower lung volumes. Chronic tortuosity and calcification of the thoracic aorta. Stable cardiac size and mediastinal contours. Visualized tracheal air column is within normal limits. Mild patchy opacity at the left lung base most resembles atelectasis. Elsewhere Allowing for portable technique the lungs are clear. No pneumothorax. Partially visible at least moderate gas distended bowel in the left upper quadrant which is probably the stomach. No acute osseous abnormality identified. IMPRESSION: 1. Lower lung volumes with mild left lung base atelectasis. 2.  Partially visible moderately gas distended bowel in the left upper quadrant, probably the stomach. 3.  Aortic Atherosclerosis (ICD10-I70.0). Electronically Signed   By: Genevie Ann M.D.   On: 06/21/2022 06:13     Medical  Consultants:   None.   Subjective:    Randy [name] [name]. denies any chest pain or shortness of breath relates he is hungry this morning.  Objective:    Vitals:   06/21/22 1940 06/22/22 0003 06/22/22 0400 06/22/22 0500  BP: 112/60 104/78 (!) 94/50   Pulse: 66 60 (!) 58   Resp: '19 19 18   '$ Temp: 99 F (37.2 C) 97.8 F (36.6 C) 98.1 F (36.7 C)   TempSrc: Oral Oral Oral   SpO2: 94% 96% 94%   Weight:    67.2 kg  Height:       SpO2: 94 % O2 Flow Rate (L/min): 2 L/min   Intake/Output Summary (Last 24 hours) at 06/22/2022 7371 Last data filed at 06/22/2022 0600 Gross per 24 hour  Intake 1620.47 ml  Output 400 ml  Net 1220.47 ml   Filed Weights   06/21/22 0501 06/22/22 0500  Weight: 68.5 kg 67.2 kg    Exam: General exam: In no acute distress. Respiratory system: Good air movement and clear to auscultation. Cardiovascular system: S1 & S2 heard, RRR. No JVD. Gastrointestinal system: Abdomen is nondistended, soft and nontender.  Extremities: No pedal edema. Skin: No rashes, lesions or ulcers Psychiatry: Judgement and insight appear normal. Mood & affect appropriate.    Data Reviewed:    Labs: Basic Metabolic Panel: Recent Labs  Lab 06/21/22 0538 06/21/22 1230 06/22/22 0319  NA 141  --  141  K 3.7  --  4.2  CL 108  --  109  CO2 22  --  26  GLUCOSE 149*  --  117*  BUN 23  --  16  CREATININE 1.26*  --  1.12  CALCIUM 9.2  --  8.7*  MG 2.1 1.9  --    GFR Estimated Creatinine Clearance: 41.7 mL/min (by C-G formula based on SCr of 1.12 mg/dL). Liver Function Tests: Recent Labs  Lab 06/21/22 0538  AST 21  ALT 12  ALKPHOS 57  BILITOT 0.6  PROT 5.9*  ALBUMIN 3.8   Recent Labs  Lab 06/21/22 0538  LIPASE 30   No results for input(s): "AMMONIA" in the last 168 hours. Coagulation profile No results for input(s): "INR", "PROTIME" in the last 168 hours. COVID-19 Labs  No results for input(s): "DDIMER", "FERRITIN", "LDH", "CRP" in the last 72  hours.  No results found for: "SARSCOV2NAA"  CBC: Recent Labs  Lab 06/21/22 0538 06/22/22 0319  WBC 8.1 4.6  NEUTROABS 6.3  --   HGB 13.0 11.2*  HCT 38.5* 32.2*  MCV 94.1 92.8  PLT 147* 104*   Cardiac Enzymes: No results for input(s): "CKTOTAL", "CKMB", "CKMBINDEX", "TROPONINI" in the last 168 hours. BNP (last 3 results) No results for input(s): "PROBNP" in the last 8760 hours. CBG: No results for input(s): "GLUCAP" in the last 168 hours. D-Dimer: No results for input(s): "DDIMER" in the last 72 hours. Hgb A1c: No results for input(s): "HGBA1C" in the last 72 hours. Lipid Profile: Recent Labs    06/21/22 1230  CHOL 153  HDL 39*  LDLCALC 102*  TRIG 59  CHOLHDL 3.9   Thyroid function studies: Recent Labs    06/21/22 1230  TSH 2.324   Anemia work up: No results for input(s): "VITAMINB12", "FOLATE", "FERRITIN", "TIBC", "  IRON", "RETICCTPCT" in the last 72 hours. Sepsis Labs: Recent Labs  Lab 06/21/22 0538 06/22/22 0319  WBC 8.1 4.6   Microbiology No results found for this or any previous visit (from the past 240 hour(s)).   Medications:    apalutamide  240 mg Oral Daily   [START ON 06/23/2022] aspirin  325 mg Oral Daily   metoprolol tartrate  12.5 mg Oral BID   potassium chloride  30 mEq Oral STAT   sertraline  100 mg Oral Daily   simvastatin  20 mg Oral q1800   sodium chloride flush  3 mL Intravenous Q12H   sodium chloride flush  3 mL Intravenous Q12H   Continuous Infusions:  sodium chloride     sodium chloride 1 mL/kg/hr (06/22/22 0551)   heparin 900 Units/hr (06/22/22 0600)      LOS: 0 days   Charlynne Cousins  [facility]  06/22/2022, 7:02 AM

## 2022-06-23 ENCOUNTER — Inpatient Hospital Stay (HOSPITAL_COMMUNITY): Payer: Medicare Other

## 2022-06-23 ENCOUNTER — Encounter (HOSPITAL_COMMUNITY): Payer: Self-pay | Admitting: Cardiovascular Disease

## 2022-06-23 ENCOUNTER — Other Ambulatory Visit: Payer: Medicare Other

## 2022-06-23 ENCOUNTER — Telehealth: Payer: Self-pay | Admitting: *Deleted

## 2022-06-23 DIAGNOSIS — I214 Non-ST elevation (NSTEMI) myocardial infarction: Secondary | ICD-10-CM | POA: Diagnosis present

## 2022-06-23 DIAGNOSIS — R112 Nausea with vomiting, unspecified: Secondary | ICD-10-CM | POA: Diagnosis present

## 2022-06-23 DIAGNOSIS — I480 Paroxysmal atrial fibrillation: Secondary | ICD-10-CM | POA: Diagnosis present

## 2022-06-23 DIAGNOSIS — I9589 Other hypotension: Secondary | ICD-10-CM | POA: Diagnosis present

## 2022-06-23 DIAGNOSIS — I4891 Unspecified atrial fibrillation: Secondary | ICD-10-CM | POA: Diagnosis not present

## 2022-06-23 DIAGNOSIS — I255 Ischemic cardiomyopathy: Secondary | ICD-10-CM | POA: Diagnosis present

## 2022-06-23 DIAGNOSIS — I25110 Atherosclerotic heart disease of native coronary artery with unstable angina pectoris: Secondary | ICD-10-CM | POA: Diagnosis present

## 2022-06-23 DIAGNOSIS — N182 Chronic kidney disease, stage 2 (mild): Secondary | ICD-10-CM | POA: Diagnosis present

## 2022-06-23 DIAGNOSIS — D696 Thrombocytopenia, unspecified: Secondary | ICD-10-CM | POA: Diagnosis present

## 2022-06-23 DIAGNOSIS — Z820 Family history of epilepsy and other diseases of the nervous system: Secondary | ICD-10-CM | POA: Diagnosis not present

## 2022-06-23 DIAGNOSIS — Z955 Presence of coronary angioplasty implant and graft: Secondary | ICD-10-CM | POA: Diagnosis not present

## 2022-06-23 DIAGNOSIS — I5022 Chronic systolic (congestive) heart failure: Secondary | ICD-10-CM | POA: Diagnosis present

## 2022-06-23 DIAGNOSIS — R627 Adult failure to thrive: Secondary | ICD-10-CM | POA: Diagnosis present

## 2022-06-23 DIAGNOSIS — R0789 Other chest pain: Secondary | ICD-10-CM | POA: Diagnosis present

## 2022-06-23 DIAGNOSIS — I252 Old myocardial infarction: Secondary | ICD-10-CM | POA: Diagnosis not present

## 2022-06-23 DIAGNOSIS — C799 Secondary malignant neoplasm of unspecified site: Secondary | ICD-10-CM | POA: Diagnosis present

## 2022-06-23 DIAGNOSIS — Z79899 Other long term (current) drug therapy: Secondary | ICD-10-CM | POA: Diagnosis not present

## 2022-06-23 DIAGNOSIS — I130 Hypertensive heart and chronic kidney disease with heart failure and stage 1 through stage 4 chronic kidney disease, or unspecified chronic kidney disease: Secondary | ICD-10-CM | POA: Diagnosis present

## 2022-06-23 DIAGNOSIS — C61 Malignant neoplasm of prostate: Secondary | ICD-10-CM | POA: Diagnosis present

## 2022-06-23 DIAGNOSIS — D649 Anemia, unspecified: Secondary | ICD-10-CM | POA: Diagnosis present

## 2022-06-23 DIAGNOSIS — Z808 Family history of malignant neoplasm of other organs or systems: Secondary | ICD-10-CM | POA: Diagnosis not present

## 2022-06-23 DIAGNOSIS — E785 Hyperlipidemia, unspecified: Secondary | ICD-10-CM | POA: Diagnosis present

## 2022-06-23 HISTORY — DX: Non-ST elevation (NSTEMI) myocardial infarction: I21.4

## 2022-06-23 LAB — CBC
HCT: 32.4 % — ABNORMAL LOW (ref 39.0–52.0)
Hemoglobin: 11.1 g/dL — ABNORMAL LOW (ref 13.0–17.0)
MCH: 31.9 pg (ref 26.0–34.0)
MCHC: 34.3 g/dL (ref 30.0–36.0)
MCV: 93.1 fL (ref 80.0–100.0)
Platelets: 117 10*3/uL — ABNORMAL LOW (ref 150–400)
RBC: 3.48 MIL/uL — ABNORMAL LOW (ref 4.22–5.81)
RDW: 13.4 % (ref 11.5–15.5)
WBC: 4.2 10*3/uL (ref 4.0–10.5)
nRBC: 0 % (ref 0.0–0.2)

## 2022-06-23 LAB — HEPARIN LEVEL (UNFRACTIONATED): Heparin Unfractionated: 0.14 IU/mL — ABNORMAL LOW (ref 0.30–0.70)

## 2022-06-23 MED ORDER — LOSARTAN POTASSIUM 25 MG PO TABS
25.0000 mg | ORAL_TABLET | Freq: Every day | ORAL | Status: DC
  Administered 2022-06-23 – 2022-06-25 (×3): 25 mg via ORAL
  Filled 2022-06-23 (×3): qty 1

## 2022-06-23 MED ORDER — ASPIRIN 81 MG PO TBEC
81.0000 mg | DELAYED_RELEASE_TABLET | Freq: Every day | ORAL | Status: DC
  Administered 2022-06-24 – 2022-06-25 (×2): 81 mg via ORAL
  Filled 2022-06-23 (×2): qty 1

## 2022-06-23 MED ORDER — APIXABAN 2.5 MG PO TABS
2.5000 mg | ORAL_TABLET | Freq: Two times a day (BID) | ORAL | Status: DC
  Administered 2022-06-23 – 2022-06-25 (×5): 2.5 mg via ORAL
  Filled 2022-06-23 (×5): qty 1

## 2022-06-23 MED ORDER — APIXABAN 2.5 MG PO TABS: 2.5000 mg | ORAL_TABLET | Freq: Two times a day (BID) | ORAL | Status: DC

## 2022-06-23 MED ORDER — ENSURE ENLIVE PO LIQD
237.0000 mL | Freq: Two times a day (BID) | ORAL | Status: DC
  Administered 2022-06-24 – 2022-06-25 (×2): 237 mL via ORAL

## 2022-06-23 MED ORDER — SENNOSIDES-DOCUSATE SODIUM 8.6-50 MG PO TABS
1.0000 | ORAL_TABLET | Freq: Every day | ORAL | Status: DC
  Administered 2022-06-24: 1 via ORAL
  Filled 2022-06-23 (×2): qty 1

## 2022-06-23 NOTE — Telephone Encounter (Signed)
-----   Message from Wyatt Portela, MD sent at 06/22/2022  4:10 PM EDT ----- Once he is discharged, he can call and reschedule. Thanks ----- Message ----- From: Rolene Course, RN Sent: 06/22/2022   3:59 PM EDT To: Wyatt Portela, MD  This patient's wife called this afternoon, he is in the hospital at [facility], had a MI yesterday.  He has a lab appointment on 8/21 & MD appointment on 8/28.  She is asking if these appointments can be moved out, she doesn't think he will be able to come.  Please advise.

## 2022-06-23 NOTE — Telephone Encounter (Signed)
PC to patient, informed him his appointments in August will be canceled, he is to call us when he DC'd from the hospital to reschedule.  He verbalizes understanding.

## 2022-06-23 NOTE — Plan of Care (Signed)

## 2022-06-23 NOTE — Progress Notes (Addendum)
Progress Note  Patient Name: Randy TREADWAY Sr. Date of Encounter: 06/23/2022  [facility] Cardiologist: Sanda Klein, MD   Subjective   Patient feels weak this AM. Denies chest pain, sob   Inpatient Medications    Scheduled Meds:  apalutamide  240 mg Oral Daily   aspirin  325 mg Oral Daily   metoprolol tartrate  12.5 mg Oral BID   sertraline  100 mg Oral Daily   simvastatin  20 mg Oral q1800   Continuous Infusions:  heparin 900 Units/hr (06/23/22 0344)   PRN Meds: acetaminophen **OR** acetaminophen, albuterol, atropine, fentaNYL (SUBLIMAZE) injection, linaclotide, nitroGLYCERIN, ondansetron (ZOFRAN) IV, trimethobenzamide, zolpidem   Vital Signs    Vitals:   06/22/22 1855 06/22/22 2040 06/23/22 0500 06/23/22 0517  BP: 119/75 (!) 124/56  (!) 115/54  Pulse: 65 72  (!) 52  Resp:    18  Temp:  98 F (36.7 C)  99.2 F (37.3 C)  TempSrc:  Oral  Oral  SpO2: 97% 97%  97%  Weight:   63.7 kg   Height:        Intake/Output Summary (Last 24 hours) at 06/23/2022 1010 Last data filed at 06/23/2022 0500 Gross per 24 hour  Intake 897.75 ml  Output 950 ml  Net -52.25 ml      06/23/2022    5:00 AM 06/22/2022    5:00 AM 06/21/2022    5:01 AM  Last 3 Weights  Weight (lbs) 140 lb 8 oz 148 lb 2.4 oz 151 lb  Weight (kg) 63.73 kg 67.2 kg 68.493 kg      Telemetry    Sinus rhythm with PACs, HR in the 60s - Personally Reviewed  ECG    No new tracings since 8/14 - Personally Reviewed  Physical Exam   GEN: No acute distress.  Laying on his side in the bed, sleeping when I entered room but aroused easily to voice  Neck: No JVD Cardiac: RRR, no murmurs, rubs, or gallops. Right radial cath site is soft and nontender. Radial pulses 2+ bilaterally  Respiratory: Clear to auscultation bilaterally. GI: Soft, nontender, non-distended  MS: No edema; No deformity. Neuro:  Nonfocal  Psych: Normal affect   Labs    High Sensitivity Troponin:   Recent Labs  Lab  06/21/22 0538 06/21/22 0756  TROPONINIHS 127* 1,525*     Chemistry Recent Labs  Lab 06/21/22 0538 06/21/22 1230 06/22/22 0319  NA 141  --  141  K 3.7  --  4.2  CL 108  --  109  CO2 22  --  26  GLUCOSE 149*  --  117*  BUN 23  --  16  CREATININE 1.26*  --  1.12  CALCIUM 9.2  --  8.7*  MG 2.1 1.9  --   PROT 5.9*  --   --   ALBUMIN 3.8  --   --   AST 21  --   --   ALT 12  --   --   ALKPHOS 57  --   --   BILITOT 0.6  --   --   GFRNONAA 54*  --  >60  ANIONGAP 11  --  6    Lipids  Recent Labs  Lab 06/21/22 1230  CHOL 153  TRIG 59  HDL 39*  LDLCALC 102*  CHOLHDL 3.9    Hematology Recent Labs  Lab 06/21/22 0538 06/22/22 0319 06/23/22 0549  WBC 8.1 4.6 4.2  RBC 4.09* 3.47* 3.48*  HGB 13.0  11.2* 11.1*  HCT 38.5* 32.2* 32.4*  MCV 94.1 92.8 93.1  MCH 31.8 32.3 31.9  MCHC 33.8 34.8 34.3  RDW 13.4 13.6 13.4  PLT 147* 104* 117*   Thyroid  Recent Labs  Lab 06/21/22 1230  TSH 2.324    BNPNo results for input(s): "BNP", "PROBNP" in the last 168 hours.  DDimer No results for input(s): "DDIMER" in the last 168 hours.   Radiology    CARDIAC CATHETERIZATION  Result Date: 06/22/2022   Ramus lesion is 99% stenosed.   1st Sept lesion is 70% stenosed.   Ost Cx lesion is 50% stenosed.   Prox LAD to Mid LAD lesion is 40% stenosed.   Mid LAD lesion is 60% stenosed.   1st Mrg lesion is 95% stenosed.   Mid RCA to Dist RCA lesion is 100% stenosed.   Prox RCA lesion is 40% stenosed.   Ost LM to Dist LM lesion is 20% stenosed. Severe multivessel coronary calcification with 20% distal left main narrowing, diffuse calcification of the proximal to mid LAD with 50% proximal stenosis and 60% mid stenosis; subtotal ostial stenosis of a small ramus intermediate vessel; 50% ostial circumflex stenosis with 95% calcified OM1 stenosis; and total occlusion of the mid RCA.  There is extensive collateralization to the PDA and PLA vessels of the RCA via septal and circumflex collaterals. LVEDP 20  mmHg. RECOMMENDATION: Medical therapy.  Suspect troponin elevation is due to recent RCA occlusion with good collateralization to the PDA and PLA system limiting infarct size.  Aggressive lipid-lowering therapy.   ECHOCARDIOGRAM COMPLETE  Result Date: 06/22/2022    ECHOCARDIOGRAM REPORT   Patient Name:   Randy RUTIGLIANO Sr. Date of Exam: 06/22/2022 Medical Rec #:  [id number]            Height:       68.0 in Accession #:    [id number]           Weight:       148.1 lb Date of Birth:  05/23/1930            BSA:          1.799 m Patient Age:    88 years             BP:           106/63 mmHg Patient Gender: M                    HR:           69 bpm. Exam Location:  Inpatient Procedure: 2D Echo, Cardiac Doppler, Color Doppler and Intracardiac            Opacification Agent Indications:    Atrial fibrillation/Chest pain  History:        Patient has no prior history of Echocardiogram examinations.                 CAD; Risk Factors:Hypertension and Dyslipidemia. Prostate Cancer                 ([place]).  Sonographer:    Eartha Inch Referring Phys: [id number] RONDELL A SMITH  Sonographer Comments: Suboptimal apical window. Image acquisition challenging due to patient body habitus and Image acquisition challenging due to respiratory motion. IMPRESSIONS  1. Left ventricular ejection fraction, by estimation, is 35 to 40%. The left ventricle has moderately decreased function. The left ventricle demonstrates regional wall motion abnormalities (see scoring diagram/findings for description). Left ventricular  diastolic  parameters are consistent with Grade II diastolic dysfunction (pseudonormalization).  2. Right ventricular systolic function is normal. The right ventricular size is normal. There is moderately elevated pulmonary artery systolic pressure. The estimated right ventricular systolic pressure is 93.2 mmHg.  3. Left atrial size was mildly dilated.  4. The mitral valve is abnormal. Moderate to severe mitral valve  regurgitation. No evidence of mitral stenosis.  5. Tricuspid valve regurgitation is moderate to severe.  6. The aortic valve is tricuspid. Aortic valve regurgitation is moderate.  7. The inferior vena cava is normal in size with greater than 50% respiratory variability, suggesting right atrial pressure of 3 mmHg. Conclusion(s)/Recommendation(s): Findings consistent with ischemic cardiomyopathy. FINDINGS  Left Ventricle: Left ventricular ejection fraction, by estimation, is 35 to 40%. The left ventricle has moderately decreased function. The left ventricle demonstrates regional wall motion abnormalities. Definity contrast agent was given IV to delineate the left ventricular endocardial borders. The left ventricular internal cavity size was normal in size. There is no left ventricular hypertrophy. Left ventricular diastolic parameters are consistent with Grade II diastolic dysfunction (pseudonormalization).  LV Wall Scoring: The antero-lateral wall and posterior wall are hypokinetic. Right Ventricle: The right ventricular size is normal. No increase in right ventricular wall thickness. Right ventricular systolic function is normal. There is moderately elevated pulmonary artery systolic pressure. The tricuspid regurgitant velocity is 3.30 m/s, and with an assumed right atrial pressure of 8 mmHg, the estimated right ventricular systolic pressure is 67.1 mmHg. Left Atrium: Left atrial size was mildly dilated. Right Atrium: Right atrial size was normal in size. Pericardium: Trivial pericardial effusion is present. Mitral Valve: The mitral valve is abnormal. Moderate to severe mitral valve regurgitation. No evidence of mitral valve stenosis. Tricuspid Valve: The tricuspid valve is grossly normal. Tricuspid valve regurgitation is moderate to severe. No evidence of tricuspid stenosis. Aortic Valve: The aortic valve is tricuspid. Aortic valve regurgitation is moderate. Aortic regurgitation PHT measures 482 msec. Pulmonic  Valve: The pulmonic valve was grossly normal. Pulmonic valve regurgitation is trivial. No evidence of pulmonic stenosis. Aorta: The aortic root and ascending aorta are structurally normal, with no evidence of dilitation. Venous: The inferior vena cava is normal in size with greater than 50% respiratory variability, suggesting right atrial pressure of 3 mmHg. IAS/Shunts: The atrial septum is grossly normal.  LEFT VENTRICLE PLAX 2D LVIDd:         5.00 cm     Diastology LVIDs:         3.60 cm     LV e' medial:    4.58 cm/s LV PW:         0.90 cm     LV E/e' medial:  18.1 LV IVS:        0.80 cm     LV e' lateral:   6.30 cm/s LVOT diam:     1.80 cm     LV E/e' lateral: 13.1 LV SV:         57 LV SV Index:   32 LVOT Area:     2.54 cm  LV Volumes (MOD) LV vol d, MOD A2C: 95.5 ml LV vol d, MOD A4C: 67.4 ml LV vol s, MOD A2C: 48.8 ml LV vol s, MOD A4C: 31.6 ml LV SV MOD A2C:     46.7 ml LV SV MOD A4C:     67.4 ml LV SV MOD BP:      38.7 ml RIGHT VENTRICLE  IVC RV S prime:     15.60 cm/s  IVC diam: 1.80 cm TAPSE (M-mode): 1.8 cm LEFT ATRIUM             Index        RIGHT ATRIUM           Index LA diam:        3.50 cm 1.95 cm/m   RA Area:     13.30 cm LA Vol (A2C):   73.2 ml 40.69 ml/m  RA Volume:   26.60 ml  14.79 ml/m LA Vol (A4C):   48.4 ml 26.90 ml/m LA Biplane Vol: 64.7 ml 35.96 ml/m  AORTIC VALVE LVOT Vmax:   90.20 cm/s LVOT Vmean:  64.500 cm/s LVOT VTI:    0.224 m AI PHT:      482 msec  AORTA Ao Root diam: 3.40 cm Ao Asc diam:  3.40 cm MITRAL VALVE                  TRICUSPID VALVE MV Area (PHT): 3.12 cm       TR Peak grad:   43.6 mmHg MV Decel Time: 243 msec       TR Mean grad:   31.0 mmHg MR Peak grad:    110.7 mmHg   TR Vmax:        330.00 cm/s MR Mean grad:    79.0 mmHg    TR Vmean:       270.0 cm/s MR Vmax:         526.00 cm/s MR Vmean:        424.0 cm/s   SHUNTS MR PISA:         0.57 cm     Systemic VTI:  0.22 m MR PISA Eff ROA: 4 mm        Systemic Diam: 1.80 cm MR PISA Radius:  0.30 cm MV E  velocity: 82.80 cm/s MV A velocity: 94.30 cm/s MV E/A ratio:  0.88 Eleonore Chiquito MD Electronically signed by Eleonore Chiquito MD Signature Date/Time: 06/22/2022/10:08:23 AM    Final    DG Abd Portable 1V  Result Date: 06/21/2022 CLINICAL DATA:  Chest pain and shortness of breath EXAM: PORTABLE ABDOMEN - 1 VIEW COMPARISON:  Chest radiograph earlier today FINDINGS: Two views of the abdomen and pelvis. Gas within normal caliber stomach corresponds to the chest radiograph abnormality. Right hemidiaphragm elevation. No gross free intraperitoneal air. No gaseous distention of bowel loops. Pelvic node dissection. Cardiomegaly. Right hemidiaphragm elevation is mild. IMPRESSION: No acute findings. Electronically Signed   By: Abigail Miyamoto M.D.   On: 06/21/2022 14:18    Cardiac Studies   Echocardiogram 06/22/2022  1. Left ventricular ejection fraction, by estimation, is 35 to 40%. The  left ventricle has moderately decreased function. The left ventricle  demonstrates regional wall motion abnormalities (see scoring  diagram/findings for description). Left ventricular   diastolic parameters are consistent with Grade II diastolic dysfunction  (pseudonormalization).   2. Right ventricular systolic function is normal. The right ventricular  size is normal. There is moderately elevated pulmonary artery systolic  pressure. The estimated right ventricular systolic pressure is 56.2 mmHg.   3. Left atrial size was mildly dilated.   4. The mitral valve is abnormal. Moderate to severe mitral valve  regurgitation. No evidence of mitral stenosis.   5. Tricuspid valve regurgitation is moderate to severe.   6. The aortic valve is tricuspid. Aortic valve regurgitation is moderate.   7. The inferior vena  cava is normal in size with greater than 50%  respiratory variability, suggesting right atrial pressure of 3 mmHg.   Conclusion(s)/Recommendation(s): Findings consistent with ischemic  cardiomyopathy.   Left Heart  Catheterization 06/22/2022   Ramus lesion is 99% stenosed.   1st Sept lesion is 70% stenosed.   Ost Cx lesion is 50% stenosed.   Prox LAD to Mid LAD lesion is 40% stenosed.   Mid LAD lesion is 60% stenosed.   1st Mrg lesion is 95% stenosed.   Mid RCA to Dist RCA lesion is 100% stenosed.   Prox RCA lesion is 40% stenosed.   Ost LM to Dist LM lesion is 20% stenosed.   Severe multivessel coronary calcification with 20% distal left main narrowing, diffuse calcification of the proximal to mid LAD with 50% proximal stenosis and 60% mid stenosis; subtotal ostial stenosis of a small ramus intermediate vessel; 50% ostial circumflex stenosis with 95% calcified OM1 stenosis; and total occlusion of the mid RCA.  There is extensive collateralization to the PDA and PLA vessels of the RCA via septal and circumflex collaterals.   LVEDP 20 mmHg.   RECOMMENDATION: Medical therapy.  Suspect troponin elevation is due to recent RCA occlusion with good collateralization to the PDA and PLA system limiting infarct size.  Aggressive lipid-lowering therapy.   Diagnostic Dominance: Right   Patient Profile     88 y.o. male with a past medical history of HTN, HLD, CAD, prostate cancer s/p proctectomy who is being seen for the evaluation of atrial fibrillation, elevated troponin   Assessment & Plan    NSTEMI - Patient presented complaining of substernal chest pressure, improved with SL nitro. Found to be in afib with RVR at the time. 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 Improved on repeat EKG - hsTn 127>>1525 - Underwent LHC 8/15 that showed severe multivessel coronary calcification with 20% distal left main narrowing, diffuse calcification of the prox-mid LAD with 50% proximal stenosis and 60% mid stenosis, subtotal ostial stenosis of a small ramus  intermediate vessel, 50% ostial circumflex stenosis and 95% calcified OM1 stenosis. There was total occlusion of the mid RCA with good collateral flow. Recommended medical management.  - Continue daily ASA, zocar - On metoprolol 12.5 mg BID, BP and HR tolerating but there is little BP room to further titrate   Ischemic Cardiomyopathy  Chronic HFrEF  - Echo this admission showed EF 35-40%, regional wall motion abnormalities, grade II diastolic dysfunction, normal RV systolic function  - Patient has been on metoprolol 12.5 mg BID this admission - BP occasionally soft-- may be able to tolerate low dose spiro and farxiga, but additional GDMT may be difficult to add at this time. Will discuss with MD   Paroxysmal Atrial Fibrillation  - Patient presented in Afib with RVR, was started on IV diltiazem, converted to NSR. Dilt drip has since been discontinued. - Per telemetry, patient is maintaining sinus rhythm with HR in the 50s-60s. Monitor occasionally alerts as afib, but on my review shows sinus rhythm with PACs and he has not had additional episodes of afib this admission  - Continue metoprolol 12.5 mg BID  - On IV heparin post cath-- as there was no intervention, transition to eliquis. Patient can take ASA and eliquis at discharge  - Will arrange a cardiac monitor at discharge to assess afib  burden    HTN  - Continue metoprolol 12.5 mg BID - BP stable   Moderate-severe MR  Moderate-severe TR  - Noted on echo this admission - Follow with echocardiograms as an outpatient       For questions or updates, please contact [facility] Please consult [email or web address] for contact info under      Signed, Margie Billet, PA-C  06/23/2022, 10:10 AM    History and all data above reviewed.  Patient examined.  I agree with the findings as above.  Weak and nauseated.  No pain.  No SOB.    The patient exam reveals [id number], soft apical systolic murmur, no diastolic  ,  Lungs: Clear  ,  Abd:  Positive bowel sounds, no rebound no guarding, Ext No edema  .  All available labs, radiology testing, previous records reviewed. Agree with documented assessment and plan.   Atrial fib:  Start Eliquis.  Low dose beta blocker for rate control if he has paroxysms.  He will need an out patient 4 week monitor.  Cardiomyopathy:  EF is reduced with valve disease as above.  I will try to titrate a low dose ARB.  Otherwise med titration is limited by low BP.   Jeneen Rinks Maksym Pfiffner  11:26 AM  06/23/2022

## 2022-06-23 NOTE — Discharge Instructions (Signed)

## 2022-06-23 NOTE — Progress Notes (Signed)
[facility] PROGRESS NOTE    Progress Note  Randy HARDWICK Sr.  KXF:[id number] DOB: 07-Sep-1930 DOA: 06/21/2022 PCP: [name], NP     Brief Narrative:   Randy Boston Sr. is an 88 y.o. male past medical history significant for hypertension, CAD status post stent prostate cancer status post radical castration comes in complaining of chest pain that started the day prior to admission in the ED he was found in A-fib high sensitive troponins were mildly elevated, twelve-lead EKG showed ST segment elevation in the inferior leads with reciprocal changes in the precordial leads.    Assessment/Plan:   NSTEMI AFib with RVR Qtc prolongation -S/p cardiac cath on 8/15, showed RCA occlusion with collateralization , commended medical management  -Echo LVEF 35 to 69%, grade 2 diastolic dysfunction, moderate elevated pulmonary artery systolic pressure, left atrial size mildly dilated moderate to severe mitral valve regurg, moderate to severe tricuspid valve regurg, aortic valve regurg is moderate, greater than 50% respiration viability, right atrial pressure about 3 mmhg -he is currently chest pain-free. In sinus rhythm, off cardizem drip -off heparin, transitioned to eliquis, continue metoprolol, aspirin and statins.Nitroglycerin as needed. -Management per cardiology  Transient hypotension: Probably due to Cardizem which was discontinued was fluid resuscitated. Metoprolol was decreased, blood pressure has remained relatively stable.  Nausea and vomiting: Reports vomited 4 times prior to coming to the hospital No vomiting for the last two days, still has no appetite  chest x-ray showed no acute abnormalities. Abdominal x-ray showed no acute findings. No n/v today, no appetite, no bm for the last three days, reports feeling gassy, mild epigastric pain on exam, lft unremarkable, will get ab [place]    Chronic kidney disease stage II With a baseline creatinine of around 1.2. Cr  improved , back to baseline   Thrombocytopenia: No signs of bleeding. Improved   Hyperlipidemia: Ldl102, HDL 39 Continue statins.  Metastatic prostate cancer: Continue current  antiandrogen therapy,  follow-up with the [facility] as an outpatient oncology.  FTT, does not feel ready to do PT today due to feeling weak, agreed to do PT tomorrow  DVT prophylaxis: heparin then eliquis Family Communication:wife over the phone [phone]), wife states no power at home,     Code Status:     Code Status Orders  (From admission, onward)           Start     Ordered   06/21/22 0842  Full code  Continuous        06/21/22 0843           Code Status History     This patient has a current code status but no historical code status.         IV Access:   Peripheral IV   Procedures and diagnostic studies:   CARDIAC CATHETERIZATION  Result Date: 06/22/2022   Ramus lesion is 99% stenosed.   1st Sept lesion is 70% stenosed.   Ost Cx lesion is 50% stenosed.   Prox LAD to Mid LAD lesion is 40% stenosed.   Mid LAD lesion is 60% stenosed.   1st Mrg lesion is 95% stenosed.   Mid RCA to Dist RCA lesion is 100% stenosed.   Prox RCA lesion is 40% stenosed.   Ost LM to Dist LM lesion is 20% stenosed. Severe multivessel coronary calcification with 20% distal left main narrowing, diffuse calcification of the proximal to mid LAD with 50% proximal stenosis and 60% mid stenosis; subtotal  ostial stenosis of a small ramus intermediate vessel; 50% ostial circumflex stenosis with 95% calcified OM1 stenosis; and total occlusion of the mid RCA.  There is extensive collateralization to the PDA and PLA vessels of the RCA via septal and circumflex collaterals. LVEDP 20 mmHg. RECOMMENDATION: Medical therapy.  Suspect troponin elevation is due to recent RCA occlusion with good collateralization to the PDA and PLA system limiting infarct size.  Aggressive lipid-lowering therapy.   ECHOCARDIOGRAM  COMPLETE  Result Date: 06/22/2022    ECHOCARDIOGRAM REPORT   Patient Name:   Randy SCHAPPELL Sr. Date of Exam: 06/22/2022 Medical Rec #:  [id number]            Height:       68.0 in Accession #:    [id number]           Weight:       148.1 lb Date of Birth:  February 07, 1930            BSA:          1.799 m Patient Age:    74 years             BP:           106/63 mmHg Patient Gender: M                    HR:           69 bpm. Exam Location:  Inpatient Procedure: 2D Echo, Cardiac Doppler, Color Doppler and Intracardiac            Opacification Agent Indications:    Atrial fibrillation/Chest pain  History:        Patient has no prior history of Echocardiogram examinations.                 CAD; Risk Factors:Hypertension and Dyslipidemia. Prostate Cancer                 ([place]).  Sonographer:    Eartha Inch Referring Phys: [id number] RONDELL A SMITH  Sonographer Comments: Suboptimal apical window. Image acquisition challenging due to patient body habitus and Image acquisition challenging due to respiratory motion. IMPRESSIONS  1. Left ventricular ejection fraction, by estimation, is 35 to 40%. The left ventricle has moderately decreased function. The left ventricle demonstrates regional wall motion abnormalities (see scoring diagram/findings for description). Left ventricular  diastolic parameters are consistent with Grade II diastolic dysfunction (pseudonormalization).  2. Right ventricular systolic function is normal. The right ventricular size is normal. There is moderately elevated pulmonary artery systolic pressure. The estimated right ventricular systolic pressure is 26.3 mmHg.  3. Left atrial size was mildly dilated.  4. The mitral valve is abnormal. Moderate to severe mitral valve regurgitation. No evidence of mitral stenosis.  5. Tricuspid valve regurgitation is moderate to severe.  6. The aortic valve is tricuspid. Aortic valve regurgitation is moderate.  7. The inferior vena cava is normal in size with greater than  50% respiratory variability, suggesting right atrial pressure of 3 mmHg. Conclusion(s)/Recommendation(s): Findings consistent with ischemic cardiomyopathy. FINDINGS  Left Ventricle: Left ventricular ejection fraction, by estimation, is 35 to 40%. The left ventricle has moderately decreased function. The left ventricle demonstrates regional wall motion abnormalities. Definity contrast agent was given IV to delineate the left ventricular endocardial borders. The left ventricular internal cavity size was normal in size. There is no left ventricular hypertrophy. Left ventricular diastolic parameters are consistent with Grade II diastolic dysfunction (pseudonormalization).  LV Wall  Scoring: The antero-lateral wall and posterior wall are hypokinetic. Right Ventricle: The right ventricular size is normal. No increase in right ventricular wall thickness. Right ventricular systolic function is normal. There is moderately elevated pulmonary artery systolic pressure. The tricuspid regurgitant velocity is 3.30 m/s, and with an assumed right atrial pressure of 8 mmHg, the estimated right ventricular systolic pressure is 78.2 mmHg. Left Atrium: Left atrial size was mildly dilated. Right Atrium: Right atrial size was normal in size. Pericardium: Trivial pericardial effusion is present. Mitral Valve: The mitral valve is abnormal. Moderate to severe mitral valve regurgitation. No evidence of mitral valve stenosis. Tricuspid Valve: The tricuspid valve is grossly normal. Tricuspid valve regurgitation is moderate to severe. No evidence of tricuspid stenosis. Aortic Valve: The aortic valve is tricuspid. Aortic valve regurgitation is moderate. Aortic regurgitation PHT measures 482 msec. Pulmonic Valve: The pulmonic valve was grossly normal. Pulmonic valve regurgitation is trivial. No evidence of pulmonic stenosis. Aorta: The aortic root and ascending aorta are structurally normal, with no evidence of dilitation. Venous: The inferior vena  cava is normal in size with greater than 50% respiratory variability, suggesting right atrial pressure of 3 mmHg. IAS/Shunts: The atrial septum is grossly normal.  LEFT VENTRICLE PLAX 2D LVIDd:         5.00 cm     Diastology LVIDs:         3.60 cm     LV e' medial:    4.58 cm/s LV PW:         0.90 cm     LV E/e' medial:  18.1 LV IVS:        0.80 cm     LV e' lateral:   6.30 cm/s LVOT diam:     1.80 cm     LV E/e' lateral: 13.1 LV SV:         57 LV SV Index:   32 LVOT Area:     2.54 cm  LV Volumes (MOD) LV vol d, MOD A2C: 95.5 ml LV vol d, MOD A4C: 67.4 ml LV vol s, MOD A2C: 48.8 ml LV vol s, MOD A4C: 31.6 ml LV SV MOD A2C:     46.7 ml LV SV MOD A4C:     67.4 ml LV SV MOD BP:      38.7 ml RIGHT VENTRICLE             IVC RV S prime:     15.60 cm/s  IVC diam: 1.80 cm TAPSE (M-mode): 1.8 cm LEFT ATRIUM             Index        RIGHT ATRIUM           Index LA diam:        3.50 cm 1.95 cm/m   RA Area:     13.30 cm LA Vol (A2C):   73.2 ml 40.69 ml/m  RA Volume:   26.60 ml  14.79 ml/m LA Vol (A4C):   48.4 ml 26.90 ml/m LA Biplane Vol: 64.7 ml 35.96 ml/m  AORTIC VALVE LVOT Vmax:   90.20 cm/s LVOT Vmean:  64.500 cm/s LVOT VTI:    0.224 m AI PHT:      482 msec  AORTA Ao Root diam: 3.40 cm Ao Asc diam:  3.40 cm MITRAL VALVE                  TRICUSPID VALVE MV Area (PHT): 3.12 cm       TR  Peak grad:   43.6 mmHg MV Decel Time: 243 msec       TR Mean grad:   31.0 mmHg MR Peak grad:    110.7 mmHg   TR Vmax:        330.00 cm/s MR Mean grad:    79.0 mmHg    TR Vmean:       270.0 cm/s MR Vmax:         526.00 cm/s MR Vmean:        424.0 cm/s   SHUNTS MR PISA:         0.57 cm     Systemic VTI:  0.22 m MR PISA Eff ROA: 4 mm        Systemic Diam: 1.80 cm MR PISA Radius:  0.30 cm MV E velocity: 82.80 cm/s MV A velocity: 94.30 cm/s MV E/A ratio:  0.88 Eleonore Chiquito MD Electronically signed by Eleonore Chiquito MD Signature Date/Time: 06/22/2022/10:08:23 AM    Final    DG Abd Portable 1V  Result Date: 06/21/2022 CLINICAL DATA:   Chest pain and shortness of breath EXAM: PORTABLE ABDOMEN - 1 VIEW COMPARISON:  Chest radiograph earlier today FINDINGS: Two views of the abdomen and pelvis. Gas within normal caliber stomach corresponds to the chest radiograph abnormality. Right hemidiaphragm elevation. No gross free intraperitoneal air. No gaseous distention of bowel loops. Pelvic node dissection. Cardiomegaly. Right hemidiaphragm elevation is mild. IMPRESSION: No acute findings. Electronically Signed   By: Abigail Miyamoto M.D.   On: 06/21/2022 14:18     Medical Consultants:   None.   Subjective:    Gildardo Griffes Konen Sr. 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)  Objective:    Vitals:   06/22/22 1855 06/22/22 2040 06/23/22 0500 06/23/22 0517  BP: 119/75 (!) 124/56  (!) 115/54  Pulse: 65 72  (!) 52  Resp:    18  Temp:  98 F (36.7 C)  99.2 F (37.3 C)  TempSrc:  Oral  Oral  SpO2: 97% 97%  97%  Weight:   63.7 kg   Height:       SpO2: 97 % O2 Flow Rate (L/min): 2 L/min   Intake/Output Summary (Last 24 hours) at 06/23/2022 0729 Last data filed at 06/23/2022 0500 Gross per 24 hour  Intake 897.75 ml  Output 950 ml  Net -52.25 ml   Filed Weights   06/21/22 0501 06/22/22 0500 06/23/22 0500  Weight: 68.5 kg 67.2 kg 63.7 kg    Exam: General exam: frail, hard of hearing, NAD. Respiratory system: Good air movement and clear to auscultation. Cardiovascular system: S1 & S2 heard, RRR. No JVD. Gastrointestinal system:  mild epigastric tenderness,  soft , +bs Extremities: No pedal edema. Skin: No rashes, lesions or ulcers Psychiatry: Judgement and insight appear normal. Mood & affect  appropriate.    Data Reviewed:    Labs: Basic Metabolic Panel: Recent Labs  Lab 06/21/22 0538 06/21/22 1230 06/22/22 0319  NA 141  --  141  K 3.7  --  4.2  CL 108  --  109  CO2 22  --  26  GLUCOSE 149*  --  117*  BUN 23  --  16  CREATININE 1.26*  --  1.12  CALCIUM 9.2  --  8.7*  MG 2.1 1.9  --    GFR Estimated Creatinine Clearance: 39.5 mL/min (by C-G formula based on SCr of 1.12 mg/dL). Liver Function Tests: Recent Labs  Lab 06/21/22 0538  AST 21  ALT 12  ALKPHOS 57  BILITOT 0.6  PROT 5.9*  ALBUMIN 3.8   Recent Labs  Lab 06/21/22 0538  LIPASE 30   No results for input(s): "AMMONIA" in the last 168 hours. Coagulation profile No results for input(s): "INR", "PROTIME" in the last 168 hours. COVID-19 Labs  No results for input(s): "DDIMER", "FERRITIN", "LDH", "CRP" in the last 72 hours.  No results found for: "SARSCOV2NAA"  CBC: Recent Labs  Lab 06/21/22 0538 06/22/22 0319 06/23/22 0549  WBC 8.1 4.6 4.2  NEUTROABS 6.3  --   --   HGB 13.0 11.2* 11.1*  HCT 38.5* 32.2* 32.4*  MCV 94.1 92.8 93.1  PLT 147* 104* 117*   Cardiac Enzymes: No results for input(s): "CKTOTAL", "CKMB", "CKMBINDEX", "TROPONINI" in the last 168 hours. BNP (last 3 results) No results for input(s): "PROBNP" in the last 8760 hours. CBG: No results for input(s): "GLUCAP" in the last 168 hours. D-Dimer: No results for input(s): "DDIMER" in the last 72 hours. Hgb A1c: No results for input(s): "HGBA1C" in the last 72 hours. Lipid Profile: Recent Labs    06/21/22 1230  CHOL 153  HDL 39*  LDLCALC 102*  TRIG 59  CHOLHDL 3.9   Thyroid function studies: Recent Labs    06/21/22 1230  TSH 2.324   Anemia work up: No results for input(s): "VITAMINB12", "FOLATE", "FERRITIN", "TIBC", "IRON", "RETICCTPCT" in the last 72 hours. Sepsis Labs: Recent Labs  Lab 06/21/22 0538 06/22/22 0319 06/23/22 0549  WBC 8.1 4.6 4.2   Microbiology No results found for this or any previous  visit (from the past 240 hour(s)).   Medications:    apalutamide  240 mg Oral Daily   aspirin  325 mg Oral Daily   metoprolol tartrate  12.5 mg Oral BID   sertraline  100 mg Oral Daily   simvastatin  20 mg Oral q1800   Continuous Infusions:  heparin 900 Units/hr (06/23/22 0344)      LOS: 0 days   Florencia Reasons MD PhD FACP [facility]  06/23/2022, 7:29 AM

## 2022-06-23 NOTE — Progress Notes (Signed)
ANTICOAGULATION CONSULT NOTE   Pharmacy Consult for Heparin > apixaban Indication: atrial fibrillation  Allergies  Allergen Reactions   Nsaids     Patient Measurements: Height: '5\' 8"'$  (172.7 cm) Weight: 63.7 kg (140 lb 8 oz) IBW/kg (Calculated) : 68.4  Vital Signs: Temp: 99.2 F (37.3 C) (08/16 1032) Temp Source: Oral (08/16 1032) BP: 114/55 (08/16 1032) Pulse Rate: 56 (08/16 1032)  Labs: Recent Labs    06/21/22 0538 06/21/22 0756 06/21/22 1530 06/22/22 0319 06/22/22 1152 06/23/22 0549 06/23/22 0900  HGB 13.0  --   --  11.2*  --  11.1*  --   HCT 38.5*  --   --  32.2*  --  32.4*  --   PLT 147*  --   --  104*  --  117*  --   HEPARINUNFRC  --   --    < > 0.27* 0.29*  --  0.14*  CREATININE 1.26*  --   --  1.12  --   --   --   TROPONINIHS 127* 1,525*  --   --   --   --   --    < > = values in this interval not displayed.     Estimated Creatinine Clearance: 39.5 mL/min (by C-G formula based on SCr of 1.12 mg/dL).   Medical History: Past Medical History:  Diagnosis Date   CAD (coronary artery disease)    HLD (hyperlipidemia)    HTN (hypertension)    Prostate cancer [facility])       Assessment: 88 y/o M with chest pain, nausea, HA, dizziness. Found to be in [place] now s/p cath with plans for medical therapy. Per cardiology okay to start apixaban -Wt= 63.7kg. SCr= 1.12  -apixaban cost ~$30 per month.   Goal of Therapy:   Monitor platelets by anticoagulation protocol: Yes   Plan:  -Start apixaban at 2.'5mg'$  po daily. Weight cut off for low dose is 60kg. Since he is is very close to this will continue with low dose (discussed with cardiology -decrease Aspirin to '81mg'$ /day  Hildred Laser, PharmD Clinical Pharmacist **Pharmacist phone directory can now be found on [email or web address] ([email or web address]).  Listed under [place].

## 2022-06-24 DIAGNOSIS — I4891 Unspecified atrial fibrillation: Secondary | ICD-10-CM | POA: Diagnosis not present

## 2022-06-24 LAB — CBC
HCT: 32.9 % — ABNORMAL LOW (ref 39.0–52.0)
Hemoglobin: 11.4 g/dL — ABNORMAL LOW (ref 13.0–17.0)
MCH: 31.8 pg (ref 26.0–34.0)
MCHC: 34.7 g/dL (ref 30.0–36.0)
MCV: 91.9 fL (ref 80.0–100.0)
Platelets: 109 10*3/uL — ABNORMAL LOW (ref 150–400)
RBC: 3.58 MIL/uL — ABNORMAL LOW (ref 4.22–5.81)
RDW: 13.2 % (ref 11.5–15.5)
WBC: 4.2 10*3/uL (ref 4.0–10.5)
nRBC: 0 % (ref 0.0–0.2)

## 2022-06-24 LAB — COMPREHENSIVE METABOLIC PANEL
ALT: 12 U/L (ref 0–44)
AST: 20 U/L (ref 15–41)
Albumin: 2.9 g/dL — ABNORMAL LOW (ref 3.5–5.0)
Alkaline Phosphatase: 48 U/L (ref 38–126)
Anion gap: 5 (ref 5–15)
BUN: 14 mg/dL (ref 8–23)
CO2: 25 mmol/L (ref 22–32)
Calcium: 8.3 mg/dL — ABNORMAL LOW (ref 8.9–10.3)
Chloride: 109 mmol/L (ref 98–111)
Creatinine, Ser: 1.08 mg/dL (ref 0.61–1.24)
GFR, Estimated: >60 mL/min (ref >60)
Glucose, Bld: 111 mg/dL — ABNORMAL HIGH (ref 70–99)
Potassium: 3.6 mmol/L (ref 3.5–5.1)
Sodium: 139 mmol/L (ref 135–145)
Total Bilirubin: 0.3 mg/dL (ref 0.3–1.2)
Total Protein: 5 g/dL — ABNORMAL LOW (ref 6.5–8.1)

## 2022-06-24 LAB — MAGNESIUM: Magnesium: 1.9 mg/dL (ref 1.7–2.4)

## 2022-06-24 LAB — LACTIC ACID, PLASMA: Lactic Acid, Venous: 1 mmol/L (ref 0.5–1.9)

## 2022-06-24 MED ORDER — SIMVASTATIN 20 MG PO TABS
40.0000 mg | ORAL_TABLET | Freq: Every day | ORAL | Status: DC
  Administered 2022-06-24: 40 mg via ORAL
  Filled 2022-06-24: qty 2

## 2022-06-24 NOTE — Progress Notes (Addendum)
Progress Note  Patient Name: Randy BIELINSKI Sr. Date of Encounter: 06/24/2022  [facility] Cardiologist: Sanda Klein, MD   Subjective   Patient is feeling well this AM. Denies any chest pain, SOB, ankle edema, palpitations. Denies any abdominal pain, yesterday he had some ruq pain, but this has improved   Inpatient Medications    Scheduled Meds:  apalutamide  240 mg Oral Daily   apixaban  2.5 mg Oral BID   aspirin EC  81 mg Oral Daily   feeding supplement  237 mL Oral BID BM   losartan  25 mg Oral Daily   metoprolol tartrate  12.5 mg Oral BID   senna-docusate  1 tablet Oral QHS   sertraline  100 mg Oral Daily   simvastatin  20 mg Oral q1800   Continuous Infusions:  PRN Meds: acetaminophen **OR** acetaminophen, albuterol, atropine, fentaNYL (SUBLIMAZE) injection, linaclotide, nitroGLYCERIN, ondansetron (ZOFRAN) IV, trimethobenzamide, zolpidem   Vital Signs    Vitals:   06/23/22 1032 06/23/22 2019 06/23/22 2359 06/24/22 0500  BP: (!) 114/55 118/68 122/84 (!) 144/69  Pulse: (!) 56 72 74 67  Resp: '18 18 17 18  '$ Temp: 99.2 F (37.3 C) 98 F (36.7 C) 98.8 F (37.1 C) 97.9 F (36.6 C)  TempSrc: Oral Oral Oral Oral  SpO2: 97% 97% 99% 95%  Weight:    60.3 kg  Height:        Intake/Output Summary (Last 24 hours) at 06/24/2022 0935 Last data filed at 06/24/2022 0500 Gross per 24 hour  Intake 0 ml  Output 400 ml  Net -400 ml      06/24/2022    5:00 AM 06/23/2022    5:00 AM 06/22/2022    5:00 AM  Last 3 Weights  Weight (lbs) 132 lb 15 oz 140 lb 8 oz 148 lb 2.4 oz  Weight (kg) 60.3 kg 63.73 kg 67.2 kg      Telemetry    Sinus rhythm - Personally Reviewed  ECG    No new tracings since 8/14 - Personally Reviewed  Physical Exam   GEN: No acute distress. Laying flat in the bed  Neck: No JVD Cardiac: RRR, no murmurs, rubs, or gallops. Radial pulses 2+ bilaterally  Respiratory: Clear to auscultation bilaterally. Normal WOB on room air  GI: Soft,  nontender, non-distended  MS: No edema; No deformity. Neuro:  Nonfocal  Psych: Normal affect   Labs    High Sensitivity Troponin:   Recent Labs  Lab 06/21/22 0538 06/21/22 0756  TROPONINIHS 127* 1,525*     Chemistry Recent Labs  Lab 06/21/22 0538 06/21/22 1230 06/22/22 0319 06/24/22 0339  NA 141  --  141 139  K 3.7  --  4.2 3.6  CL 108  --  109 109  CO2 22  --  26 25  GLUCOSE 149*  --  117* 111*  BUN 23  --  16 14  CREATININE 1.26*  --  1.12 1.08  CALCIUM 9.2  --  8.7* 8.3*  MG 2.1 1.9  --  1.9  PROT 5.9*  --   --  5.0*  ALBUMIN 3.8  --   --  2.9*  AST 21  --   --  20  ALT 12  --   --  12  ALKPHOS 57  --   --  48  BILITOT 0.6  --   --  0.3  GFRNONAA 54*  --  >60 >60  ANIONGAP 11  --  6 5  Lipids  Recent Labs  Lab 06/21/22 1230  CHOL 153  TRIG 59  HDL 39*  LDLCALC 102*  CHOLHDL 3.9    Hematology Recent Labs  Lab 06/22/22 0319 06/23/22 0549 06/24/22 0339  WBC 4.6 4.2 4.2  RBC 3.47* 3.48* 3.58*  HGB 11.2* 11.1* 11.4*  HCT 32.2* 32.4* 32.9*  MCV 92.8 93.1 91.9  MCH 32.3 31.9 31.8  MCHC 34.8 34.3 34.7  RDW 13.6 13.4 13.2  PLT 104* 117* 109*   Thyroid  Recent Labs  Lab 06/21/22 1230  TSH 2.324    BNPNo results for input(s): "BNP", "PROBNP" in the last 168 hours.  DDimer No results for input(s): "DDIMER" in the last 168 hours.   Radiology    US Abdomen Limited  Result Date: 06/23/2022 CLINICAL DATA:  Nausea and vomiting. EXAM: ULTRASOUND ABDOMEN LIMITED RIGHT UPPER QUADRANT COMPARISON:  CT abdomen and pelvis 12/15/2020 FINDINGS: Gallbladder: No gallstones or wall thickening visualized. Gallbladder wall thickness is upper limits of normal. There is a small amount of pericholecystic fluid. No sonographic Murphy sign noted by sonographer. Common bile duct: Diameter: 3 mm Liver: No focal lesion identified. Within normal limits in parenchymal echogenicity. Portal vein is patent on color Doppler imaging with normal direction of blood flow towards the  liver. Other: Right pleural effusion is present. Incidental finding of 4.0 by 3.8 x 3.0 cm right renal cyst with single thin septation. IMPRESSION: 1. No sonographic evidence for cholelithiasis or acute cholecystitis. 2. There is a small amount of pericholecystic fluid of uncertain etiology. Please correlate clinically. 3. Right pleural effusion. 4. Right renal cyst.  No follow-up necessary. Electronically Signed   By: Ronney Asters M.D.   On: 06/23/2022 19:33   CARDIAC CATHETERIZATION  Result Date: 06/22/2022   Ramus lesion is 99% stenosed.   1st Sept lesion is 70% stenosed.   Ost Cx lesion is 50% stenosed.   Prox LAD to Mid LAD lesion is 40% stenosed.   Mid LAD lesion is 60% stenosed.   1st Mrg lesion is 95% stenosed.   Mid RCA to Dist RCA lesion is 100% stenosed.   Prox RCA lesion is 40% stenosed.   Ost LM to Dist LM lesion is 20% stenosed. Severe multivessel coronary calcification with 20% distal left main narrowing, diffuse calcification of the proximal to mid LAD with 50% proximal stenosis and 60% mid stenosis; subtotal ostial stenosis of a small ramus intermediate vessel; 50% ostial circumflex stenosis with 95% calcified OM1 stenosis; and total occlusion of the mid RCA.  There is extensive collateralization to the PDA and PLA vessels of the RCA via septal and circumflex collaterals. LVEDP 20 mmHg. RECOMMENDATION: Medical therapy.  Suspect troponin elevation is due to recent RCA occlusion with good collateralization to the PDA and PLA system limiting infarct size.  Aggressive lipid-lowering therapy.    Cardiac Studies   Echocardiogram 06/22/2022  1. Left ventricular ejection fraction, by estimation, is 35 to 40%. The  left ventricle has moderately decreased function. The left ventricle  demonstrates regional wall motion abnormalities (see scoring  diagram/findings for description). Left ventricular   diastolic parameters are consistent with Grade II diastolic dysfunction  (pseudonormalization).    2. Right ventricular systolic function is normal. The right ventricular  size is normal. There is moderately elevated pulmonary artery systolic  pressure. The estimated right ventricular systolic pressure is 66.0 mmHg.   3. Left atrial size was mildly dilated.   4. The mitral valve is abnormal. Moderate to severe mitral valve  regurgitation. No evidence of mitral stenosis.   5. Tricuspid valve regurgitation is moderate to severe.   6. The aortic valve is tricuspid. Aortic valve regurgitation is moderate.   7. The inferior vena cava is normal in size with greater than 50%  respiratory variability, suggesting right atrial pressure of 3 mmHg.   Conclusion(s)/Recommendation(s): Findings consistent with ischemic  cardiomyopathy.    Left Heart Catheterization 06/22/2022   Ramus lesion is 99% stenosed.   1st Sept lesion is 70% stenosed.   Ost Cx lesion is 50% stenosed.   Prox LAD to Mid LAD lesion is 40% stenosed.   Mid LAD lesion is 60% stenosed.   1st Mrg lesion is 95% stenosed.   Mid RCA to Dist RCA lesion is 100% stenosed.   Prox RCA lesion is 40% stenosed.   Ost LM to Dist LM lesion is 20% stenosed.   Severe multivessel coronary calcification with 20% distal left main narrowing, diffuse calcification of the proximal to mid LAD with 50% proximal stenosis and 60% mid stenosis; subtotal ostial stenosis of a small ramus intermediate vessel; 50% ostial circumflex stenosis with 95% calcified OM1 stenosis; and total occlusion of the mid RCA.  There is extensive collateralization to the PDA and PLA vessels of the RCA via septal and circumflex collaterals.   LVEDP 20 mmHg.   RECOMMENDATION: Medical therapy.  Suspect troponin elevation is due to recent RCA occlusion with good collateralization to the PDA and PLA system limiting infarct size.  Aggressive lipid-lowering therapy.   Diagnostic Dominance: Right     Patient Profile     88 y.o. male  with a past medical history of HTN, HLD, CAD,  prostate cancer s/p proctectomy who is being seen for the evaluation of atrial fibrillation, elevated troponin   Assessment & Plan    NSTEMI - Patient presented complaining of substernal chest pressure, improved with SL nitro. Found to be in afib with RVR at the time. 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 Improved on repeat EKG - hsTn 127>>1525 - Underwent LHC 8/15 that showed severe multivessel coronary calcification with 20% distal left main narrowing, diffuse calcification of the prox-mid LAD with 50% proximal stenosis and 60% mid stenosis, subtotal ostial stenosis of a small ramus intermediate vessel, 50% ostial circumflex stenosis and 95% calcified OM1 stenosis. There was total occlusion of the mid RCA with good collateral flow. Recommended medical management.  - Continue daily ASA, Zocor (increased dose to 40 mg daily due to CAD)  - On metoprolol 12.5 mg BID, BP and HR tolerating    Ischemic Cardiomyopathy  Chronic HFrEF  - Echo this admission showed EF 35-40%, regional wall motion abnormalities, grade II diastolic dysfunction, normal RV systolic function  - Patient has been on metoprolol 12.5 mg BID this admission - Started losartan 25 mg BID yesterday-- BP and renal function tolerating    Right Pleural Effusion  - Patient complained of right sided pain yesterday-- abdominal ultrasound showed a right pleural effusion. Currently denies any right sided pain  - Managed per primary-- planned ultrasound guided thora is now cancelled since he feels better  Paroxysmal Atrial Fibrillation  - Patient presented in Afib with RVR, was started on IV diltiazem, converted to NSR. Dilt drip has since been discontinued. - Per telemetry, patient maintaining sinus rhythm  - Continue metoprolol 12.5 mg BID  -  Now on eliquis 2.5 mg BID (dosed for  age, weight)  - Will arrange a cardiac monitor at discharge to assess afib burden    HTN  - Continue metoprolol 12.5 mg BID, losartan 25 mg daily  - BP stable    Moderate-severe MR  Moderate-severe TR  - Noted on echo this admission - Follow with echocardiograms as an outpatient       For questions or updates, please contact [facility] Please consult [email or web address] for contact info under      Signed, Margie Billet, PA-C  06/24/2022, 9:35 AM     History and all data above reviewed.  Patient examined.  I agree with the findings as above.  No chest pain.  No abdominal pain. Breathing OK.  The patient exam reveals COR:RRR  ,  Lungs: Clear  ,  Abd: Positive bowel sounds, no rebound no guarding, Ext No edema  .  All available labs, radiology testing, previous records reviewed. Agree with documented assessment and plan.   Atrial fib:  No recurrence.  Started Eliquis.  On low dose beta blocker.  Plan home monitor.   Cardiomyopathy:  Ischemic.  Continue current therapy.   Jeneen Rinks Cassadie Pankonin  12:38 PM  06/24/2022

## 2022-06-24 NOTE — Evaluation (Signed)
Physical Therapy Evaluation Patient Details Name: Randy [name] Sr. MRN: [id number] DOB: January 23, 1930 Today's Date: 06/24/2022  History of Present Illness  88 y.o. male presents to [facility] hospital on 06/21/2022 with chest pain and SOB. Pt found to be in afib with RVR, also with elevated troponins and concern for NSTEMI. LHC on 8/15 shows severe multivessel stenosis. PMH includes HTN, HLD, CAD, prostate CA.  Clinical Impression  Pt presents to PT with mild deficits in gait/balance, drifting intermittently to left/right when ambulating in the hallway but not losing balance. Pt will benefit from frequent mobilization in an effort to restore independence. PT recommends discharge home when medically ready, no PT or DME needs.       Recommendations for follow up therapy are one component of a multi-disciplinary discharge planning process, led by the attending physician.  Recommendations may be updated based on patient status, additional functional criteria and insurance authorization.  Follow Up Recommendations No PT follow up      Assistance Recommended at Discharge PRN  Patient can return home with the following  Assistance with cooking/housework;Assist for transportation;Help with stairs or ramp for entrance    Equipment Recommendations None recommended by PT  Recommendations for Other Services       Functional Status Assessment Patient has had a recent decline in their functional status and demonstrates the ability to make significant improvements in function in a reasonable and predictable amount of time.     Precautions / Restrictions Precautions Precautions: None Restrictions Weight Bearing Restrictions: No      Mobility  Bed Mobility Overal bed mobility: Modified Independent                  Transfers Overall transfer level: Independent                      Ambulation/Gait Ambulation/Gait assistance: Supervision Gait Distance (Feet): 400 Feet Assistive  device: None Gait Pattern/deviations: Step-through pattern, Drifts right/left Gait velocity: functional Gait velocity interpretation: >2.62 ft/sec, indicative of community ambulatory   General Gait Details: pt with mild lateral drift, no LOB noted  Stairs            Wheelchair Mobility    Modified Rankin (Stroke Patients Only)       Balance Overall balance assessment: Needs assistance Sitting-balance support: No upper extremity supported, Feet supported Sitting balance-Leahy Scale: Good     Standing balance support: No upper extremity supported, During functional activity Standing balance-Leahy Scale: Good                               Pertinent Vitals/Pain Pain Assessment Pain Assessment: No/denies pain    Home Living Family/patient expects to be discharged to:: Private residence Living Arrangements: Spouse/significant other Available Help at Discharge: Family;Available 24 hours/day Type of Home: House Home Access: Ramped entrance       Home Layout: Multi-level;Able to live on main level with bedroom/bathroom Home Equipment: Rolling Walker (2 wheels);Cane - single point;Crutches;BSC/3in1;Shower seat      Prior Function Prior Level of Function : Independent/Modified Independent;Driving                     Hand Dominance        Extremity/Trunk Assessment   Upper Extremity Assessment Upper Extremity Assessment: Overall WFL for tasks assessed    Lower Extremity Assessment Lower Extremity Assessment: Overall WFL for tasks assessed    Cervical /  Trunk Assessment Cervical / Trunk Assessment: Normal  Communication   Communication: No difficulties  Cognition Arousal/Alertness: Awake/alert Behavior During Therapy: WFL for tasks assessed/performed Overall Cognitive Status: Within Functional Limits for tasks assessed                                          General Comments General comments (skin integrity, edema,  etc.): VSS on RA    Exercises     Assessment/Plan    PT Assessment Patient needs continued PT services  PT Problem List Decreased balance;Cardiopulmonary status limiting activity       PT Treatment Interventions Gait training;Balance training;Neuromuscular re-education;Patient/family education;Therapeutic activities    PT Goals (Current goals can be found in the Care Plan section)  Acute Rehab PT Goals Patient Stated Goal: to return home PT Goal Formulation: With patient Time For Goal Achievement: 07/08/22 Potential to Achieve Goals: Good Additional Goals Additional Goal #1: Pt will score >19/24 on the DGI to indicate a reduced risk for falls    Frequency Min 2X/week     Co-evaluation               AM-PAC PT "6 Clicks" Mobility  Outcome Measure Help needed turning from your back to your side while in a flat bed without using bedrails?: None Help needed moving from lying on your back to sitting on the side of a flat bed without using bedrails?: None Help needed moving to and from a bed to a chair (including a wheelchair)?: None Help needed standing up from a chair using your arms (e.g., wheelchair or bedside chair)?: None Help needed to walk in hospital room?: A Little Help needed climbing 3-5 steps with a railing? : A Little 6 Click Score: 22    End of Session   Activity Tolerance: Patient tolerated treatment well Patient left: in bed;with call bell/phone within reach Nurse Communication: Mobility status PT Visit Diagnosis: Other abnormalities of gait and mobility (R26.89)    Time: 2637-8588 PT Time Calculation (min) (ACUTE ONLY): 32 min   Charges:   PT Evaluation $PT Eval Low Complexity: [place], PT, DPT Acute Rehabilitation Office [phone]   Zenaida Niece 06/24/2022, 3:46 PM

## 2022-06-24 NOTE — Progress Notes (Signed)
[facility] PROGRESS NOTE    Progress Note  Randy GALEA Sr.  KGU:[id number] DOB: 12-Aug-1930 DOA: 06/21/2022 PCP: [name], NP     Brief Narrative:   Randy Boston Sr. is an 88 y.o. male past medical history significant for hypertension, CAD status post stent prostate cancer status post radical castration comes in complaining of chest pain that started the day prior to admission in the ED he was found in A-fib high sensitive troponins were mildly elevated, twelve-lead EKG showed ST segment elevation in the inferior leads with reciprocal changes in the precordial leads.    Assessment/Plan:   NSTEMI AFib with RVR Qtc prolongation -S/p cardiac cath on 8/15, showed RCA occlusion with collateralization , commended medical management  -Echo LVEF 35 to 62%, grade 2 diastolic dysfunction, moderate elevated pulmonary artery systolic pressure, left atrial size mildly dilated moderate to severe mitral valve regurg, moderate to severe tricuspid valve regurg, aortic valve regurg is moderate, greater than 50% respiration viability, right atrial pressure about 3 mmhg -he is currently chest pain-free. In sinus rhythm, off cardizem drip -off heparin, transitioned to eliquis, continue metoprolol, aspirin and statins.Nitroglycerin as needed. -Management per cardiology  Transient hypotension: Probably due to Cardizem which was discontinued was fluid resuscitated. Metoprolol was decreased, blood pressure has remained relatively stable.  Nausea and vomiting: Reports vomited 4 times prior to coming to the hospital No vomiting after that , appetite improved   chest x-ray showed no acute abnormalities. Abdominal x-ray showed no acute findings.  ab [place] changes might be related to congestion from heart failure, his gi symptom and right sided pain has resolved on 8/17    Chronic kidney disease stage II With a baseline creatinine of around 1.2. Cr improved , back to  baseline   Thrombocytopenia: No signs of bleeding. Improved   Hyperlipidemia: Ldl102, HDL 39 Continue statins.  Metastatic prostate cancer: Continue current  antiandrogen therapy,  follow-up with the [facility] as an outpatient oncology.  FTT, he  agreed to start therapy today and possible home tomorrow  DVT prophylaxis: heparin then eliquis Family Communication:wife over the phone [phone]) on 8/16    Code Status:     Code Status Orders  (From admission, onward)           Start     Ordered   06/21/22 0842  Full code  Continuous        06/21/22 0843           Code Status History     This patient has a current code status but no historical code status.         IV Access:   Peripheral IV   Procedures and diagnostic studies:   US Abdomen Limited  Result Date: 06/23/2022 CLINICAL DATA:  Nausea and vomiting. EXAM: ULTRASOUND ABDOMEN LIMITED RIGHT UPPER QUADRANT COMPARISON:  CT abdomen and pelvis 12/15/2020 FINDINGS: Gallbladder: No gallstones or wall thickening visualized. Gallbladder wall thickness is upper limits of normal. There is a small amount of pericholecystic fluid. No sonographic Murphy sign noted by sonographer. Common bile duct: Diameter: 3 mm Liver: No focal lesion identified. Within normal limits in parenchymal echogenicity. Portal vein is patent on color Doppler imaging with normal direction of blood flow towards the liver. Other: Right pleural effusion is present. Incidental finding of 4.0 by 3.8 x 3.0 cm right renal cyst with single thin septation. IMPRESSION: 1. No sonographic evidence for cholelithiasis or acute cholecystitis. 2. There is a small amount  of pericholecystic fluid of uncertain etiology. Please correlate clinically. 3. Right pleural effusion. 4. Right renal cyst.  No follow-up necessary. Electronically Signed   By: Ronney Asters M.D.   On: 06/23/2022 19:33     Medical Consultants:   None.   Subjective:    Randy Griffes  [place] Sr. appears stronger, denies pain, GI symptoms appear has resolved, report ate a good breakfast   Objective:    Vitals:   06/23/22 2019 06/23/22 2359 06/24/22 0500 06/24/22 1005  BP: 118/68 122/84 (!) 144/69 118/73  Pulse: 72 74 67 70  Resp: '18 17 18   '$ Temp: 98 F (36.7 C) 98.8 F (37.1 C) 97.9 F (36.6 C)   TempSrc: Oral Oral Oral   SpO2: 97% 99% 95%   Weight:   60.3 kg   Height:       SpO2: 95 % O2 Flow Rate (L/min): 2 L/min   Intake/Output Summary (Last 24 hours) at 06/24/2022 1737 Last data filed at 06/24/2022 1100 Gross per 24 hour  Intake --  Output 501 ml  Net -501 ml   Filed Weights   06/22/22 0500 06/23/22 0500 06/24/22 0500  Weight: 67.2 kg 63.7 kg 60.3 kg    Exam: General exam: frail, hard of hearing, NAD. Respiratory system: Good air movement and clear to auscultation. Cardiovascular system: S1 & S2 heard, RRR. No JVD. Gastrointestinal system: Nondistention ,nontender,  soft , +bs Extremities: No pedal edema. Skin: No rashes, lesions or ulcers Psychiatry: Judgement and insight appear normal. Mood & affect appropriate.    Data Reviewed:    Labs: Basic Metabolic Panel: Recent Labs  Lab 06/21/22 0538 06/21/22 1230 06/22/22 0319 06/24/22 0339  NA 141  --  141 139  K 3.7  --  4.2 3.6  CL 108  --  109 109  CO2 22  --  26 25  GLUCOSE 149*  --  117* 111*  BUN 23  --  16 14  CREATININE 1.26*  --  1.12 1.08  CALCIUM 9.2  --  8.7* 8.3*  MG 2.1 1.9  --  1.9   GFR Estimated Creatinine Clearance: 38.8 mL/min (by C-G formula based on SCr of 1.08 mg/dL). Liver Function Tests: Recent Labs  Lab 06/21/22 0538 06/24/22 0339  AST 21 20  ALT 12 12  ALKPHOS 57 48  BILITOT 0.6 0.3  PROT 5.9* 5.0*  ALBUMIN 3.8 2.9*   Recent Labs  Lab 06/21/22 0538  LIPASE 30   No results for input(s): "AMMONIA" in the last 168 hours. Coagulation profile No results for input(s): "INR", "PROTIME" in the last 168 hours. COVID-19 Labs  No results for  input(s): "DDIMER", "FERRITIN", "LDH", "CRP" in the last 72 hours.  No results found for: "SARSCOV2NAA"  CBC: Recent Labs  Lab 06/21/22 0538 06/22/22 0319 06/23/22 0549 06/24/22 0339  WBC 8.1 4.6 4.2 4.2  NEUTROABS 6.3  --   --   --   HGB 13.0 11.2* 11.1* 11.4*  HCT 38.5* 32.2* 32.4* 32.9*  MCV 94.1 92.8 93.1 91.9  PLT 147* 104* 117* 109*   Cardiac Enzymes: No results for input(s): "CKTOTAL", "CKMB", "CKMBINDEX", "TROPONINI" in the last 168 hours. BNP (last 3 results) No results for input(s): "PROBNP" in the last 8760 hours. CBG: No results for input(s): "GLUCAP" in the last 168 hours. D-Dimer: No results for input(s): "DDIMER" in the last 72 hours. Hgb A1c: No results for input(s): "HGBA1C" in the last 72 hours. Lipid Profile: No results for input(s): "CHOL", "HDL", "  [place]", "TRIG", "CHOLHDL", "LDLDIRECT" in the last 72 hours.  Thyroid function studies: No results for input(s): "TSH", "T4TOTAL", "T3FREE", "THYROIDAB" in the last 72 hours.  Invalid input(s): "FREET3"  Anemia work up: No results for input(s): "VITAMINB12", "FOLATE", "FERRITIN", "TIBC", "IRON", "RETICCTPCT" in the last 72 hours. Sepsis Labs: Recent Labs  Lab 06/21/22 0538 06/22/22 0319 06/23/22 0549 06/24/22 0339  WBC 8.1 4.6 4.2 4.2  LATICACIDVEN  --   --   --  1.0   Microbiology No results found for this or any previous visit (from the past 240 hour(s)).   Medications:    apalutamide  240 mg Oral Daily   apixaban  2.5 mg Oral BID   aspirin EC  81 mg Oral Daily   feeding supplement  237 mL Oral BID BM   losartan  25 mg Oral Daily   metoprolol tartrate  12.5 mg Oral BID   senna-docusate  1 tablet Oral QHS   sertraline  100 mg Oral Daily   simvastatin  40 mg Oral q1800   Continuous Infusions:      LOS: 1 day   Florencia Reasons MD PhD FACP [facility]  06/24/2022, 5:37 PM

## 2022-06-25 ENCOUNTER — Other Ambulatory Visit: Payer: Self-pay | Admitting: Cardiology

## 2022-06-25 ENCOUNTER — Telehealth: Payer: Self-pay

## 2022-06-25 ENCOUNTER — Other Ambulatory Visit (HOSPITAL_COMMUNITY): Payer: Self-pay

## 2022-06-25 DIAGNOSIS — I4891 Unspecified atrial fibrillation: Secondary | ICD-10-CM

## 2022-06-25 LAB — BASIC METABOLIC PANEL
Anion gap: 9 (ref 5–15)
BUN: 12 mg/dL (ref 8–23)
CO2: 21 mmol/L — ABNORMAL LOW (ref 22–32)
Calcium: 8.5 mg/dL — ABNORMAL LOW (ref 8.9–10.3)
Chloride: 109 mmol/L (ref 98–111)
Creatinine, Ser: 1.01 mg/dL (ref 0.61–1.24)
GFR, Estimated: >60 mL/min (ref >60)
Glucose, Bld: 113 mg/dL — ABNORMAL HIGH (ref 70–99)
Potassium: 3.6 mmol/L (ref 3.5–5.1)
Sodium: 139 mmol/L (ref 135–145)

## 2022-06-25 MED ORDER — APIXABAN 2.5 MG PO TABS
2.5000 mg | ORAL_TABLET | Freq: Two times a day (BID) | ORAL | 0 refills | Status: DC
  Filled 2022-06-25: qty 60, 30d supply, fill #0

## 2022-06-25 MED ORDER — ZOLPIDEM TARTRATE 10 MG PO TABS: 5.0000 mg | ORAL_TABLET | Freq: Every evening | ORAL | 0 refills | Status: DC | PRN

## 2022-06-25 MED ORDER — METOPROLOL TARTRATE 25 MG PO TABS
12.5000 mg | ORAL_TABLET | Freq: Two times a day (BID) | ORAL | 0 refills | Status: DC
  Filled 2022-06-25: qty 60, 60d supply, fill #0

## 2022-06-25 MED ORDER — LOSARTAN POTASSIUM 25 MG PO TABS
25.0000 mg | ORAL_TABLET | Freq: Every day | ORAL | 0 refills | Status: DC
  Filled 2022-06-25: qty 30, 30d supply, fill #0

## 2022-06-25 NOTE — Care Management Important Message (Signed)
Important Message  Patient Details  Name: Randy FERRICK Sr. MRN: [id number] Date of Birth: 12/08/1929   Medicare Important Message Given:  Yes     Addis Bennie 06/25/2022, 2:25 PM

## 2022-06-25 NOTE — Progress Notes (Addendum)
Progress Note  Patient Name: Randy BACCHI Sr. Date of Encounter: 06/25/2022  [facility] Cardiologist: Minus Breeding, MD   Subjective   Patient doing well this AM. Denies chest pain, sob, palpitations. Feels ready to go home. Has been walking in the halls and doing well   Inpatient Medications    Scheduled Meds:  apalutamide  240 mg Oral Daily   apixaban  2.5 mg Oral BID   aspirin EC  81 mg Oral Daily   feeding supplement  237 mL Oral BID BM   losartan  25 mg Oral Daily   metoprolol tartrate  12.5 mg Oral BID   senna-docusate  1 tablet Oral QHS   sertraline  100 mg Oral Daily   simvastatin  40 mg Oral q1800   Continuous Infusions:  PRN Meds: acetaminophen **OR** acetaminophen, albuterol, atropine, fentaNYL (SUBLIMAZE) injection, linaclotide, nitroGLYCERIN, ondansetron (ZOFRAN) IV, trimethobenzamide, zolpidem   Vital Signs    Vitals:   06/24/22 1005 06/24/22 2045 06/25/22 0454 06/25/22 0909  BP: 118/73 123/74 (!) 120/58 118/76  Pulse: 70 61 64 85  Resp:  '18 16 18  '$ Temp:  98.9 F (37.2 C) 98.8 F (37.1 C) 98 F (36.7 C)  TempSrc:  Oral Oral Oral  SpO2:  99% 97% 98%  Weight:      Height:        Intake/Output Summary (Last 24 hours) at 06/25/2022 0930 Last data filed at 06/25/2022 0400 Gross per 24 hour  Intake --  Output 751 ml  Net -751 ml      06/24/2022    5:00 AM 06/23/2022    5:00 AM 06/22/2022    5:00 AM  Last 3 Weights  Weight (lbs) 132 lb 15 oz 140 lb 8 oz 148 lb 2.4 oz  Weight (kg) 60.3 kg 63.73 kg 67.2 kg      Telemetry    Sinus rhythm with occasional PACs - Personally Reviewed  ECG     No new tracings since 8/15- Personally Reviewed  Physical Exam   GEN: No acute distress.  Laying flat in the bed  Neck: No JVD Cardiac: RRR, no murmurs, rubs, or gallops. Radial pulses 2+ bilaterally  Respiratory: Clear to auscultation bilaterally. GI: Soft, nontender, non-distended  MS: No edema; No deformity. Neuro:  Nonfocal  Psych: Normal  affect   Labs    High Sensitivity Troponin:   Recent Labs  Lab 06/21/22 0538 06/21/22 0756  TROPONINIHS 127* 1,525*     Chemistry Recent Labs  Lab 06/21/22 0538 06/21/22 1230 06/22/22 0319 06/24/22 0339 06/25/22 0403  NA 141  --  141 139 139  K 3.7  --  4.2 3.6 3.6  CL 108  --  109 109 109  CO2 22  --  26 25 21*  GLUCOSE 149*  --  117* 111* 113*  BUN 23  --  '16 14 12  '$ CREATININE 1.26*  --  1.12 1.08 1.01  CALCIUM 9.2  --  8.7* 8.3* 8.5*  MG 2.1 1.9  --  1.9  --   PROT 5.9*  --   --  5.0*  --   ALBUMIN 3.8  --   --  2.9*  --   AST 21  --   --  20  --   ALT 12  --   --  12  --   ALKPHOS 57  --   --  48  --   BILITOT 0.6  --   --  0.3  --  GFRNONAA 54*  --  >60 >60 >60  ANIONGAP 11  --  '6 5 9    '$ Lipids  Recent Labs  Lab 06/21/22 1230  CHOL 153  TRIG 59  HDL 39*  LDLCALC 102*  CHOLHDL 3.9    Hematology Recent Labs  Lab 06/22/22 0319 06/23/22 0549 06/24/22 0339  WBC 4.6 4.2 4.2  RBC 3.47* 3.48* 3.58*  HGB 11.2* 11.1* 11.4*  HCT 32.2* 32.4* 32.9*  MCV 92.8 93.1 91.9  MCH 32.3 31.9 31.8  MCHC 34.8 34.3 34.7  RDW 13.6 13.4 13.2  PLT 104* 117* 109*   Thyroid  Recent Labs  Lab 06/21/22 1230  TSH 2.324    BNPNo results for input(s): "BNP", "PROBNP" in the last 168 hours.  DDimer No results for input(s): "DDIMER" in the last 168 hours.   Radiology    US Abdomen Limited  Result Date: 06/23/2022 CLINICAL DATA:  Nausea and vomiting. EXAM: ULTRASOUND ABDOMEN LIMITED RIGHT UPPER QUADRANT COMPARISON:  CT abdomen and pelvis 12/15/2020 FINDINGS: Gallbladder: No gallstones or wall thickening visualized. Gallbladder wall thickness is upper limits of normal. There is a small amount of pericholecystic fluid. No sonographic Murphy sign noted by sonographer. Common bile duct: Diameter: 3 mm Liver: No focal lesion identified. Within normal limits in parenchymal echogenicity. Portal vein is patent on color Doppler imaging with normal direction of blood flow towards  the liver. Other: Right pleural effusion is present. Incidental finding of 4.0 by 3.8 x 3.0 cm right renal cyst with single thin septation. IMPRESSION: 1. No sonographic evidence for cholelithiasis or acute cholecystitis. 2. There is a small amount of pericholecystic fluid of uncertain etiology. Please correlate clinically. 3. Right pleural effusion. 4. Right renal cyst.  No follow-up necessary. Electronically Signed   By: Ronney Asters M.D.   On: 06/23/2022 19:33    Cardiac Studies   Echocardiogram 06/22/2022  1. Left ventricular ejection fraction, by estimation, is 35 to 40%. The  left ventricle has moderately decreased function. The left ventricle  demonstrates regional wall motion abnormalities (see scoring  diagram/findings for description). Left ventricular   diastolic parameters are consistent with Grade II diastolic dysfunction  (pseudonormalization).   2. Right ventricular systolic function is normal. The right ventricular  size is normal. There is moderately elevated pulmonary artery systolic  pressure. The estimated right ventricular systolic pressure is 25.3 mmHg.   3. Left atrial size was mildly dilated.   4. The mitral valve is abnormal. Moderate to severe mitral valve  regurgitation. No evidence of mitral stenosis.   5. Tricuspid valve regurgitation is moderate to severe.   6. The aortic valve is tricuspid. Aortic valve regurgitation is moderate.   7. The inferior vena cava is normal in size with greater than 50%  respiratory variability, suggesting right atrial pressure of 3 mmHg.   Conclusion(s)/Recommendation(s): Findings consistent with ischemic  cardiomyopathy.    Left Heart Catheterization 06/22/2022   Ramus lesion is 99% stenosed.   1st Sept lesion is 70% stenosed.   Ost Cx lesion is 50% stenosed.   Prox LAD to Mid LAD lesion is 40% stenosed.   Mid LAD lesion is 60% stenosed.   1st Mrg lesion is 95% stenosed.   Mid RCA to Dist RCA lesion is 100% stenosed.   Prox  RCA lesion is 40% stenosed.   Ost LM to Dist LM lesion is 20% stenosed.   Severe multivessel coronary calcification with 20% distal left main narrowing, diffuse calcification of the proximal to mid LAD  with 50% proximal stenosis and 60% mid stenosis; subtotal ostial stenosis of a small ramus intermediate vessel; 50% ostial circumflex stenosis with 95% calcified OM1 stenosis; and total occlusion of the mid RCA.  There is extensive collateralization to the PDA and PLA vessels of the RCA via septal and circumflex collaterals.   LVEDP 20 mmHg.   RECOMMENDATION: Medical therapy.  Suspect troponin elevation is due to recent RCA occlusion with good collateralization to the PDA and PLA system limiting infarct size.  Aggressive lipid-lowering therapy.   Diagnostic Dominance: Right   Patient Profile     88 y.o. male with a past medical history of HTN, HLD, CAD, prostate cancer s/p proctectomy who is being seen for the evaluation of atrial fibrillation, elevated troponin   Assessment & Plan    NSTEMI - Patient presented complaining of substernal chest pressure, improved with SL nitro. Found to be in afib with RVR at the time. 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 Improved on repeat EKG - hsTn 127>>1525 - Underwent LHC 8/15 that showed severe multivessel coronary calcification with 20% distal left main narrowing, diffuse calcification of the prox-mid LAD with 50% proximal stenosis and 60% mid stenosis, subtotal ostial stenosis of a small ramus intermediate vessel, 50% ostial circumflex stenosis and 95% calcified OM1 stenosis. There was total occlusion of the mid RCA with good collateral flow. Recommended medical management.  - Continue daily ASA, Zocor (increased dose to 40 mg daily due to CAD)  - On metoprolol 12.5 mg BID, BP and HR tolerating  -  Patient has a follow up appointment with general cardiology on 8/31   Ischemic Cardiomyopathy  Chronic HFrEF  - Echo this admission showed EF 35-40%, regional wall motion abnormalities, grade II diastolic dysfunction, normal RV systolic function  - Continue metoprolol 12.5 mg BID, losartan 25 mg daily   Paroxysmal Atrial Fibrillation  - Patient presented in Afib with RVR, was started on IV diltiazem, converted to NSR. Dilt drip has since been discontinued. - Per telemetry, patient is maintaining sinus rhythm  - Continue metoprolol 12.5 mg BID  - Now on eliquis 2.5 mg BID (dosed for age, weight)  - Will need home monitor for discharge    HTN  - Continue metoprolol 12.5 mg BID, losartan 25 mg daily  - BP stable    Moderate-severe MR  Moderate-severe TR  - Noted on echo this admission - Follow with echocardiograms as an outpatient      For questions or updates, please contact [facility] Please consult [email or web address] for contact info under        Signed, Margie Billet, PA-C  06/25/2022, 9:30 AM    History and all data above reviewed.  Patient examined.  I agree with the findings as above.  No chest pain.  No SOB.  Ambulated.  No further arrhythmias.  The patient exam reveals COR:RRR  ,  Lungs: Clear  ,  Abd: Positive bowel sounds, no rebound no guarding, Ext No edema  .  All available labs, radiology testing, previous records reviewed. Agree with documented assessment and plan.   Atrial fib:  Plan Eliquis without ASA.  I will plan an out patient four week monitor.  If he has no further atrial fib I might consider stopping the Eliquis and resuming ASA in the future.  Other meds as on  MAR.  CAD:  Medical management.  Continue current therapy.  LDL was above target.  I will follow up on the increased Zocor.   Randy [name]  10:10 AM  06/25/2022

## 2022-06-25 NOTE — Telephone Encounter (Signed)
30 day Event Monitor has been registered to be mailed to pt's home address.

## 2022-06-25 NOTE — Progress Notes (Signed)
Discharge instructions provided to patient. All medications, follow up appointments, and discharge instructions provided. IV out. Monitor off CCMD notified. Discharging to home with family.  Lamoyne Hessel R Ceniyah Thorp, RN  

## 2022-06-25 NOTE — Discharge Summary (Signed)
Discharge Summary  Randy [name] Sr. ZOX:[id number] DOB: 12-Apr-1930  PCP: [name], NP  Admit date: 06/21/2022 Discharge date: 06/25/2022      Time spent: 58mns, more than 50% time spent on coordination of care.   Recommendations for Outpatient Follow-up:  F/u with PCP within a week  for hospital discharge follow up, repeat cbc/bmp at follow up F/u with cardiology  New meds: Epixaban, losartan  Changed meds: Lopressor dose decreased  Stop  asa    Discharge Diagnoses:  Active Hospital Problems   Diagnosis Date Noted   Atrial fibrillation with RVR ([facility] 06/21/2022    Priority: 1.   Elevated troponin 06/21/2022    Priority: 2.   Chest pain 06/21/2022    Priority: 2.   History of CAD (coronary artery disease) 06/21/2022    Priority: 2.   Transient hypotension 06/21/2022    Priority: 3.   Renal insufficiency 06/21/2022    Priority: 4.   Thrombocytopenia ([facility] 06/21/2022    Priority: 5.   Hyperlipidemia 06/21/2022    Priority: 6.   Prostate cancer metastatic to multiple sites ([facility] 06/21/2022    Priority: 7.   NSTEMI (non-ST elevated myocardial infarction) ([facility] 06/23/2022    Resolved Hospital Problems  No resolved problems to display.    Discharge Condition: stable  Diet recommendation: heart healthy  Filed Weights   06/22/22 0500 06/23/22 0500 06/24/22 0500  Weight: 67.2 kg 63.7 kg 60.3 kg    History of present illness: ( per admitting MD Dr STamala Julian HPI: TGildardo GriffesPickard Sr. is a 88y.o. male with medical history significant of hypertension, hyperlipidemia, CAD s/p stent, metastatic prostate cancer s/p radical castration who presented with complaints of chest pain which started yesterday.  He reported having substernal chest pain with some complaints of shortness of breath.  He had taken 1 sublingual nitroglycerin at home prior to arrival.  Patient noted associated symptoms of palpitations, lightheadedness, headache, diaphoresis, nausea, and  vomiting.  He also describes epigastric discomfort as well and notes that chest pain moved over to the right side of his chest.  He did not note any lower extremity edema.  The patient and family present at bedside note he has no prior history of having a irregular heart rhythm.  He denies being sedentary and ambulates without need of assistance.   On admission to the emergency department patient was seen in atrial fibrillation with heart rates elevated into the 160s.  Labs significant for platelets 147, BUN 23, creatinine 1.26, high-sensitivity troponin 127.  Initial EKG gave concern for ST elevations in leads II, 3, and aVF.  Chest x-ray noted low lung volumes with mid left lung base atelectasis and mildly gas distended bowel left upper quadrant thought to be stomach.  Case had been discussed with cardiology who recommended repeating echocardiogram after obtaining rate control.  Patient has been started on a heparin and Cardizem drip.  Repeat EKG noted some improvement.  Patient also received 1 L of IV fluids, antiemetics, and fentanyl for pain.     Hospital Course:  Principal Problem:   Atrial fibrillation with RVR (HCC) Active Problems:   Elevated troponin   Chest pain   History of CAD (coronary artery disease)   Transient hypotension   Renal insufficiency   Thrombocytopenia (HCC)   Hyperlipidemia   Prostate cancer metastatic to multiple sites ([facility]   NSTEMI (non-ST elevated myocardial infarction) ([facility]   Assessment and Plan:  NSTEMI AFib with RVR Qtc prolongation -S/p cardiac cath  on 8/15, showed RCA occlusion with collateralization , commended medical management  -Echo LVEF 35 to 75%, grade 2 diastolic dysfunction, moderate elevated pulmonary artery systolic pressure, left atrial size mildly dilated moderate to severe mitral valve regurg, moderate to severe tricuspid valve regurg, aortic valve regurg is moderate, greater than 50% respiration viability, right atrial pressure about 3  mmhg -he is currently chest pain-free. In sinus rhythm -he is cleared to discharge home by cardiology on   eliquis without asa,  metoprolol, statins, fish oil. -cardiology plan for out patient four week monitor.  If he has no further atrial fib , may consider stopping the Eliquis and resuming ASA in the future  -Management per cardiology   Transient hypotension: Resolved   Nausea / vomiting/right upper quadrant pain: -Reports vomited 4 times prior to coming to the hospital - chest x-ray showed no acute abnormalities. -Abdominal x-ray showed no acute findings. - ab [place] changes might be related to congestion from heart failure, his gi symptom and right sided pain has resolved on 8/17, good appetite  -f/u with pcp and cardiology        Chronic kidney disease stage II Cr 1.26 on presentation, cr 1.01 at discharge Stable,  F/u with pcp     Thrombocytopenia: No signs of bleeding. Stable F/u with pcp   Hyperlipidemia: Ldl102, HDL 39 Continue statins, fish oil   Metastatic prostate cancer: Continue current  antiandrogen therapy,  follow-up with the [facility] as an outpatient oncology.   FTT, initially reports feeling weak , has improved, did well with PT prior to discharge, reports feeling back to baseline     Discharge Exam: BP 118/76 (BP Location: Left Arm)   Pulse 85   Temp 98 F (36.7 C) (Oral)   Resp 18   Ht '5\' 8"'$  (1.727 m)   Wt 60.3 kg   SpO2 98%   BMI 20.21 kg/m   General: NAD, hard of hearing ,aaox3 Cardiovascular: RRR Respiratory: normal respiratory effort     Discharge Instructions     (HEART FAILURE PATIENTS) Call MD:  Anytime you have any of the following symptoms: 1) 3 pound weight gain in 24 hours or 5 pounds in 1 week 2) shortness of breath, with or without a dry hacking cough 3) swelling in the hands, feet or stomach 4) if you have to sleep on extra pillows at night in order to breathe.   Complete by: As directed    Call MD for:  difficulty breathing,  headache or visual disturbances   Complete by: As directed    Call MD for:  extreme fatigue   Complete by: As directed    Call MD for:  persistant dizziness or light-headedness   Complete by: As directed    Call MD for:  persistant nausea and vomiting   Complete by: As directed    Diet - low sodium heart healthy   Complete by: As directed    Increase activity slowly   Complete by: As directed       Allergies as of 06/25/2022       Reactions   Nsaids         Medication List     STOP taking these medications    aspirin 325 MG tablet       TAKE these medications    acetaminophen 500 MG tablet Commonly known as: TYLENOL Take 500 mg by mouth every 6 (six) hours as needed for mild pain or moderate pain.   CALCIUM 500+D PO  Take 1 tablet by mouth daily.   Eliquis 2.5 MG Tabs tablet Generic drug: apixaban Take 1 tablet (2.5 mg total) by mouth 2 (two) times daily.   Erleada 60 MG tablet Generic drug: apalutamide Take 240 mg by mouth daily.   FISH OIL PO Take 1 capsule by mouth daily.   Linzess 145 MCG Caps capsule Generic drug: linaclotide Take 145 mcg by mouth daily as needed (constipation).   losartan 25 MG tablet Commonly known as: COZAAR Take 1 tablet (25 mg total) by mouth daily. Start taking on: June 26, 2022   metoprolol tartrate 25 MG tablet Commonly known as: LOPRESSOR Take 0.5 tablets (12.5 mg total) by mouth 2 (two) times daily. What changed:  medication strength how much to take   promethazine 25 MG tablet Commonly known as: PHENERGAN Take 25 mg by mouth every 8 (eight) hours as needed for vomiting or nausea.   sertraline 100 MG tablet Commonly known as: ZOLOFT Take 100 mg by mouth daily.   simvastatin 20 MG tablet Commonly known as: ZOCOR Take 20 mg by mouth daily at 6 PM.   zolpidem 10 MG tablet Commonly known as: AMBIEN Take 0.5 tablets (5 mg total) by mouth at bedtime as needed for sleep. What changed: how much to take        Allergies  Allergen Reactions   Nsaids       The results of significant diagnostics from this hospitalization (including imaging, microbiology, ancillary and laboratory) are listed below for reference.    Significant Diagnostic Studies: US Abdomen Limited  Result Date: 06/23/2022 CLINICAL DATA:  Nausea and vomiting. EXAM: ULTRASOUND ABDOMEN LIMITED RIGHT UPPER QUADRANT COMPARISON:  CT abdomen and pelvis 12/15/2020 FINDINGS: Gallbladder: No gallstones or wall thickening visualized. Gallbladder wall thickness is upper limits of normal. There is a small amount of pericholecystic fluid. No sonographic Murphy sign noted by sonographer. Common bile duct: Diameter: 3 mm Liver: No focal lesion identified. Within normal limits in parenchymal echogenicity. Portal vein is patent on color Doppler imaging with normal direction of blood flow towards the liver. Other: Right pleural effusion is present. Incidental finding of 4.0 by 3.8 x 3.0 cm right renal cyst with single thin septation. IMPRESSION: 1. No sonographic evidence for cholelithiasis or acute cholecystitis. 2. There is a small amount of pericholecystic fluid of uncertain etiology. Please correlate clinically. 3. Right pleural effusion. 4. Right renal cyst.  No follow-up necessary. Electronically Signed   By: Ronney Asters M.D.   On: 06/23/2022 19:33   CARDIAC CATHETERIZATION  Result Date: 06/22/2022   Ramus lesion is 99% stenosed.   1st Sept lesion is 70% stenosed.   Ost Cx lesion is 50% stenosed.   Prox LAD to Mid LAD lesion is 40% stenosed.   Mid LAD lesion is 60% stenosed.   1st Mrg lesion is 95% stenosed.   Mid RCA to Dist RCA lesion is 100% stenosed.   Prox RCA lesion is 40% stenosed.   Ost LM to Dist LM lesion is 20% stenosed. Severe multivessel coronary calcification with 20% distal left main narrowing, diffuse calcification of the proximal to mid LAD with 50% proximal stenosis and 60% mid stenosis; subtotal ostial stenosis of a small ramus  intermediate vessel; 50% ostial circumflex stenosis with 95% calcified OM1 stenosis; and total occlusion of the mid RCA.  There is extensive collateralization to the PDA and PLA vessels of the RCA via septal and circumflex collaterals. LVEDP 20 mmHg. RECOMMENDATION: Medical therapy.  Suspect troponin elevation is  due to recent RCA occlusion with good collateralization to the PDA and PLA system limiting infarct size.  Aggressive lipid-lowering therapy.   ECHOCARDIOGRAM COMPLETE  Result Date: 06/22/2022    ECHOCARDIOGRAM REPORT   Patient Name:   JAYLEEN AFONSO Sr. Date of Exam: 06/22/2022 Medical Rec #:  [id number]            Height:       68.0 in Accession #:    [id number]           Weight:       148.1 lb Date of Birth:  01-Oct-1930            BSA:          1.799 m Patient Age:    64 years             BP:           106/63 mmHg Patient Gender: M                    HR:           69 bpm. Exam Location:  Inpatient Procedure: 2D Echo, Cardiac Doppler, Color Doppler and Intracardiac            Opacification Agent Indications:    Atrial fibrillation/Chest pain  History:        Patient has no prior history of Echocardiogram examinations.                 CAD; Risk Factors:Hypertension and Dyslipidemia. Prostate Cancer                 ([place]).  Sonographer:    Eartha Inch Referring Phys: [id number] RONDELL A SMITH  Sonographer Comments: Suboptimal apical window. Image acquisition challenging due to patient body habitus and Image acquisition challenging due to respiratory motion. IMPRESSIONS  1. Left ventricular ejection fraction, by estimation, is 35 to 40%. The left ventricle has moderately decreased function. The left ventricle demonstrates regional wall motion abnormalities (see scoring diagram/findings for description). Left ventricular  diastolic parameters are consistent with Grade II diastolic dysfunction (pseudonormalization).  2. Right ventricular systolic function is normal. The right ventricular size is normal.  There is moderately elevated pulmonary artery systolic pressure. The estimated right ventricular systolic pressure is 37.8 mmHg.  3. Left atrial size was mildly dilated.  4. The mitral valve is abnormal. Moderate to severe mitral valve regurgitation. No evidence of mitral stenosis.  5. Tricuspid valve regurgitation is moderate to severe.  6. The aortic valve is tricuspid. Aortic valve regurgitation is moderate.  7. The inferior vena cava is normal in size with greater than 50% respiratory variability, suggesting right atrial pressure of 3 mmHg. Conclusion(s)/Recommendation(s): Findings consistent with ischemic cardiomyopathy. FINDINGS  Left Ventricle: Left ventricular ejection fraction, by estimation, is 35 to 40%. The left ventricle has moderately decreased function. The left ventricle demonstrates regional wall motion abnormalities. Definity contrast agent was given IV to delineate the left ventricular endocardial borders. The left ventricular internal cavity size was normal in size. There is no left ventricular hypertrophy. Left ventricular diastolic parameters are consistent with Grade II diastolic dysfunction (pseudonormalization).  LV Wall Scoring: The antero-lateral wall and posterior wall are hypokinetic. Right Ventricle: The right ventricular size is normal. No increase in right ventricular wall thickness. Right ventricular systolic function is normal. There is moderately elevated pulmonary artery systolic pressure. The tricuspid regurgitant velocity is 3.30 m/s, and with an assumed right atrial pressure of 8 mmHg,  the estimated right ventricular systolic pressure is 38.7 mmHg. Left Atrium: Left atrial size was mildly dilated. Right Atrium: Right atrial size was normal in size. Pericardium: Trivial pericardial effusion is present. Mitral Valve: The mitral valve is abnormal. Moderate to severe mitral valve regurgitation. No evidence of mitral valve stenosis. Tricuspid Valve: The tricuspid valve is grossly  normal. Tricuspid valve regurgitation is moderate to severe. No evidence of tricuspid stenosis. Aortic Valve: The aortic valve is tricuspid. Aortic valve regurgitation is moderate. Aortic regurgitation PHT measures 482 msec. Pulmonic Valve: The pulmonic valve was grossly normal. Pulmonic valve regurgitation is trivial. No evidence of pulmonic stenosis. Aorta: The aortic root and ascending aorta are structurally normal, with no evidence of dilitation. Venous: The inferior vena cava is normal in size with greater than 50% respiratory variability, suggesting right atrial pressure of 3 mmHg. IAS/Shunts: The atrial septum is grossly normal.  LEFT VENTRICLE PLAX 2D LVIDd:         5.00 cm     Diastology LVIDs:         3.60 cm     LV e' medial:    4.58 cm/s LV PW:         0.90 cm     LV E/e' medial:  18.1 LV IVS:        0.80 cm     LV e' lateral:   6.30 cm/s LVOT diam:     1.80 cm     LV E/e' lateral: 13.1 LV SV:         57 LV SV Index:   32 LVOT Area:     2.54 cm  LV Volumes (MOD) LV vol d, MOD A2C: 95.5 ml LV vol d, MOD A4C: 67.4 ml LV vol s, MOD A2C: 48.8 ml LV vol s, MOD A4C: 31.6 ml LV SV MOD A2C:     46.7 ml LV SV MOD A4C:     67.4 ml LV SV MOD BP:      38.7 ml RIGHT VENTRICLE             IVC RV S prime:     15.60 cm/s  IVC diam: 1.80 cm TAPSE (M-mode): 1.8 cm LEFT ATRIUM             Index        RIGHT ATRIUM           Index LA diam:        3.50 cm 1.95 cm/m   RA Area:     13.30 cm LA Vol (A2C):   73.2 ml 40.69 ml/m  RA Volume:   26.60 ml  14.79 ml/m LA Vol (A4C):   48.4 ml 26.90 ml/m LA Biplane Vol: 64.7 ml 35.96 ml/m  AORTIC VALVE LVOT Vmax:   90.20 cm/s LVOT Vmean:  64.500 cm/s LVOT VTI:    0.224 m AI PHT:      482 msec  AORTA Ao Root diam: 3.40 cm Ao Asc diam:  3.40 cm MITRAL VALVE                  TRICUSPID VALVE MV Area (PHT): 3.12 cm       TR Peak grad:   43.6 mmHg MV Decel Time: 243 msec       TR Mean grad:   31.0 mmHg MR Peak grad:    110.7 mmHg   TR Vmax:        330.00 cm/s MR Mean grad:    79.0 mmHg  TR Vmean:       270.0 cm/s MR Vmax:         526.00 cm/s MR Vmean:        424.0 cm/s   SHUNTS MR PISA:         0.57 cm     Systemic VTI:  0.22 m MR PISA Eff ROA: 4 mm        Systemic Diam: 1.80 cm MR PISA Radius:  0.30 cm MV E velocity: 82.80 cm/s MV A velocity: 94.30 cm/s MV E/A ratio:  0.88 Eleonore Chiquito MD Electronically signed by Eleonore Chiquito MD Signature Date/Time: 06/22/2022/10:08:23 AM    Final    DG Abd Portable 1V  Result Date: 06/21/2022 CLINICAL DATA:  Chest pain and shortness of breath EXAM: PORTABLE ABDOMEN - 1 VIEW COMPARISON:  Chest radiograph earlier today FINDINGS: Two views of the abdomen and pelvis. Gas within normal caliber stomach corresponds to the chest radiograph abnormality. Right hemidiaphragm elevation. No gross free intraperitoneal air. No gaseous distention of bowel loops. Pelvic node dissection. Cardiomegaly. Right hemidiaphragm elevation is mild. IMPRESSION: No acute findings. Electronically Signed   By: Abigail Miyamoto M.D.   On: 06/21/2022 14:18   DG Chest Portable 1 View  Result Date: 06/21/2022 CLINICAL DATA:  88 year old male with chest pain. Atrial fibrillation. EXAM: PORTABLE CHEST 1 VIEW COMPARISON:  Chest CT 03/12/2015 and earlier. FINDINGS: Portable AP semi upright view at 0605 hours. Lower lung volumes. Chronic tortuosity and calcification of the thoracic aorta. Stable cardiac size and mediastinal contours. Visualized tracheal air column is within normal limits. Mild patchy opacity at the left lung base most resembles atelectasis. Elsewhere Allowing for portable technique the lungs are clear. No pneumothorax. Partially visible at least moderate gas distended bowel in the left upper quadrant which is probably the stomach. No acute osseous abnormality identified. IMPRESSION: 1. Lower lung volumes with mild left lung base atelectasis. 2. Partially visible moderately gas distended bowel in the left upper quadrant, probably the stomach. 3.  Aortic Atherosclerosis  (ICD10-I70.0). Electronically Signed   By: Genevie Ann M.D.   On: 06/21/2022 06:13    Microbiology: No results found for this or any previous visit (from the past 240 hour(s)).   Labs: Basic Metabolic Panel: Recent Labs  Lab 06/21/22 0538 06/21/22 1230 06/22/22 0319 06/24/22 0339 06/25/22 0403  NA 141  --  141 139 139  K 3.7  --  4.2 3.6 3.6  CL 108  --  109 109 109  CO2 22  --  26 25 21*  GLUCOSE 149*  --  117* 111* 113*  BUN 23  --  '16 14 12  '$ CREATININE 1.26*  --  1.12 1.08 1.01  CALCIUM 9.2  --  8.7* 8.3* 8.5*  MG 2.1 1.9  --  1.9  --    Liver Function Tests: Recent Labs  Lab 06/21/22 0538 06/24/22 0339  AST 21 20  ALT 12 12  ALKPHOS 57 48  BILITOT 0.6 0.3  PROT 5.9* 5.0*  ALBUMIN 3.8 2.9*   Recent Labs  Lab 06/21/22 0538  LIPASE 30   No results for input(s): "AMMONIA" in the last 168 hours. CBC: Recent Labs  Lab 06/21/22 0538 06/22/22 0319 06/23/22 0549 06/24/22 0339  WBC 8.1 4.6 4.2 4.2  NEUTROABS 6.3  --   --   --   HGB 13.0 11.2* 11.1* 11.4*  HCT 38.5* 32.2* 32.4* 32.9*  MCV 94.1 92.8 93.1 91.9  PLT 147* 104* 117* 109*  Cardiac Enzymes: No results for input(s): "CKTOTAL", "CKMB", "CKMBINDEX", "TROPONINI" in the last 168 hours. BNP: BNP (last 3 results) No results for input(s): "BNP" in the last 8760 hours.  ProBNP (last 3 results) No results for input(s): "PROBNP" in the last 8760 hours.  CBG: No results for input(s): "GLUCAP" in the last 168 hours.  FURTHER DISCHARGE INSTRUCTIONS:   Get Medicines reviewed and adjusted: Please take all your medications with you for your next visit with your Primary MD   Laboratory/radiological data: Please request your Primary MD to go over all hospital tests and procedure/radiological results at the follow up, please ask your Primary MD to get all Hospital records sent to his/her office.   In some cases, they will be blood work, cultures and biopsy results pending at the time of your discharge. Please  request that your primary care M.D. goes through all the records of your hospital data and follows up on these results.   Also Note the following: If you experience worsening of your admission symptoms, develop shortness of breath, life threatening emergency, suicidal or homicidal thoughts you must seek medical attention immediately by calling 911 or calling your MD immediately  if symptoms less severe.   You must read complete instructions/literature along with all the possible adverse reactions/side effects for all the Medicines you take and that have been prescribed to you. Take any new Medicines after you have completely understood and accpet all the possible adverse reactions/side effects.    Do not drive when taking Pain medications or sleeping medications (Benzodaizepines)   Do not take more than prescribed Pain, Sleep and Anxiety Medications. It is not advisable to combine anxiety,sleep and pain medications without talking with your primary care practitioner   Special Instructions: If you have smoked or chewed Tobacco  in the last 2 yrs please stop smoking, stop any regular Alcohol  and or any Recreational drug use.   Wear Seat belts while driving.   Please note: You were cared for by a hospitalist during your hospital stay. Once you are discharged, your primary care physician will handle any further medical issues. Please note that NO REFILLS for any discharge medications will be authorized once you are discharged, as it is imperative that you return to your primary care physician (or establish a relationship with a primary care physician if you do not have one) for your post hospital discharge needs so that they can reassess your need for medications and monitor your lab values.     Signed:  Florencia Reasons MD, PhD, FACP  [facility] 06/25/2022, 11:23 PM

## 2022-06-28 ENCOUNTER — Telehealth: Payer: Self-pay | Admitting: Oncology

## 2022-06-28 ENCOUNTER — Inpatient Hospital Stay: Payer: Medicare Other

## 2022-06-28 ENCOUNTER — Ambulatory Visit: Payer: Self-pay

## 2022-06-28 NOTE — Telephone Encounter (Signed)
Per 8/21 sch msg, pt has been called and confirmed

## 2022-06-28 NOTE — Patient Outreach (Signed)
  Care Coordination TOC Note Transition Care Management Follow-up Telephone Call Date of discharge and from where: [facility] 06/21/22-06/25/22 How have you been since you were released from the hospital? Per patients spouse, he is doing very good. Any questions or concerns? No  Items Reviewed: Did the pt receive and understand the discharge instructions provided? Yes  Medications obtained and verified? Yes  Other? No  Any new allergies since your discharge? No  Dietary orders reviewed? Yes Do you have support at home? Yes   Home Care and Equipment/Supplies: Were home health services ordered? no If so, what is the name of the agency? N/A  Has the agency set up a time to come to the patient's home? not applicable Were any new equipment or medical supplies ordered?  No What is the name of the medical supply agency? N/A Were you able to get the supplies/equipment? yes Do you have any questions related to the use of the equipment or supplies? No  Functional Questionnaire: (I = Independent and D = Dependent) ADLs: D  Bathing/Dressing- D  Meal Prep- D  Eating- I  Maintaining continence- I  Transferring/Ambulation- I  Managing Meds- I  Follow up appointments reviewed:  PCP Hospital f/u appt confirmed? No   Specialist Hospital f/u appt confirmed? Yes  Scheduled to see Diona Browner, NP on 07/08/22 @ 1:55. Are transportation arrangements needed? No  If their condition worsens, is the pt aware to call PCP or go to the Emergency Dept.? Yes Was the patient provided with contact information for the PCP's office or ED? Yes Was to pt encouraged to call back with questions or concerns? Yes  SDOH assessments and interventions completed:   Yes  Care Coordination Interventions Activated:  Yes   Care Coordination Interventions:  PCP follow up appointment requested    Encounter Outcome:  Pt. Visit Completed

## 2022-06-29 ENCOUNTER — Encounter: Payer: Self-pay | Admitting: Oncology

## 2022-06-29 ENCOUNTER — Encounter: Payer: Self-pay | Admitting: Nurse Practitioner

## 2022-06-30 ENCOUNTER — Ambulatory Visit: Payer: Medicare Other | Admitting: Oncology

## 2022-07-02 ENCOUNTER — Other Ambulatory Visit: Payer: Self-pay

## 2022-07-02 ENCOUNTER — Inpatient Hospital Stay: Payer: Medicare Other

## 2022-07-02 ENCOUNTER — Inpatient Hospital Stay: Payer: Medicare Other | Attending: Oncology

## 2022-07-02 VITALS — BP 122/83 | HR 69 | Temp 98.1°F | Resp 18

## 2022-07-02 DIAGNOSIS — C61 Malignant neoplasm of prostate: Secondary | ICD-10-CM | POA: Insufficient documentation

## 2022-07-02 DIAGNOSIS — Z8546 Personal history of malignant neoplasm of prostate: Secondary | ICD-10-CM

## 2022-07-02 DIAGNOSIS — C7951 Secondary malignant neoplasm of bone: Secondary | ICD-10-CM | POA: Insufficient documentation

## 2022-07-02 DIAGNOSIS — C779 Secondary and unspecified malignant neoplasm of lymph node, unspecified: Secondary | ICD-10-CM | POA: Diagnosis not present

## 2022-07-02 LAB — CMP (CANCER CENTER ONLY)
ALT: 8 U/L (ref 0–44)
AST: 13 U/L — ABNORMAL LOW (ref 15–41)
Albumin: 4.3 g/dL (ref 3.5–5.0)
Alkaline Phosphatase: 60 U/L (ref 38–126)
Anion gap: 6 (ref 5–15)
BUN: 22 mg/dL (ref 8–23)
CO2: 27 mmol/L (ref 22–32)
Calcium: 9.8 mg/dL (ref 8.9–10.3)
Chloride: 106 mmol/L (ref 98–111)
Creatinine: 0.99 mg/dL (ref 0.61–1.24)
GFR, Estimated: >60 mL/min (ref >60)
Glucose, Bld: 93 mg/dL (ref 70–99)
Potassium: 4.3 mmol/L (ref 3.5–5.1)
Sodium: 139 mmol/L (ref 135–145)
Total Bilirubin: 0.5 mg/dL (ref 0.3–1.2)
Total Protein: 6.6 g/dL (ref 6.5–8.1)

## 2022-07-02 LAB — CBC WITH DIFFERENTIAL (CANCER CENTER ONLY)
Abs Immature Granulocytes: 0.03 10*3/uL (ref 0.00–0.07)
Basophils Absolute: 0 10*3/uL (ref 0.0–0.1)
Basophils Relative: 0 %
Eosinophils Absolute: 0.3 10*3/uL (ref 0.0–0.5)
Eosinophils Relative: 3 %
HCT: 37.1 % — ABNORMAL LOW (ref 39.0–52.0)
Hemoglobin: 12.9 g/dL — ABNORMAL LOW (ref 13.0–17.0)
Immature Granulocytes: 0 %
Lymphocytes Relative: 6 %
Lymphs Abs: 0.5 10*3/uL — ABNORMAL LOW (ref 0.7–4.0)
MCH: 31.9 pg (ref 26.0–34.0)
MCHC: 34.8 g/dL (ref 30.0–36.0)
MCV: 91.8 fL (ref 80.0–100.0)
Monocytes Absolute: 0.4 10*3/uL (ref 0.1–1.0)
Monocytes Relative: 5 %
Neutro Abs: 7.9 10*3/uL — ABNORMAL HIGH (ref 1.7–7.7)
Neutrophils Relative %: 86 %
Platelet Count: 188 10*3/uL (ref 150–400)
RBC: 4.04 MIL/uL — ABNORMAL LOW (ref 4.22–5.81)
RDW: 13.3 % (ref 11.5–15.5)
WBC Count: 9.2 10*3/uL (ref 4.0–10.5)
nRBC: 0 % (ref 0.0–0.2)

## 2022-07-02 MED ORDER — LEUPROLIDE ACETATE (4 MONTH) 30 MG ~~LOC~~ KIT
30.0000 mg | PACK | Freq: Once | SUBCUTANEOUS | Status: DC
  Filled 2022-07-02: qty 30

## 2022-07-02 NOTE — Patient Instructions (Signed)
Leuprolide Suspension for Injection (Prostate Cancer) What is this medication? LEUPROLIDE (loo PROE lide) reduces the symptoms of prostate cancer. It works by decreasing levels of the hormone testosterone in the body. This prevents prostate cancer cells from spreading or growing. This medicine may be used for other purposes; ask your health care provider or pharmacist if you have questions. COMMON BRAND NAME(S): Eligard, Fensolvi, Lupron Depot, Lupron Depot-Ped, Lutrate Depot, Viadur What should I tell my care team before I take this medication? They need to know if you have any of these conditions: Diabetes Heart disease Heart failure High or low levels of electrolytes, such as magnesium, potassium, or sodium in your blood Irregular heartbeat or rhythm Seizures An unusual or allergic reaction to leuprolide, other medications, foods, dyes, or preservatives Pregnant or trying to get pregnant Breast-feeding How should I use this medication? This medication is injected under the skin or into a muscle. It is given by your care team in a hospital or clinic setting. Talk to your care team about the use of this medication in children. Special care may be needed. Overdosage: If you think you have taken too much of this medicine contact a poison control center or emergency room at once. NOTE: This medicine is only for you. Do not share this medicine with others. What if I miss a dose? Keep appointments for follow-up doses. It is important not to miss your dose. Call your care team if you are unable to keep an appointment. What may interact with this medication? Do not take this medication with any of the following: Cisapride Dronedarone Ketoconazole Levoketoconazole Pimozide Thioridazine This medication may also interact with the following: Other medications that cause heart rhythm changes This list may not describe all possible interactions. Give your health care provider a list of all the  medicines, herbs, non-prescription drugs, or dietary supplements you use. Also tell them if you smoke, drink alcohol, or use illegal drugs. Some items may interact with your medicine. What should I watch for while using this medication? Visit your care team for regular checks on your progress. Tell your care team if your symptoms do not start to get better or if they get worse. This medication may increase blood sugar. The risk may be higher in patients who already have diabetes. Ask your care team what you can do to lower the risk of diabetes while taking this medication. This medication may cause infertility. Talk to your care team if you are concerned about your fertility. Heart attacks and strokes have been reported with the use of this medication. Get emergency help if you develop signs or symptoms of a heart attack or stroke. Talk to your care team about the risks and benefits of this medication. What side effects may I notice from receiving this medication? Side effects that you should report to your care team as soon as possible: Allergic reactions--skin rash, itching, hives, swelling of the face, lips, tongue, or throat Heart attack--pain or tightness in the chest, shoulders, arms, or jaw, nausea, shortness of breath, cold or clammy skin, feeling faint or lightheaded Heart rhythm changes--fast or irregular heartbeat, dizziness, feeling faint or lightheaded, chest pain, trouble breathing High blood sugar (hyperglycemia)--increased thirst or amount of urine, unusual weakness or fatigue, blurry vision Mood swings, irritability, hostility Seizures Stroke--sudden numbness or weakness of the face, arm, or leg, trouble speaking, confusion, trouble walking, loss of balance or coordination, dizziness, severe headache, change in vision Thoughts of suicide or self-harm, worsening mood, feelings of depression  Side effects that usually do not require medical attention (report to your care team if they  continue or are bothersome): Bone pain Change in sex drive or performance General discomfort and fatigue Hot flashes Muscle pain Pain, redness, or irritation at injection site Swelling of the ankles, hands, or feet This list may not describe all possible side effects. Call your doctor for medical advice about side effects. You may report side effects to FDA at 1-800-FDA-1088. Where should I keep my medication? This medication is given in a hospital or clinic. It will not be stored at home. NOTE: This sheet is a summary. It may not cover all possible information. If you have questions about this medicine, talk to your doctor, pharmacist, or health care provider.  2023 Elsevier/Gold Standard (2022-01-04 00:00:00)

## 2022-07-03 LAB — PROSTATE-SPECIFIC AG, SERUM (LABCORP): Prostate Specific Ag, Serum: 7 ng/mL — ABNORMAL HIGH (ref 0.0–4.0)

## 2022-07-05 ENCOUNTER — Inpatient Hospital Stay: Payer: Medicare Other | Admitting: Oncology

## 2022-07-05 ENCOUNTER — Inpatient Hospital Stay: Payer: Medicare Other

## 2022-07-06 NOTE — Progress Notes (Signed)
Established patient visit   Patient: Randy DACOSTA Sr.   DOB: [birth year]   88 y.o. Male  MRN: [id number] Visit Date: 07/07/2022   Chief Complaint  Patient presents with   Hospitalization Follow-up   Subjective    HPI  Hospital follow upand  -hospitalized from 06/21/2022 through 06/25/2022 due to  new onset a. Fib with RVR -had noticed rapid pulse. Called medic station and they had him call 911. EKG at home showed the atrial fibrillation with RVR and he was taken to the ER.  -added eliquis and losartan -decreased dose metoprolol  -needs to  have BMP and CBC checked today -cardiac cath done while hospitalized showing RCA occlusion -echo with reduced LVEF at 50%, diastolic dysfunction, pulmonary artery hypertension, severe  mitral valve regurgitation, moderate  to severe tricuspid regurgitation, moderate aortic valve regurgitation.  --will be managed per cardiology  -today, he states that he has some increased fatigue.   Medications: Outpatient Medications Prior to Visit  Medication Sig   acetaminophen (TYLENOL) 500 MG tablet Take 500 mg by mouth every 6 (six) hours as needed for mild pain or moderate pain.   apixaban (ELIQUIS) 2.5 MG TABS tablet Take 1 tablet (2.5 mg total) by mouth 2 (two) times daily.   ASPERCREME LIDOCAINE EX Apply 1 application topically daily as needed (pain).   Calcium Carb-Cholecalciferol (CALCIUM 1000 + D PO) Take 1 tablet by mouth daily.   ERLEADA 60 MG tablet Take 240 mg by mouth daily.   fish oil-omega-3 fatty acids 1000 MG capsule Take 1 g by mouth 2 (two) times daily.    fluticasone (FLONASE) 50 MCG/ACT nasal spray SPRAY 2 SPRAYS INTO EACH NOSTRIL EVERY DAY   furosemide (LASIX) 20 MG tablet Take 1 tablet (20 mg total) by mouth daily.   LINZESS 145 MCG CAPS capsule Take 145 mcg by mouth daily as needed (constipation).   losartan (COZAAR) 25 MG tablet Take 1 tablet (25 mg total) by mouth daily.   metoprolol tartrate (LOPRESSOR) 25 MG tablet Take 0.5  tablets (12.5 mg total) by mouth 2 (two) times daily.   niacin 500 MG tablet Take 500 mg by mouth at bedtime.   sertraline (ZOLOFT) 100 MG tablet Take 100 mg by mouth daily.   simvastatin (ZOCOR) 20 MG tablet Take 1 tablet (20 mg total) by mouth daily at 6 PM.   [DISCONTINUED] aspirin 325 MG EC tablet Take 325 mg by mouth every morning.   [DISCONTINUED] Calcium Carb-Cholecalciferol (CALCIUM 500+D PO) Take 1 tablet by mouth daily.   [DISCONTINUED] ERLEADA 60 MG tablet Take 4 tablets (240 mg total) by mouth daily at 12 noon.   [DISCONTINUED] linaclotide (LINZESS) 145 MCG CAPS capsule TAKE 1 CAPSULE (145 MCG TOTAL) BY MOUTH DAILY BEFORE BREAKFAST.   [DISCONTINUED] metoprolol tartrate (LOPRESSOR) 50 MG tablet Take 1 tablet by mouth twice daily   [DISCONTINUED] nitroGLYCERIN (NITROSTAT) 0.4 MG SL tablet Place 1 tablet (0.4 mg total) under the tongue every 5 (five) minutes as needed for chest pain.   [DISCONTINUED] Omega-3 Fatty Acids (FISH OIL PO) Take 1 capsule by mouth daily.   [DISCONTINUED] promethazine (PHENERGAN) 25 MG tablet TAKE 1 TABLET BY MOUTH EVERY 8 HOURS AS NEEDED FOR NAUSEA AND VOMITING   [DISCONTINUED] promethazine (PHENERGAN) 25 MG tablet Take 25 mg by mouth every 8 (eight) hours as needed for vomiting or nausea.   [DISCONTINUED] sertraline (ZOLOFT) 100 MG tablet TAKE 1 TABLET BY MOUTH ONCE DAILY. PATIENT NEEDS APPOINTMENT FOR REFILLS   [DISCONTINUED] simvastatin (ZOCOR)  20 MG tablet Take 20 mg by mouth daily at 6 PM.   [DISCONTINUED] zolpidem (AMBIEN) 10 MG tablet Take 1 tablet (10 mg total) by mouth at bedtime.   [DISCONTINUED] zolpidem (AMBIEN) 10 MG tablet Take 0.5 tablets (5 mg total) by mouth at bedtime as needed for sleep.   [DISCONTINUED] diphenoxylate-atropine (LOMOTIL) 2.5-0.025 MG tablet PLEASE SEE ATTACHED FOR DETAILED DIRECTIONS (Patient not taking: Reported on 11/05/2021)   No facility-administered medications prior to visit.    Review of Systems  Constitutional:   Positive for fatigue. Negative for activity change, chills and fever.  HENT:  Negative for congestion, postnasal drip, rhinorrhea, sinus pressure, sinus pain, sneezing and sore throat.   Eyes: Negative.   Respiratory:  Negative for cough, shortness of breath and wheezing.   Cardiovascular:  Negative for chest pain and palpitations.       New onset of atrial fibrillation   Gastrointestinal:  Negative for constipation, diarrhea, nausea and vomiting.  Endocrine: Negative for cold intolerance, heat intolerance, polydipsia and polyuria.  Genitourinary:  Negative for dysuria, frequency and urgency.  Musculoskeletal:  Negative for back pain and myalgias.  Skin:  Negative for rash.  Allergic/Immunologic: Negative for environmental allergies.  Neurological:  Negative for dizziness, weakness and headaches.  Psychiatric/Behavioral:  The patient is not nervous/anxious.     Last CBC Lab Results  Component Value Date   WBC 5.9 07/10/2022   HGB 13.3 07/10/2022   HCT 38.5 (L) 07/10/2022   MCV 92.3 07/10/2022   MCH 31.9 07/10/2022   RDW 13.0 07/10/2022   PLT 192 51/76/1607   Last metabolic panel Lab Results  Component Value Date   GLUCOSE 109 (H) 07/10/2022   NA 139 07/10/2022   K 3.6 07/10/2022   CL 105 07/10/2022   CO2 22 07/10/2022   BUN 19 07/10/2022   CREATININE 1.18 07/10/2022   GFRNONAA 59 (L) 07/10/2022   CALCIUM 9.6 07/10/2022   PROT 6.6 07/02/2022   ALBUMIN 4.3 07/02/2022   BILITOT 0.5 07/02/2022   ALKPHOS 60 07/02/2022   AST 13 (L) 07/02/2022   ALT 8 07/02/2022   ANIONGAP 12 07/10/2022       Objective     Today's Vitals   07/07/22 1351  BP: 102/68  Pulse: 68  SpO2: 97%  Weight: 136 lb 12.8 oz (62.1 kg)  Height: '5\' 8"'$  (1.727 m)   Body mass index is 20.8 kg/m.   BP Readings from Last 3 Encounters:  07/11/22 128/63  07/08/22 (!) 98/56  07/07/22 102/68    Wt Readings from Last 3 Encounters:  07/10/22 135 lb (61.2 kg)  07/08/22 136 lb (61.7 kg)  07/07/22  136 lb 12.8 oz (62.1 kg)    Physical Exam Vitals and nursing note reviewed.  Constitutional:      Appearance: Normal appearance. He is well-developed.  HENT:     Head: Normocephalic.  Eyes:     Pupils: Pupils are equal, round, and reactive to light.  Cardiovascular:     Rate and Rhythm: Normal rate and regular rhythm.     Pulses: Normal pulses.     Heart sounds: Normal heart sounds.  Pulmonary:     Effort: Pulmonary effort is normal.     Breath sounds: Normal breath sounds.  Abdominal:     Palpations: Abdomen is soft.  Musculoskeletal:        General: Normal range of motion.     Cervical back: Normal range of motion and neck supple.  Lymphadenopathy:  Cervical: No cervical adenopathy.  Skin:    General: Skin is warm and dry.     Capillary Refill: Capillary refill takes less than 2 seconds.  Neurological:     General: No focal deficit present.     Mental Status: He is alert and oriented to person, place, and time.  Psychiatric:        Mood and Affect: Mood normal.        Behavior: Behavior normal.        Thought Content: Thought content normal.        Judgment: Judgment normal.       Assessment & Plan    1. Hospital discharge follow-up -hospitalized from 06/21/2022 through 06/25/2022 due to  new onset a. Fib with RVR  2. Atrial fibrillation with RVR ([place]) Updated medications to reflect changes made during hospitalization. Patient to follow up with cardiology as scheduled.   3. Abnormal renal function Recheck cbc and bmp today    - Basic Metabolic Panel (BMET); Future - Basic Metabolic Panel (BMET)  4. Iron deficiency anemia, unspecified iron deficiency anemia type Check cbc for further evaluation.  - CBC; Future - CBC  5. Primary insomnia Patient may conitnue to take ambien 10 mg at bedtime as needed for insomnia.  - zolpidem (AMBIEN) 10 MG tablet; Take 1 tablet (10 mg total) by mouth at bedtime.  Dispense: 90 tablet; Refill: 0  6. Angina pectoris  [facility]) New prescription for nitroglycerin to use as needed for acute angina. Follow up with cardiology as scheduled.  - nitroGLYCERIN (NITROSTAT) 0.4 MG SL tablet; Place 1 tablet (0.4 mg total) under the tongue every 5 (five) minutes as needed for chest pain.  Dispense: 25 tablet; Refill: 1   Problem List Items Addressed This Visit       Cardiovascular and Mediastinum   Angina pectoris (HCC)   Relevant Medications   nitroGLYCERIN (NITROSTAT) 0.4 MG SL tablet   Atrial fibrillation with RVR (HCC)   Relevant Medications   nitroGLYCERIN (NITROSTAT) 0.4 MG SL tablet     Other   Insomnia   Relevant Medications   zolpidem (AMBIEN) 10 MG tablet   Abnormal renal function   Relevant Orders   CBC (Completed)   Iron deficiency anemia   Relevant Orders   Basic Metabolic Panel (BMET) (Completed)   Other Visit Diagnoses     Hospital discharge follow-up    -  Primary        Return in about 3 months (around 10/07/2022) for medicare wellness.         [name], NP  [facility] [phone] (phone) [phone] (fax)  [place]

## 2022-07-06 NOTE — Progress Notes (Unsigned)
Office Visit    Patient Name: Randy ALMQUIST Sr. Date of Encounter: 07/08/2022  Primary Care Provider:  [name], NP Primary Cardiologist:  Minus Breeding, MD Primary Electrophysiologist: None  Chief Complaint    Randy Boston Sr. is a 88 y.o. male with PMH of HTN, HLD, CAD, prostate CA s/p prostatectomy, ischemic cardiomyopathy, who presents for hospital follow-up of recent new onset AF with RVR.  Past Medical History    Past Medical History:  Diagnosis Date   Acute MI ([place])    Arthritis    CAD (coronary artery disease)    Coronary artery disease    GERD (gastroesophageal reflux disease)    HLD (hyperlipidemia)    HTN (hypertension)    Hyperlipidemia    Hypertension    Prostate cancer ([place])    Wears glasses    Past Surgical History:  Procedure Laterality Date   CORONARY ANGIOPLASTY     11/30/94 (Dr. Daneen Schick): Mild ant/anteroapical hypokinesis (known ant MI '94), EF 60%. 50-60%pLAD, 60% RI, 50% oRCA, 90% OM1 (s/p PTCA '96)   KNEE ARTHROSCOPY Right 03/01/2018   Procedure: ARTHROSCOPY KNEE;  Surgeon: Frederik Pear, MD;  Location: [place];  Service: Orthopedics;  Laterality: Right;  debridement of medial and lateral meniscal tears   LEFT HEART CATH AND CORONARY ANGIOGRAPHY N/A 06/22/2022   Procedure: LEFT HEART CATH AND CORONARY ANGIOGRAPHY;  Surgeon: Troy Sine, MD;  Location: [place] [facility];  Service: Cardiovascular;  Laterality: N/A;   Lumbar back surgery      Allergies  Allergies  Allergen Reactions   Nsaids     Had a heart attack, so can not take NSAIDS    History of Present Illness    Randy JASMIN Sr. is a 88 year old male with the above-mentioned past medical history who presents today for follow-up of AF with RVR.  Randy [name] was admitted 06/21/2022 with complaint of sternal chest pain with dizziness.  He was given sublingual nitroglycerin and EMS was called and patient was found to have AF with rapid ventricular rate.  Repeat EKG  in ED revealed ST elevation in inferior leads with ST depression he was started on Cardizem drip and IV heparin.  2D echo was completed with EF of 35-40%, moderately decreased LV function, RWMA, grade 2 DD RV function, moderate to severe MV regurgitation and moderate-severe TR regurgitation.  Left heart catheter completed and revealed severe multivessel calcifications with diffuse calcification of the proximal to mid LAD with 50% proximal stenosis and 60% mid stenosis; subtotal ostial stenosis of a small ramus intermediate vessel; 50% ostial circumflex stenosis with 95% calcified OM1 stenosis; and total occlusion of the mid RCA.  Medical therapy was recommended.  Randy [name] presents for follow-up today with his son. Since last being seen he reports that he has been feeling weak.  Today his blood pressure was 98/56.  His son reports that his blood pressures have been on the low side since coming home from the hospital.  He also reports that Randy [name] is not drinking enough water daily while taking his Lasix.  In reviewing his weights from the last few days he has remained steady and is euvolemic on examination. Patient denies chest pain, palpitations, dyspnea, PND, orthopnea, nausea, vomiting, dizziness, syncope, edema, weight gain, or early satiety.  Home Medications    Current Outpatient Medications  Medication Sig Dispense Refill   acetaminophen (TYLENOL) 500 MG tablet Take 500 mg by mouth every 6 (six) hours as  needed for mild pain or moderate pain.     apixaban (ELIQUIS) 2.5 MG TABS tablet Take 1 tablet (2.5 mg total) by mouth 2 (two) times daily. 60 tablet 0   ASPERCREME LIDOCAINE EX Apply 1 application topically daily as needed (pain).     Calcium Carb-Cholecalciferol (CALCIUM 1000 + D PO) Take 1 tablet by mouth daily.     ERLEADA 60 MG tablet Take 240 mg by mouth daily.     fish oil-omega-3 fatty acids 1000 MG capsule Take 1 g by mouth 2 (two) times daily.      fluticasone (FLONASE) 50  MCG/ACT nasal spray SPRAY 2 SPRAYS INTO EACH NOSTRIL EVERY DAY 48 mL 1   furosemide (LASIX) 20 MG tablet Take 1 tablet (20 mg total) by mouth daily. 5 tablet 0   LINZESS 145 MCG CAPS capsule Take 145 mcg by mouth daily as needed (constipation).     losartan (COZAAR) 25 MG tablet Take 1 tablet (25 mg total) by mouth daily. 30 tablet 0   metoprolol tartrate (LOPRESSOR) 25 MG tablet Take 0.5 tablets (12.5 mg total) by mouth 2 (two) times daily. 60 tablet 0   niacin 500 MG tablet Take 500 mg by mouth at bedtime.     nitroGLYCERIN (NITROSTAT) 0.4 MG SL tablet Place 1 tablet (0.4 mg total) under the tongue every 5 (five) minutes as needed for chest pain. 25 tablet 1   promethazine (PHENERGAN) 25 MG tablet Take 25 mg by mouth every 8 (eight) hours as needed for vomiting or nausea.     sertraline (ZOLOFT) 100 MG tablet Take 100 mg by mouth daily.     simvastatin (ZOCOR) 20 MG tablet Take 1 tablet (20 mg total) by mouth daily at 6 PM. 90 tablet 3   zolpidem (AMBIEN) 10 MG tablet Take 1 tablet (10 mg total) by mouth at bedtime. 90 tablet 0   No current facility-administered medications for this visit.     Review of Systems  Please see the history of present illness.    (+) Tiredness (+) Weakness  All other systems reviewed and are otherwise negative except as noted above.  Physical Exam    Wt Readings from Last 3 Encounters:  07/08/22 136 lb (61.7 kg)  07/07/22 136 lb 12.8 oz (62.1 kg)  06/24/22 132 lb 15 oz (60.3 kg)   VS: Vitals:   07/08/22 1454  BP: (!) 98/56  Pulse: 80  SpO2: 98%  ,Body mass index is 21.95 kg/m.  Constitutional:      Appearance: Healthy appearance. Not in distress.  Neck:     Vascular: JVD normal.  Pulmonary:     Effort: Pulmonary effort is normal.     Breath sounds: No wheezing. No rales. Diminished in the bases Cardiovascular:     Normal rate. Regular rhythm. Normal S1. Normal S2.      Murmurs: There is no murmur.  Edema:    Peripheral edema absent.   Abdominal:     Palpations: Abdomen is soft non tender. There is no hepatomegaly.  Skin:    General: Skin is warm and dry.  Neurological:     General: No focal deficit present.     Mental Status: Alert and oriented to person, place and time.     Cranial Nerves: Cranial nerves are intact.  EKG/LABS/Other Studies Reviewed    ECG personally reviewed by me today -none completed today   Lab Results  Component Value Date   WBC 9.2 07/02/2022   HGB 12.9 (  L) 07/02/2022   HCT 37.1 (L) 07/02/2022   MCV 91.8 07/02/2022   PLT 188 07/02/2022   Lab Results  Component Value Date   CREATININE 0.99 07/02/2022   BUN 22 07/02/2022   NA 139 07/02/2022   K 4.3 07/02/2022   CL 106 07/02/2022   CO2 27 07/02/2022   Lab Results  Component Value Date   ALT 8 07/02/2022   AST 13 (L) 07/02/2022   ALKPHOS 60 07/02/2022   BILITOT 0.5 07/02/2022   Lab Results  Component Value Date   CHOL 153 06/21/2022   HDL 39 (L) 06/21/2022   LDLCALC 102 (H) 06/21/2022   LDLDIRECT 92.0 02/03/2021   TRIG 59 06/21/2022   CHOLHDL 3.9 06/21/2022    Lab Results  Component Value Date   HGBA1C 5.7 01/27/2017    Assessment & Plan    1.  NSTEMI -Underwent LHC 8/15 that showed severe multivessel coronary calcification with total occlusion of mid RCA with collateral flow recommended medical management Current GDMT with Zocor 20 mg daily, metoprolol 12.5 mg twice daily, currently not on ASA due to Eliquis and new onset AF -Patient reports no angina or equivalent since his discharge  2.  AF with RVR: -New onset currently rate controlled with rate of 80 -Patient denies blood in his urine or stool -Continue Eliquis 2.5 mg twice daily -Continue metoprolol 12.5 mg twice daily  3.  Ischemic cardiomyopathy/chronic HFrEF: -echo with reduced LVEF at 12%, diastolic dysfunction, pulmonary artery hypertension, severe  mitral valve regurgitation, moderate  to severe tricuspid regurgitation, moderate aortic valve  regurgitation -Patient is euvolemic on examination and is experiencing hypotension due to hypovolemia. -We will have him hold his Lasix for a few days to see if his blood pressures increase -Asked him to pay close attention to his weights over the next few days   4.  Hypertension: -Blood pressure today was 98/56 -He was advised to increase his fluid intake and check his blood pressures over the next 2 weeks -If blood pressures remain low we will make adjustments to his other antihypertensive medications  5.  Moderate/severe MR and TR: -mitral valve regurgitation, moderate  to severe tricuspid regurgitation, moderate aortic valve regurgitation -Continue blood pressure control and fluid volume management Low sodium diet, fluid restriction <2L, and daily weights encouraged. Educated to contact our office for weight gain of 2 lbs overnight or 5 lbs in one week.      Disposition: Follow-up with Minus Breeding, MD or APP in 2 months    Medication Adjustments/Labs and Tests Ordered: Current medicines are reviewed at length with the patient today.  Concerns regarding medicines are outlined above.   Signed, Mable Fill, Marissa Nestle, NP 07/08/2022, 3:40 PM Randy [name]

## 2022-07-07 ENCOUNTER — Ambulatory Visit (INDEPENDENT_AMBULATORY_CARE_PROVIDER_SITE_OTHER): Payer: Medicare Other | Admitting: Nurse Practitioner

## 2022-07-07 ENCOUNTER — Encounter: Payer: Self-pay | Admitting: Nurse Practitioner

## 2022-07-07 VITALS — BP 102/68 | HR 68 | Ht 68.0 in | Wt 136.8 lb

## 2022-07-07 DIAGNOSIS — I4891 Unspecified atrial fibrillation: Secondary | ICD-10-CM | POA: Diagnosis not present

## 2022-07-07 DIAGNOSIS — I209 Angina pectoris, unspecified: Secondary | ICD-10-CM

## 2022-07-07 DIAGNOSIS — F5101 Primary insomnia: Secondary | ICD-10-CM

## 2022-07-07 DIAGNOSIS — D509 Iron deficiency anemia, unspecified: Secondary | ICD-10-CM

## 2022-07-07 DIAGNOSIS — N289 Disorder of kidney and ureter, unspecified: Secondary | ICD-10-CM

## 2022-07-07 DIAGNOSIS — Z09 Encounter for follow-up examination after completed treatment for conditions other than malignant neoplasm: Secondary | ICD-10-CM

## 2022-07-07 MED ORDER — ZOLPIDEM TARTRATE 10 MG PO TABS: 10.0000 mg | ORAL_TABLET | Freq: Every day | ORAL | 0 refills | Status: DC

## 2022-07-07 MED ORDER — NITROGLYCERIN 0.4 MG SL SUBL: 0.4000 mg | SUBLINGUAL_TABLET | SUBLINGUAL | 1 refills | Status: DC | PRN

## 2022-07-08 ENCOUNTER — Ambulatory Visit: Payer: Medicare Other | Admitting: Nurse Practitioner

## 2022-07-08 ENCOUNTER — Other Ambulatory Visit: Payer: Medicare Other

## 2022-07-08 ENCOUNTER — Encounter: Payer: Self-pay | Admitting: Nurse Practitioner

## 2022-07-08 ENCOUNTER — Ambulatory Visit (INDEPENDENT_AMBULATORY_CARE_PROVIDER_SITE_OTHER): Payer: Medicare Other

## 2022-07-08 ENCOUNTER — Ambulatory Visit: Payer: Medicare Other | Attending: Nurse Practitioner | Admitting: Nurse Practitioner

## 2022-07-08 VITALS — BP 98/56 | HR 80 | Ht 66.0 in | Wt 136.0 lb

## 2022-07-08 DIAGNOSIS — I10 Essential (primary) hypertension: Secondary | ICD-10-CM

## 2022-07-08 DIAGNOSIS — I214 Non-ST elevation (NSTEMI) myocardial infarction: Secondary | ICD-10-CM | POA: Diagnosis not present

## 2022-07-08 DIAGNOSIS — I4891 Unspecified atrial fibrillation: Secondary | ICD-10-CM | POA: Diagnosis not present

## 2022-07-08 DIAGNOSIS — N289 Disorder of kidney and ureter, unspecified: Secondary | ICD-10-CM | POA: Diagnosis not present

## 2022-07-08 DIAGNOSIS — D509 Iron deficiency anemia, unspecified: Secondary | ICD-10-CM | POA: Diagnosis not present

## 2022-07-08 DIAGNOSIS — I340 Nonrheumatic mitral (valve) insufficiency: Secondary | ICD-10-CM | POA: Diagnosis not present

## 2022-07-08 NOTE — Patient Instructions (Addendum)
Medication Instructions:  Hold your lasix for 1 week.  Push FLUIDS to help with the blood pressure.   Your physician has requested that you regularly monitor and record your blood pressure readings at home. Please use the same machine at the same time of day to check your readings and record them to bring to your follow-up visit.   Please monitor blood pressures and keep a log of your readings 1-2 weeks and send in a mychart with the readings.    Make sure to check 2 hours after your medications.    AVOID these things for 30 minutes before checking your blood pressure: No Drinking caffeine. No Drinking alcohol. No Eating. No Smoking. No Exercising.   Five minutes before checking your blood pressure: Pee. Sit in a dining chair. Avoid sitting in a soft couch or armchair. Be quiet. Do not talk  *If you need a refill on your cardiac medications before your next appointment, please call your pharmacy*   Lab Work: None ordered  If you have labs (blood work) drawn today and your tests are completely normal, you will receive your results only by: [place] (if you have MyChart) OR A paper copy in the mail If you have any lab test that is abnormal or we need to change your treatment, we will call you to review the results.   Testing/Procedures: None ordered   Follow-Up: At [facility], you and your health needs are our priority.  As part of our continuing mission to provide you with exceptional heart care, we have created designated Provider Care Teams.  These Care Teams include your primary Cardiologist (physician) and Advanced Practice Providers (APPs -  Physician Assistants and Nurse Practitioners) who all work together to provide you with the care you need, when you need it.  We recommend signing up for the patient portal called "MyChart".  Sign up information is provided on this After Visit Summary.  MyChart is used to connect with patients for Virtual Visits  (Telemedicine).  Patients are able to view lab/test results, encounter notes, upcoming appointments, etc.  Non-urgent messages can be sent to your provider as well.   To learn more about what you can do with MyChart, go to [email or web address].    Your next appointment:   2 month(s)  09/06/22 ARRIVE AT 3:20  The format for your next appointment:   In Person  Provider:   Ambrose Pancoast, NP         Other Instructions   Important Information About Sugar

## 2022-07-08 NOTE — Progress Notes (Unsigned)
[employer] Event monitor serial # [id number] mailed to patient 8/18, applied to patient in office 07/08/22.   Dr. Percival Spanish to read.

## 2022-07-09 LAB — BASIC METABOLIC PANEL
BUN/Creatinine Ratio: 13 (ref 10–24)
BUN: 14 mg/dL (ref 10–36)
CO2: 22 mmol/L (ref 20–29)
Calcium: 8.9 mg/dL (ref 8.6–10.2)
Chloride: 105 mmol/L (ref 96–106)
Creatinine, Ser: 1.05 mg/dL (ref 0.76–1.27)
Glucose: 122 mg/dL — ABNORMAL HIGH (ref 70–99)
Potassium: 4.3 mmol/L (ref 3.5–5.2)
Sodium: 143 mmol/L (ref 134–144)
eGFR: 67 mL/min/{1.73_m2} (ref >59)

## 2022-07-09 LAB — CBC
Hematocrit: 39.3 % (ref 37.5–51.0)
Hemoglobin: 13 g/dL (ref 13.0–17.7)
MCH: 30.9 pg (ref 26.6–33.0)
MCHC: 33.1 g/dL (ref 31.5–35.7)
MCV: 93 fL (ref 79–97)
Platelets: 187 10*3/uL (ref 150–450)
RBC: 4.21 x10E6/uL (ref 4.14–5.80)
RDW: 13.4 % (ref 11.6–15.4)
WBC: 4.6 10*3/uL (ref 3.4–10.8)

## 2022-07-10 ENCOUNTER — Emergency Department (HOSPITAL_COMMUNITY): Payer: Medicare Other

## 2022-07-10 ENCOUNTER — Other Ambulatory Visit: Payer: Self-pay

## 2022-07-10 ENCOUNTER — Emergency Department (HOSPITAL_COMMUNITY)
Admission: EM | Admit: 2022-07-10 | Discharge: 2022-07-11 | Disposition: A | Discharge status: Home / Self Care | Payer: Medicare Other | Attending: Emergency Medicine | Admitting: Emergency Medicine

## 2022-07-10 DIAGNOSIS — W19XXXA Unspecified fall, initial encounter: Secondary | ICD-10-CM | POA: Diagnosis not present

## 2022-07-10 DIAGNOSIS — Z23 Encounter for immunization: Secondary | ICD-10-CM | POA: Diagnosis not present

## 2022-07-10 DIAGNOSIS — S060X9A Concussion with loss of consciousness of unspecified duration, initial encounter: Secondary | ICD-10-CM | POA: Diagnosis not present

## 2022-07-10 DIAGNOSIS — I2510 Atherosclerotic heart disease of native coronary artery without angina pectoris: Secondary | ICD-10-CM | POA: Diagnosis not present

## 2022-07-10 DIAGNOSIS — W01198A Fall on same level from slipping, tripping and stumbling with subsequent striking against other object, initial encounter: Secondary | ICD-10-CM | POA: Insufficient documentation

## 2022-07-10 DIAGNOSIS — Z79899 Other long term (current) drug therapy: Secondary | ICD-10-CM | POA: Diagnosis not present

## 2022-07-10 DIAGNOSIS — R0689 Other abnormalities of breathing: Secondary | ICD-10-CM | POA: Diagnosis not present

## 2022-07-10 DIAGNOSIS — S060X1A Concussion with loss of consciousness of 30 minutes or less, initial encounter: Secondary | ICD-10-CM | POA: Diagnosis not present

## 2022-07-10 DIAGNOSIS — Z743 Need for continuous supervision: Secondary | ICD-10-CM | POA: Diagnosis not present

## 2022-07-10 DIAGNOSIS — Z7901 Long term (current) use of anticoagulants: Secondary | ICD-10-CM | POA: Diagnosis not present

## 2022-07-10 DIAGNOSIS — I10 Essential (primary) hypertension: Secondary | ICD-10-CM | POA: Diagnosis not present

## 2022-07-10 DIAGNOSIS — S0990XA Unspecified injury of head, initial encounter: Secondary | ICD-10-CM | POA: Diagnosis not present

## 2022-07-10 MED ORDER — TETANUS-DIPHTH-ACELL PERTUSSIS 5-2.5-18.5 LF-MCG/0.5 IM SUSY
0.5000 mL | PREFILLED_SYRINGE | Freq: Once | INTRAMUSCULAR | Status: AC
  Administered 2022-07-11: 0.5 mL via INTRAMUSCULAR
  Filled 2022-07-10: qty 0.5

## 2022-07-10 NOTE — Progress Notes (Signed)
   07/10/22 2310  Clinical Encounter Type  Visited With Patient not available  Visit Type Initial;Trauma  Referral From Nurse  Consult/Referral To Chaplain   Chaplain responded to a level two trauma. Patient was under the care of the medical team. No family is present. If a chaplain is requested someone will respond.   Danice Goltz [facility]  [phone]

## 2022-07-10 NOTE — Progress Notes (Signed)
Orthopedic Tech Progress Note Patient Details:  FREDIE MAJANO Sr. Jul 10, 1930 [id number]  Patient ID: [name], male   DOB: [birth year], 88 y.o.   MRN: [id number] I attended trauma page. Karolee Stamps 07/10/2022, 11:58 PM

## 2022-07-10 NOTE — ED Provider Notes (Signed)
[place] EMERGENCY DEPARTMENT Provider Note   CSN: [id number] Arrival date & time: 07/10/22  2309     History {Add pertinent medical, surgical, social history, OB history to HPI:1} Chief Complaint  Patient presents with   Randy Constable Sr. is a 88 y.o. male.   Fall  Patient with history of CAD, atrial fibrillation on Eliquis presents after fall. Patient reports he was standing up from recliner when he fell forward striking his head and face. No LOC. He reports mild headache and abrasions to his face and nose.  Denies any new dental trauma.  No recent fevers or vomiting.  No chest pain. Patient has been ambulating since the event No other pain complaints     Home Medications Prior to Admission medications   Medication Sig Start Date End Date Taking? Authorizing Provider  acetaminophen (TYLENOL) 500 MG tablet Take 500 mg by mouth every 6 (six) hours as needed for mild pain or moderate pain.    [provider]  apixaban (ELIQUIS) 2.5 MG TABS tablet Take 1 tablet (2.5 mg total) by mouth 2 (two) times daily. 06/25/22   Florencia Reasons, MD  ASPERCREME LIDOCAINE EX Apply 1 application topically daily as needed (pain).    [provider]  Calcium Carb-Cholecalciferol (CALCIUM 1000 + D PO) Take 1 tablet by mouth daily.    [provider]  ERLEADA 60 MG tablet Take 240 mg by mouth daily. 05/14/22   [provider]  fish oil-omega-3 fatty acids 1000 MG capsule Take 1 g by mouth 2 (two) times daily.     [provider]  fluticasone (FLONASE) 50 MCG/ACT nasal spray SPRAY 2 SPRAYS INTO EACH NOSTRIL EVERY DAY 02/18/21   [name], MD  furosemide (LASIX) 20 MG tablet Take 1 tablet (20 mg total) by mouth daily. 11/05/21   Nafziger, Tommi Rumps, NP  LINZESS 145 MCG CAPS capsule Take 145 mcg by mouth daily as needed (constipation). 01/19/22   [provider]  losartan (COZAAR) 25 MG tablet Take 1 tablet (25 mg total)  by mouth daily. 06/26/22   Florencia Reasons, MD  metoprolol tartrate (LOPRESSOR) 25 MG tablet Take 0.5 tablets (12.5 mg total) by mouth 2 (two) times daily. 06/25/22   Florencia Reasons, MD  niacin 500 MG tablet Take 500 mg by mouth at bedtime.    [provider]  nitroGLYCERIN (NITROSTAT) 0.4 MG SL tablet Place 1 tablet (0.4 mg total) under the tongue every 5 (five) minutes as needed for chest pain. 07/07/22   [name], NP  promethazine (PHENERGAN) 25 MG tablet Take 25 mg by mouth every 8 (eight) hours as needed for vomiting or nausea. 05/18/22   [provider]  sertraline (ZOLOFT) 100 MG tablet Take 100 mg by mouth daily. 06/15/22   [provider]  simvastatin (ZOCOR) 20 MG tablet Take 1 tablet (20 mg total) by mouth daily at 6 PM. 01/12/22   Boscia, Greer Ee, NP  zolpidem (AMBIEN) 10 MG tablet Take 1 tablet (10 mg total) by mouth at bedtime. 07/07/22   [name], NP      Allergies    Nsaids    Review of Systems   Review of Systems  Physical Exam Updated Vital Signs BP 110/74 Comment: Manual  Pulse 62   Temp (!) 97.2 F (36.2 C) (Axillary)   Resp 17   Ht 1.676 m ('5\' 6"'$ )   Wt 61.2 kg   SpO2 99%  BMI 21.79 kg/m  Physical Exam CONSTITUTIONAL: Elderly, no acute distress HEAD: Abrasion to forehead, no other signs of trauma EYES: EOMI/PERRL ENMT: Mucous membranes moist, abrasion to nose, no septal hematoma, no facial deformity or tenderness. Poor dentition at baseline but no evidence of any acute dental injury NECK: supple no meningeal signs SPINE/BACK:entire spine nontender No bruising/crepitance/stepoffs noted to spine CV: No loud murmurs LUNGS: Lungs are clear to auscultation bilaterally, no apparent distress Chest-no evidence of any chest trauma ABDOMEN: soft, nontender NEURO: Pt is awake/alert/appropriate, moves all extremitiesx4.  No facial droop.   EXTREMITIES: pulses normal/equal, full ROM, no deformities SKIN: warm, color normal, heart monitor on  chest PSYCH: no abnormalities of mood noted, alert and oriented to situation  ED Results / Procedures / Treatments   Labs (all labs ordered are listed, but only abnormal results are displayed) Labs Reviewed  CBC  BASIC METABOLIC PANEL    EKG None  Radiology CT Head Wo Contrast  Result Date: 07/10/2022 CLINICAL DATA:  Head trauma, minor (Age >= 65y). Chronic anticoagulation. EXAM: CT HEAD WITHOUT CONTRAST TECHNIQUE: Contiguous axial images were obtained from the base of the skull through the vertex without intravenous contrast. RADIATION DOSE REDUCTION: This exam was performed according to the departmental dose-optimization program which includes automated exposure control, adjustment of the mA and/or kV according to patient size and/or use of iterative reconstruction technique. COMPARISON:  08/13/2013 FINDINGS: Brain: Normal anatomic configuration. Parenchymal volume loss is commensurate with the patient's age. Mild periventricular white matter changes are present likely reflecting the sequela of small vessel ischemia. No abnormal intra or extra-axial mass lesion or fluid collection. No abnormal mass effect or midline shift. No evidence of acute intracranial hemorrhage or infarct. Ventricular size is normal. Cerebellum unremarkable. Vascular: No asymmetric hyperdense vasculature at the skull base. Skull: Intact Sinuses/Orbits: Paranasal sinuses are clear. Orbits are unremarkable. Other: Mastoid air cells and middle ear cavities are clear. IMPRESSION: 1. No acute intracranial abnormality. No calvarial fracture. 2. Mild senescent change. Electronically Signed   By: Fidela Salisbury M.D.   On: 07/10/2022 23:48    Procedures Procedures  {Document cardiac monitor, telemetry assessment procedure when appropriate:1}  Medications Ordered in ED Medications  Tdap (BOOSTRIX) injection 0.5 mL (has no administration in time range)    ED Course/ Medical Decision Making/ A&P         Glasgow Coma Scale  Score: 15      NEXUS Criteria Score: 0            Medical Decision Making Amount and/or Complexity of Data Reviewed Labs: ordered. Radiology: ordered. ECG/medicine tests: ordered.  Risk Prescription drug management.   This patient presents to the ED for concern of head injury, this involves an extensive number of treatment options, and is a complaint that carries with it a high risk of complications and morbidity.  The differential diagnosis includes but is not limited to subdural hematoma, subarachnoid hemorrhage, skull fracture, concussion  Comorbidities that complicate the patient evaluation: Patient's presentation is complicated by their history of atrial fibrillation on Eliquis, CAD  Social Determinants of Health: Patient's  poor mobility and hard of hearing   increases the complexity of managing their presentation  Additional history obtained: Records reviewed  cardiology notes reviewed  Lab Tests: I Ordered, and personally interpreted labs.  The pertinent results include:  ***  Imaging Studies ordered: I ordered imaging studies including CT scan head   I independently visualized and interpreted imaging which showed no acute findings I agree  with the radiologist interpretation  Cardiac Monitoring: The patient was maintained on a cardiac monitor.  I personally viewed and interpreted the cardiac monitor which showed an underlying rhythm of:  {cardiac monitor:26849}  Medicines ordered and prescription drug management: I ordered medication including ***  for ***  Reevaluation of the patient after these medicines showed that the patient    {resolved/improved/worsened:23923::"improved"}  Test Considered: Patient is low risk / negative by ***, therefore do not feel that *** is indicated.  Critical Interventions:  ***  Consultations Obtained: I requested consultation with the {consultation:26851}, and discussed  findings as well as pertinent plan - they recommend:  ***  Reevaluation: After the interventions noted above, I reevaluated the patient and found that they have :{resolved/improved/worsened:23923::"improved"}  Complexity of problems addressed: Patient's presentation is most consistent with  {UXNA:35573}  Disposition: After consideration of the diagnostic results and the patient's response to treatment,  I feel that the patent would benefit from {disposition:26850}.     {Document critical care time when appropriate:1} {Document review of labs and clinical decision tools ie heart score, Chads2Vasc2 etc:1}  {Document your independent review of radiology images, and any outside records:1} {Document your discussion with family members, caretakers, and with consultants:1} {Document social determinants of health affecting pt's care:1} {Document your decision making why or why not admission, treatments were needed:1} Final Clinical Impression(s) / ED Diagnoses Final diagnoses:  None    Rx / DC Orders ED Discharge Orders     None

## 2022-07-10 NOTE — ED Triage Notes (Addendum)
Pt arrived via [facility] for level 2 fall on thinners. Pt reports mechanical fall forward when standing from recliner. Started blood thinners this week. Abrasion to medial forehead, nose, medial upper lip. Pt reports moderate amount of blood loss from nose, stopped on its own prior to EMS arrival. Pt was sitting in chair when EMS arrived, denied then and continues to deny pain, dizziness, shortness of breath, nausea, vomiting, or chest pain before or after fall. Denies LOC. No ccollar in place at time of arrival, pt ambulated from ambulance to room. Pt is alert and oriented, GCS 15, on RA.  PTA EMS Vitals BP 138/70 HR 89 NS SPO2 98% RA RR 16 CBG 102

## 2022-07-10 NOTE — ED Notes (Signed)
Patient transported to CT with [facility]N

## 2022-07-11 ENCOUNTER — Other Ambulatory Visit: Payer: Self-pay

## 2022-07-11 LAB — BASIC METABOLIC PANEL
Anion gap: 12 (ref 5–15)
BUN: 19 mg/dL (ref 8–23)
CO2: 22 mmol/L (ref 22–32)
Calcium: 9.6 mg/dL (ref 8.9–10.3)
Chloride: 105 mmol/L (ref 98–111)
Creatinine, Ser: 1.18 mg/dL (ref 0.61–1.24)
GFR, Estimated: 59 mL/min — ABNORMAL LOW (ref >60)
Glucose, Bld: 109 mg/dL — ABNORMAL HIGH (ref 70–99)
Potassium: 3.6 mmol/L (ref 3.5–5.1)
Sodium: 139 mmol/L (ref 135–145)

## 2022-07-11 LAB — CBC
HCT: 38.5 % — ABNORMAL LOW (ref 39.0–52.0)
Hemoglobin: 13.3 g/dL (ref 13.0–17.0)
MCH: 31.9 pg (ref 26.0–34.0)
MCHC: 34.5 g/dL (ref 30.0–36.0)
MCV: 92.3 fL (ref 80.0–100.0)
Platelets: 192 10*3/uL (ref 150–400)
RBC: 4.17 MIL/uL — ABNORMAL LOW (ref 4.22–5.81)
RDW: 13 % (ref 11.5–15.5)
WBC: 5.9 10*3/uL (ref 4.0–10.5)
nRBC: 0 % (ref 0.0–0.2)

## 2022-07-11 NOTE — ED Notes (Signed)
..  Trauma Response Nurse Documentation   BUZZ AXEL Sr. is a 88 y.o. male arriving to [facility] ED via [facility]  On clopidogrel 75 mg daily and No antithrombotic. Trauma was activated as a Level 2 by charge nurse based on the following trauma criteria Elderly patients > 65 with head trauma on anti-coagulation (excluding ASA). Trauma team at the bedside on patient arrival.   Patient cleared for CT by Dr. Sharlett Iles. Pt transported to CT with trauma response nurse present to monitor. RN remained with the patient throughout their absence from the department for clinical observation. Pt monitored through out CT with direct visualization of CCM due to facial trauma on Plavix and hx of afib.   GCS 15.  History   Past Medical History:  Diagnosis Date   Acute MI ([place])    Arthritis    CAD (coronary artery disease)    Coronary artery disease    GERD (gastroesophageal reflux disease)    HLD (hyperlipidemia)    HTN (hypertension)    Hyperlipidemia    Hypertension    Prostate cancer ([place])    Wears glasses      Past Surgical History:  Procedure Laterality Date   CORONARY ANGIOPLASTY     11/30/94 (Dr. Daneen Schick): Mild ant/anteroapical hypokinesis (known ant MI '94), EF 60%. 50-60%pLAD, 60% RI, 50% oRCA, 90% OM1 (s/p PTCA '96)   KNEE ARTHROSCOPY Right 03/01/2018   Procedure: ARTHROSCOPY KNEE;  Surgeon: Frederik Pear, MD;  Location: [facility];  Service: Orthopedics;  Laterality: Right;  debridement of medial and lateral meniscal tears   LEFT HEART CATH AND CORONARY ANGIOGRAPHY N/A 06/22/2022   Procedure: LEFT HEART CATH AND CORONARY ANGIOGRAPHY;  Surgeon: Troy Sine, MD;  Location: [facility];  Service: Cardiovascular;  Laterality: N/A;   Lumbar back surgery         Initial Focused Assessment (If applicable, or please see trauma documentation):  Abrasions noted on forehead between eyes, bridge of nose, tip of nose, upper lip, GCS 15 CT's Completed:   CT Head   Interventions:   -Initial trauma care/assessment -IV start -lab draw -pt transported to/from ct on CCM -family communication  Plan for disposition:  Discharge home   Consults completed:  none   Event Summary: Pt arrived via [facility], pt insisted on walking to ED room, steady gait.  Pt states when he attempted to stand up from recliner at home feet became tangled in chair causing him to fall face first onto carpet. Pt called family members then called EMS. Pt denies LOC, pt does take Plavix. IV est, labs drawn, pt denies cspine/back pain. Pt transported to/from CT, family arrived at bedside.  Tdap given by primary RN Pt able to be d/c home with family after neg CT's.   Bedside handoff with ED RN [place].    Nashay Brickley, [place]  Trauma Response RN  Please call TRN at [phone] for further assistance.

## 2022-07-14 NOTE — Progress Notes (Signed)
Please let the patient know that his labs drawn during his visit looked great. Thanks  -HB

## 2022-07-15 ENCOUNTER — Other Ambulatory Visit: Payer: Self-pay | Admitting: Physician Assistant

## 2022-07-23 ENCOUNTER — Other Ambulatory Visit (HOSPITAL_COMMUNITY): Payer: Self-pay

## 2022-07-23 ENCOUNTER — Other Ambulatory Visit: Payer: Self-pay | Admitting: Nurse Practitioner

## 2022-07-25 DIAGNOSIS — D509 Iron deficiency anemia, unspecified: Secondary | ICD-10-CM | POA: Insufficient documentation

## 2022-07-27 ENCOUNTER — Other Ambulatory Visit: Payer: Self-pay

## 2022-07-27 DIAGNOSIS — I4891 Unspecified atrial fibrillation: Secondary | ICD-10-CM

## 2022-07-27 MED ORDER — LOSARTAN POTASSIUM 25 MG PO TABS: 25.0000 mg | ORAL_TABLET | Freq: Every day | ORAL | 3 refills | Status: DC

## 2022-07-27 MED ORDER — APIXABAN 2.5 MG PO TABS: 2.5000 mg | ORAL_TABLET | Freq: Two times a day (BID) | ORAL | 1 refills | Status: DC

## 2022-07-27 MED ORDER — METOPROLOL TARTRATE 25 MG PO TABS: 12.5000 mg | ORAL_TABLET | Freq: Two times a day (BID) | ORAL | 3 refills | Status: DC

## 2022-07-27 NOTE — Telephone Encounter (Signed)
Eliquis 2.'5mg'$  refill request received. Patient is 88 years old, weight-61.2kg, Crea-1.18 on 07/10/2022, Diagnosis-Afib, and last seen by Ambrose Pancoast, NP on 07/08/2022. Per last OV note it states:  AF with RVR: -New onset currently rate controlled with rate of 80 -Patient denies blood in his urine or stool -Continue Eliquis 2.5 mg twice daily  Will send in refill to requested pharmacy.

## 2022-07-27 NOTE — Telephone Encounter (Signed)
Pt's medication was sent to pt's pharmacy as requested. Confirmation received.  °

## 2022-07-29 ENCOUNTER — Telehealth: Payer: Self-pay | Admitting: Nurse Practitioner

## 2022-07-29 NOTE — Telephone Encounter (Signed)
Talked to the patient about his medications, advised that his Eliquis, Losartan, and Metoprolol tartrate were all just sent to the pharmacy on 07/27/22. The patient got his medication bottles and confirmed he picked those up. Said he called because when he went to the pharmacy they told him that they were still waiting on the doctor to approve one. Advise it wasn't one from [place]. Verbalized understanding.

## 2022-07-29 NOTE — Telephone Encounter (Signed)
Patient called stating Randy [name] was suppose to call in some medications for him, he states the pharmacy hasn't received it yet.  He doesn't know the name of the medications. He stated he has been without his medication for the past three days.

## 2022-07-30 ENCOUNTER — Other Ambulatory Visit: Payer: Self-pay | Admitting: Nurse Practitioner

## 2022-07-30 ENCOUNTER — Telehealth: Payer: Self-pay | Admitting: Cardiology

## 2022-07-30 MED ORDER — LOSARTAN POTASSIUM 25 MG PO TABS: 25.0000 mg | ORAL_TABLET | Freq: Every day | ORAL | 3 refills | Status: DC

## 2022-07-30 NOTE — Telephone Encounter (Signed)
*  STAT* If patient is at the pharmacy, call can be transferred to refill team.   1. Which medications need to be refilled? (please list name of each medication and dose if known) losartan (COZAAR) 25 MG tablet  2. Which pharmacy/location (including street and city if local pharmacy) is medication to be sent to? [place], [address]  3. Do they need a 30 day or 90 day supply?  90 day supply    Pharmacy advised pt's wife they do not have prescription.

## 2022-08-23 ENCOUNTER — Telehealth: Payer: Self-pay

## 2022-08-23 NOTE — Telephone Encounter (Signed)
Patients spouse called office for refill on patients metoprolol, please advise, thanks!

## 2022-08-23 NOTE — Telephone Encounter (Signed)
Please let the patient know that refills for his metoprolol were sent to his pharmacy by his heart  doctor. It was a 90 day prescription with additional refills.  Thanks  -HB

## 2022-08-28 ENCOUNTER — Other Ambulatory Visit: Payer: Self-pay | Admitting: Nurse Practitioner

## 2022-08-28 DIAGNOSIS — I10 Essential (primary) hypertension: Secondary | ICD-10-CM

## 2022-08-31 ENCOUNTER — Other Ambulatory Visit: Payer: Self-pay | Admitting: Nurse Practitioner

## 2022-08-31 DIAGNOSIS — I10 Essential (primary) hypertension: Secondary | ICD-10-CM

## 2022-09-06 ENCOUNTER — Ambulatory Visit: Payer: Medicare Other | Attending: Nurse Practitioner | Admitting: Nurse Practitioner

## 2022-10-05 ENCOUNTER — Other Ambulatory Visit: Payer: Self-pay | Admitting: Nurse Practitioner

## 2022-10-05 DIAGNOSIS — F5101 Primary insomnia: Secondary | ICD-10-CM

## 2022-10-29 ENCOUNTER — Other Ambulatory Visit: Payer: Self-pay

## 2022-10-29 ENCOUNTER — Inpatient Hospital Stay: Payer: Medicare Other

## 2022-10-29 ENCOUNTER — Inpatient Hospital Stay: Payer: Medicare Other | Attending: Oncology

## 2022-10-29 ENCOUNTER — Inpatient Hospital Stay: Payer: Medicare Other | Admitting: Oncology

## 2022-10-29 VITALS — BP 103/60 | HR 75 | Temp 97.7°F | Resp 18 | Ht 66.0 in | Wt 135.0 lb

## 2022-10-29 DIAGNOSIS — Z8546 Personal history of malignant neoplasm of prostate: Secondary | ICD-10-CM

## 2022-10-29 DIAGNOSIS — C7951 Secondary malignant neoplasm of bone: Secondary | ICD-10-CM | POA: Insufficient documentation

## 2022-10-29 DIAGNOSIS — C779 Secondary and unspecified malignant neoplasm of lymph node, unspecified: Secondary | ICD-10-CM | POA: Insufficient documentation

## 2022-10-29 DIAGNOSIS — C61 Malignant neoplasm of prostate: Secondary | ICD-10-CM | POA: Diagnosis not present

## 2022-10-29 DIAGNOSIS — Z5111 Encounter for antineoplastic chemotherapy: Secondary | ICD-10-CM | POA: Diagnosis not present

## 2022-10-29 LAB — CMP (CANCER CENTER ONLY)
ALT: 7 U/L (ref 0–44)
AST: 14 U/L — ABNORMAL LOW (ref 15–41)
Albumin: 3.9 g/dL (ref 3.5–5.0)
Alkaline Phosphatase: 67 U/L (ref 38–126)
Anion gap: 5 (ref 5–15)
BUN: 18 mg/dL (ref 8–23)
CO2: 28 mmol/L (ref 22–32)
Calcium: 9.1 mg/dL (ref 8.9–10.3)
Chloride: 106 mmol/L (ref 98–111)
Creatinine: 1.04 mg/dL (ref 0.61–1.24)
GFR, Estimated: >60 mL/min (ref >60)
Glucose, Bld: 91 mg/dL (ref 70–99)
Potassium: 4.5 mmol/L (ref 3.5–5.1)
Sodium: 139 mmol/L (ref 135–145)
Total Bilirubin: 0.4 mg/dL (ref 0.3–1.2)
Total Protein: 6.4 g/dL — ABNORMAL LOW (ref 6.5–8.1)

## 2022-10-29 LAB — CBC WITH DIFFERENTIAL (CANCER CENTER ONLY)
Abs Immature Granulocytes: 0.02 10*3/uL (ref 0.00–0.07)
Basophils Absolute: 0 10*3/uL (ref 0.0–0.1)
Basophils Relative: 0 %
Eosinophils Absolute: 0.2 10*3/uL (ref 0.0–0.5)
Eosinophils Relative: 4 %
HCT: 36.4 % — ABNORMAL LOW (ref 39.0–52.0)
Hemoglobin: 12.3 g/dL — ABNORMAL LOW (ref 13.0–17.0)
Immature Granulocytes: 0 %
Lymphocytes Relative: 16 %
Lymphs Abs: 0.9 10*3/uL (ref 0.7–4.0)
MCH: 31.7 pg (ref 26.0–34.0)
MCHC: 33.8 g/dL (ref 30.0–36.0)
MCV: 93.8 fL (ref 80.0–100.0)
Monocytes Absolute: 0.5 10*3/uL (ref 0.1–1.0)
Monocytes Relative: 10 %
Neutro Abs: 3.9 10*3/uL (ref 1.7–7.7)
Neutrophils Relative %: 70 %
Platelet Count: 160 10*3/uL (ref 150–400)
RBC: 3.88 MIL/uL — ABNORMAL LOW (ref 4.22–5.81)
RDW: 12.9 % (ref 11.5–15.5)
WBC Count: 5.5 10*3/uL (ref 4.0–10.5)
nRBC: 0 % (ref 0.0–0.2)

## 2022-10-29 MED ORDER — LEUPROLIDE ACETATE (4 MONTH) 30 MG ~~LOC~~ KIT
30.0000 mg | PACK | Freq: Once | SUBCUTANEOUS | Status: AC
  Administered 2022-10-29: 30 mg via SUBCUTANEOUS
  Filled 2022-10-29: qty 30

## 2022-10-29 NOTE — Progress Notes (Signed)
Hematology and Oncology Follow Up   Randy MCMICHEN Sr. [id number] 1930-01-01 88 y.o. 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     Prior Therapy:   At that time he underwent a radical prostatectomy for a Gleason score 4+3 = 7 and a PSA of 6.8.  He subsequently developed a biochemical relapse and required androgen deprivation therapy prior 2016.  He subsequently developed castration resistant disease with a rise in his PSA up to 6.63 in July 2018.  Apalutamide was started and he has been on it since that time.  His nadir of PSA was 3.06 in February 2019.  His PSA was up to 6.29 in August 2019 and in December 2019 was up to 7.87  He is status post radiation therapy to the pelvic lymph nodes for a total of 68.3 Gray in 38 fractions as well as 50 Gray in 5 fractions to the rib.  Therapy concluded in June 2022.    Current therapy:   Apalutamide 240 mg daily started July 2018.  Eligard 30 mg every 4 months.  This will be reviewed today and repeated in April 2024.    Interim History: Randy [name] presents today for a follow-up visit.  Since last visit, he reports feeling well without any major complaints.  He denies any nausea vomiting or abdominal pain.  He denies any hospitalizations or illnesses.  He did sustain a fall and has occasional shoulder and rib cage pain.  He denies any complications related to apalutamide.  He denies excessive fatigue, tiredness or weakness.   Medications: Reviewed without changes. Current Outpatient Medications  Medication Sig Dispense Refill   acetaminophen (TYLENOL) 500 MG tablet Take 500 mg by mouth every 6 (six) hours as needed for mild pain or moderate pain.     apixaban (ELIQUIS)  2.5 MG TABS tablet Take 1 tablet (2.5 mg total) by mouth 2 (two) times daily. 180 tablet 1   ASPERCREME LIDOCAINE EX Apply 1 application topically daily as needed (pain).     Calcium Carb-Cholecalciferol (CALCIUM 1000 + D PO) Take 1 tablet by mouth daily.     ERLEADA 60 MG tablet Take 240 mg by mouth daily.     fish oil-omega-3 fatty acids 1000 MG capsule Take 1 g by mouth 2 (two) times daily.      fluticasone (FLONASE) 50 MCG/ACT nasal spray SPRAY 2 SPRAYS INTO EACH NOSTRIL EVERY DAY 48 mL 1   furosemide (LASIX) 20 MG tablet Take 1 tablet (20 mg total) by mouth daily. 5 tablet 0   LINZESS 145 MCG CAPS capsule Take 145 mcg by mouth daily as needed (constipation).     losartan (COZAAR) 25 MG tablet Take 1 tablet (25 mg total) by mouth daily. 90 tablet 3   metoprolol tartrate (LOPRESSOR) 25 MG tablet Take 0.5 tablets (12.5 mg total) by mouth 2 (two) times daily. 90 tablet 3   niacin 500 MG tablet Take 500 mg by mouth at bedtime.     nitroGLYCERIN (NITROSTAT) 0.4 MG SL tablet Place 1 tablet (0.4 mg total) under the tongue every 5 (five) minutes as needed for chest pain. 25 tablet 1   promethazine (PHENERGAN) 25 MG tablet TAKE 1 TABLET BY MOUTH EVERY 8 HOURS AS NEEDED  FOR NAUSEA AND VOMITING 30 tablet 1   sertraline (ZOLOFT) 100 MG tablet Take 100 mg by mouth daily.     simvastatin (ZOCOR) 20 MG tablet Take 1 tablet (20 mg total) by mouth daily at 6 PM. 90 tablet 3   zolpidem (AMBIEN) 10 MG tablet TAKE 1 TABLET BY MOUTH AT BEDTIME 90 tablet 0   No current facility-administered medications for this visit.     Allergies:  Allergies  Allergen Reactions   Nsaids     Had a heart attack, so can not take NSAIDS   Physical exam Blood pressure 103/60, pulse 75, temperature 97.7 F (36.5 C), temperature source Temporal, resp. rate 18, height _0  (1.676 m), weight 135 lb (61.2 kg), SpO2 99 %.   ECOG 1     General appearance: Alert, awake without any distress. Head: Atraumatic without  abnormalities Oropharynx: Without any thrush or ulcers. Eyes: No scleral icterus. Lymph nodes: No lymphadenopathy noted in the cervical, supraclavicular, or axillary nodes Heart:regular rate and rhythm, without any murmurs or gallops.   Lung: Clear to auscultation without any rhonchi, wheezes or dullness to percussion. Abdomin: Soft, nontender without any shifting dullness or ascites. Musculoskeletal: No clubbing or cyanosis. Neurological: No motor or sensory deficits. Skin: No rashes or lesions.     Lab Results: Lab Results  Component Value Date   WBC 5.9 07/10/2022   HGB 13.3 07/10/2022   HCT 38.5 (L) 07/10/2022   MCV 92.3 07/10/2022   PLT 192 07/10/2022   PSA 6.29 06/20/2018     Chemistry      Component Value Date/Time   NA 139 07/10/2022 2326   NA 143 07/08/2022 1036   K 3.6 07/10/2022 2326   CL 105 07/10/2022 2326   CO2 22 07/10/2022 2326   BUN 19 07/10/2022 2326   BUN 14 07/08/2022 1036   CREATININE 1.18 07/10/2022 2326   CREATININE 0.99 07/02/2022 1459      Component Value Date/Time   CALCIUM 9.6 07/10/2022 2326   ALKPHOS 60 07/02/2022 1459   AST 13 (L) 07/02/2022 1459   ALT 8 07/02/2022 1459   BILITOT 0.5 07/02/2022 1459      Latest Reference Range & Units 07/09/21 09:43 09/18/21 13:56 12/11/21 14:27 02/22/22 14:52 07/02/22 14:59  Prostate Specific Ag, Serum 0.0 - 4.0 ng/mL 17.8 (H) 12.0 (H) 8.4 (H) 6.7 (H) 7.0 (H)  (H): Data is abnormally high   Impression and Plan:  88 year old man with:   1.   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   The natural course of this disease was reviewed at this time and treatment options were discussed.  He continues to tolerate apalutamide without any major complications.  Risks and benefits of continuing this treatment were reviewed.  Complications that include fatigue, hot flashes among others were reiterated.  At this time, I recommended continuing the  same dose and schedule given his reasonable tolerance and overall stable PSA.  Different salvage therapy option including a PARP inhibitor given his ATM mutation versus Taxotere chemotherapy.  These will be considered if he has symptomatic progression of his disease.   2.  Androgen deprivation therapy: He will receive Eligard today and repeated in 4 months.  Complications: Hot flashes and weight gain were reiterated.       3.  Follow-up: He will return in 4 months for a follow-up.   30  minutes were spent on this encounter.  The time was dedicated to  updating disease status, treatment choices and outlining future plan of care reviewed.    Zola Button, MD 10/29/2022 3:01 PM

## 2022-10-30 LAB — PROSTATE-SPECIFIC AG, SERUM (LABCORP): Prostate Specific Ag, Serum: 5.3 ng/mL — ABNORMAL HIGH (ref 0.0–4.0)

## 2022-11-02 ENCOUNTER — Telehealth: Payer: Self-pay | Admitting: *Deleted

## 2022-11-02 NOTE — Telephone Encounter (Signed)
-----   Message from Wyatt Portela, MD sent at 11/02/2022  9:08 AM EST ----- Please let him know his PSA is down

## 2022-11-02 NOTE — Telephone Encounter (Signed)
PC to patient, spoke with his wife Jeanett Schlein, informed her of patient's PSA results, she verbalizes understanding.

## 2022-11-12 ENCOUNTER — Telehealth: Payer: Self-pay | Admitting: Pharmacy Technician

## 2022-11-12 NOTE — Telephone Encounter (Signed)
Oral Oncology Patient Advocate Encounter   Received notification re-enrollment for assistance for Erleada through [employer] has been approved. Patient may continue to receive their medication at $0 from this program.    Randy [name] phone number [phone].   Effective dates: 11/08/22 through 11/08/23  Randy [name], [name] [name] Direct Number: [phone]  Fax: [phone]

## 2023-01-06 ENCOUNTER — Other Ambulatory Visit: Payer: Self-pay | Admitting: Nurse Practitioner

## 2023-01-06 DIAGNOSIS — F5101 Primary insomnia: Secondary | ICD-10-CM

## 2023-01-10 ENCOUNTER — Telehealth: Payer: Self-pay | Admitting: *Deleted

## 2023-01-10 NOTE — Telephone Encounter (Signed)
Called to inform pt that pharmacy has medication ready. Jozsef Wescoat Zimmerman Rumple, CMA

## 2023-01-22 ENCOUNTER — Other Ambulatory Visit: Payer: Self-pay | Admitting: Nurse Practitioner

## 2023-01-22 DIAGNOSIS — I4891 Unspecified atrial fibrillation: Secondary | ICD-10-CM

## 2023-01-24 NOTE — Telephone Encounter (Signed)
Pt last saw Ambrose Pancoast, NP on 07/08/22, last labs 10/29/22 Creat 1.04, age 89, weight 61.2kg.  Per last OV note it states: AF with RVR: -New onset currently rate controlled with rate of 80 -Patient denies blood in his urine or stool -Continue Eliquis 2.5mg  twice daily Will refill current Eliquis rx and continue to monitor weight, if continues to increase may need to adjust Eliquis dosage.

## 2023-02-17 ENCOUNTER — Other Ambulatory Visit: Payer: Self-pay | Admitting: Nurse Practitioner

## 2023-02-17 DIAGNOSIS — E782 Mixed hyperlipidemia: Secondary | ICD-10-CM

## 2023-02-26 ENCOUNTER — Other Ambulatory Visit: Payer: Self-pay | Admitting: Nurse Practitioner

## 2023-02-26 DIAGNOSIS — E782 Mixed hyperlipidemia: Secondary | ICD-10-CM

## 2023-02-28 ENCOUNTER — Telehealth: Payer: Self-pay

## 2023-02-28 NOTE — Telephone Encounter (Signed)
Contacted Randy Picket Seabrook Sr. to schedule their annual wellness visit. Appointment made for 03/01/23.  Randy [name], CMA (AAMA)  [facility]- AWV Program [phone]

## 2023-03-01 ENCOUNTER — Telehealth: Payer: Self-pay

## 2023-03-01 NOTE — Telephone Encounter (Signed)
Unsuccessful attempt to reach patient on preferred number listed in notes for scheduled AWV. Continued busy line. Unable to leave message

## 2023-03-02 ENCOUNTER — Inpatient Hospital Stay: Payer: Medicare Other

## 2023-03-02 ENCOUNTER — Telehealth: Payer: Self-pay | Admitting: Hematology and Oncology

## 2023-03-02 ENCOUNTER — Inpatient Hospital Stay: Payer: Medicare Other | Admitting: Hematology and Oncology

## 2023-03-02 NOTE — Telephone Encounter (Signed)
Patient is sick. Patient's spouse called to reschedule 4/24 appointments. Patient rescheduled and will be notified.

## 2023-03-02 NOTE — Progress Notes (Unsigned)
[facility] [phone]   [phone]  PROGRESS NOTE  Patient Care Team: Carlean Jews, NP as PCP - General (Family Medicine) Rollene Rotunda, MD as PCP - Cardiology (Cardiology) Felicita Gage, RN Nurse Navigator as Registered Nurse (Medical Oncology) Carlean Jews, NP (Family Medicine)  Hematological/Oncological History # ***  Interval History:  Randy Skill Sr. 89 y.o. male with medical history significant for *** presents for a follow up visit. The patient's last visit was on ***. In the interim since the last visit ***  MEDICAL HISTORY:  Past Medical History:  Diagnosis Date   Acute MI (HCC)    Arthritis    CAD (coronary artery disease)    Coronary artery disease    GERD (gastroesophageal reflux disease)    HLD (hyperlipidemia)    HTN (hypertension)    Hyperlipidemia    Hypertension    Prostate cancer (HCC)    Wears glasses     SURGICAL HISTORY: Past Surgical History:  Procedure Laterality Date   CORONARY ANGIOPLASTY     11/30/94 (Dr. Verdis Prime): Mild ant/anteroapical hypokinesis (known ant MI '94), EF 60%. 50-60%pLAD, 60% RI, 50% oRCA, 90% OM1 (s/p PTCA '96)   KNEE ARTHROSCOPY Right 03/01/2018   Procedure: ARTHROSCOPY KNEE;  Surgeon: Gean Birchwood, MD;  Location: [facility] OR;  Service: Orthopedics;  Laterality: Right;  debridement of medial and lateral meniscal tears   LEFT HEART CATH AND CORONARY ANGIOGRAPHY N/A 06/22/2022   Procedure: LEFT HEART CATH AND CORONARY ANGIOGRAPHY;  Surgeon: Lennette Bihari, MD;  Location: [facility] INVASIVE CV LAB;  Service: Cardiovascular;  Laterality: N/A;   Lumbar back surgery      SOCIAL HISTORY: Social History   Socioeconomic History   Marital status: Married    Spouse name: Para March   Number of children: 2   Years of education: Not on file   Highest education level: Not on file  Occupational History    Comment: retired  Tobacco Use   Smoking status: Never   Smokeless tobacco: Never   Vaping Use   Vaping Use: Never used  Substance and Sexual Activity   Alcohol use: Not Currently   Drug use: Never   Sexual activity: Not Currently  Other Topics Concern   Not on file  Social History Narrative   ** Merged History Encounter **       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     Social Determinants of Health   Financial Resource Strain: Not on file  Food Insecurity: Not on file  Transportation Needs: No Transportation Needs (06/28/2022)   PRAPARE - Administrator, Civil Service (Medical): No    Lack of Transportation (Non-Medical): No  Physical Activity: Not on file  Stress: Not on file  Social Connections: Not on file  Intimate Partner Violence: Not on file    FAMILY HISTORY: Family History  Problem Relation Age of Onset   Alzheimer's disease Mother    Bone cancer Father    Breast cancer Neg Hx    Colon cancer Neg Hx    Prostate cancer Neg Hx    Pancreatic cancer Neg Hx     ALLERGIES:  is allergic to nsaids.  MEDICATIONS:  Current Outpatient Medications  Medication Sig Dispense Refill   acetaminophen (TYLENOL) 500 MG tablet Take 500 mg by mouth every 6 (six) hours as needed for mild pain or moderate pain.     apixaban (ELIQUIS) 2.5 MG TABS tablet Take 1 tablet by mouth twice  daily 180 tablet 1   ASPERCREME LIDOCAINE EX Apply 1 application topically daily as needed (pain).     Calcium Carb-Cholecalciferol (CALCIUM 1000 + D PO) Take 1 tablet by mouth daily.     ERLEADA 60 MG tablet Take 240 mg by mouth daily.     fish oil-omega-3 fatty acids 1000 MG capsule Take 1 g by mouth 2 (two) times daily.      fluticasone (FLONASE) 50 MCG/ACT nasal spray SPRAY 2 SPRAYS INTO EACH NOSTRIL EVERY DAY 48 mL 1   furosemide (LASIX) 20 MG tablet Take 1 tablet (20 mg total) by mouth daily. 5 tablet 0   LINZESS 145 MCG CAPS capsule Take 145 mcg by mouth daily as needed (constipation).     losartan (COZAAR) 25 MG tablet Take 1 tablet (25 mg total) by mouth daily. 90 tablet  3   metoprolol tartrate (LOPRESSOR) 25 MG tablet Take 0.5 tablets (12.5 mg total) by mouth 2 (two) times daily. 90 tablet 3   niacin 500 MG tablet Take 500 mg by mouth at bedtime.     nitroGLYCERIN (NITROSTAT) 0.4 MG SL tablet Place 1 tablet (0.4 mg total) under the tongue every 5 (five) minutes as needed for chest pain. 25 tablet 1   promethazine (PHENERGAN) 25 MG tablet TAKE 1 TABLET BY MOUTH EVERY 8 HOURS AS NEEDED FOR NAUSEA AND VOMITING 30 tablet 1   sertraline (ZOLOFT) 100 MG tablet Take 100 mg by mouth daily.     simvastatin (ZOCOR) 20 MG tablet Take 1 tablet (20 mg total) by mouth daily at 6 PM. 90 tablet 3   zolpidem (AMBIEN) 10 MG tablet TAKE 1 TABLET BY MOUTH AT BEDTIME 90 tablet 0   No current facility-administered medications for this visit.    REVIEW OF SYSTEMS:   Constitutional: ( - ) fevers, ( - )  chills , ( - ) night sweats Eyes: ( - ) blurriness of vision, ( - ) double vision, ( - ) watery eyes Ears, nose, mouth, throat, and face: ( - ) mucositis, ( - ) sore throat Respiratory: ( - ) cough, ( - ) dyspnea, ( - ) wheezes Cardiovascular: ( - ) palpitation, ( - ) chest discomfort, ( - ) lower extremity swelling Gastrointestinal:  ( - ) nausea, ( - ) heartburn, ( - ) change in bowel habits Skin: ( - ) abnormal skin rashes Lymphatics: ( - ) new lymphadenopathy, ( - ) easy bruising Neurological: ( - ) numbness, ( - ) tingling, ( - ) new weaknesses Behavioral/Psych: ( - ) mood change, ( - ) new changes  All other systems were reviewed with the patient and are negative.  PHYSICAL EXAMINATION: ECOG PERFORMANCE STATUS: {CHL ONC ECOG PS:[phone]}  There were no vitals filed for this visit. There were no vitals filed for this visit.  GENERAL: alert, no distress and comfortable SKIN: skin color, texture, turgor are normal, no rashes or significant lesions EYES: conjunctiva are pink and non-injected, sclera clear OROPHARYNX: no exudate, no erythema; lips, buccal mucosa, and  tongue normal  NECK: supple, non-tender LYMPH:  no palpable lymphadenopathy in the cervical, axillary or inguinal LUNGS: clear to auscultation and percussion with normal breathing effort HEART: regular rate & rhythm and no murmurs and no lower extremity edema ABDOMEN: soft, non-tender, non-distended, normal bowel sounds Musculoskeletal: no cyanosis of digits and no clubbing  PSYCH: alert & oriented x 3, fluent speech NEURO: no focal motor/sensory deficits  LABORATORY DATA:  I have reviewed the data  as listed    Latest Ref Rng & Units 10/29/2022    3:03 PM 07/10/2022   11:26 PM 07/08/2022   10:36 AM  CBC  WBC 4.0 - 10.5 K/uL 5.5  5.9  4.6   Hemoglobin 13.0 - 17.0 g/dL 96.2  95.2  84.1   Hematocrit 39.0 - 52.0 % 36.4  38.5  39.3   Platelets 150 - 400 K/uL 160  192  187        Latest Ref Rng & Units 10/29/2022    3:03 PM 07/10/2022   11:26 PM 07/08/2022   10:36 AM  CMP  Glucose 70 - 99 mg/dL 91  324  401   BUN 8 - 23 mg/dL Creatinine 0.61 - 1.24 mg/dL 0.27  2.53  6.64   Sodium 135 - 145 mmol/L 139  139  143   Potassium 3.5 - 5.1 mmol/L 4.5  3.6  4.3   Chloride 98 - 111 mmol/L 106  105  105   CO2 22 - 32 mmol/L Calcium 8.9 - 10.3 mg/dL 9.1  9.6  8.9   Total Protein 6.5 - 8.1 g/dL 6.4     Total Bilirubin 0.3 - 1.2 mg/dL 0.4     Alkaline Phos 38 - 126 U/L 67     AST 15 - 41 U/L 14     ALT 0 - 44 U/L 7       No results found for: "MPROTEIN" No results found for: "KPAFRELGTCHN", "LAMBDASER", "KAPLAMBRATIO"   BLOOD FILM: *** Review of the peripheral blood smear showed normal appearing white cells with neutrophils that were appropriately lobated and granulated. There was no predominance of bi-lobed or hyper-segmented neutrophils appreciated. No Dohle bodies were noted. There was no left shifting, immature forms or blasts noted. Lymphocytes remain normal in size without any predominance of large granular lymphocytes. Red cells show no anisopoikilocytosis,  macrocytes , microcytes or polychromasia. There were no schistocytes, target cells, echinocytes, acanthocytes, dacrocytes, or stomatocytes.There was no rouleaux formation, nucleated red cells, or intra-cellular inclusions noted. The platelets are normal in size, shape, and color without any clumping evident.  RADIOGRAPHIC STUDIES: I have personally reviewed the radiological images as listed and agreed with the findings in the report. No results found.  ASSESSMENT & PLAN ***  No orders of the defined types were placed in this encounter.   All questions were answered. The patient knows to call the clinic with any problems, questions or concerns.  A total of more than 40 minutes were spent on this encounter with face-to-face time and non-face-to-face time, including preparing to see the patient, ordering tests and/or medications, counseling the patient and coordination of care as outlined above.   Randy Barns, MD Department of Hematology/Oncology [facility] Phone: [phone] Pager: [phone] Email: [email or web address]  03/02/2023 7:48 AM

## 2023-03-03 ENCOUNTER — Encounter: Payer: Self-pay | Admitting: Hematology and Oncology

## 2023-03-07 ENCOUNTER — Telehealth: Payer: Self-pay | Admitting: Hematology and Oncology

## 2023-03-17 ENCOUNTER — Other Ambulatory Visit: Payer: Self-pay | Admitting: Nurse Practitioner

## 2023-03-17 ENCOUNTER — Other Ambulatory Visit: Payer: Self-pay | Admitting: Physician Assistant

## 2023-03-17 DIAGNOSIS — Z8546 Personal history of malignant neoplasm of prostate: Secondary | ICD-10-CM

## 2023-03-18 ENCOUNTER — Inpatient Hospital Stay: Payer: Medicare Other | Attending: Nurse Practitioner

## 2023-03-18 ENCOUNTER — Telehealth: Payer: Self-pay | Admitting: Physician Assistant

## 2023-03-18 ENCOUNTER — Inpatient Hospital Stay: Payer: Medicare Other | Admitting: Physician Assistant

## 2023-03-18 ENCOUNTER — Inpatient Hospital Stay: Payer: Medicare Other

## 2023-03-23 ENCOUNTER — Other Ambulatory Visit: Payer: Self-pay | Admitting: Nurse Practitioner

## 2023-03-23 DIAGNOSIS — E782 Mixed hyperlipidemia: Secondary | ICD-10-CM

## 2023-03-25 ENCOUNTER — Other Ambulatory Visit: Payer: Medicare Other

## 2023-03-25 ENCOUNTER — Ambulatory Visit: Payer: Medicare Other | Admitting: Hematology and Oncology

## 2023-03-25 ENCOUNTER — Ambulatory Visit: Payer: Medicare Other

## 2023-04-08 ENCOUNTER — Other Ambulatory Visit: Payer: Self-pay | Admitting: Nurse Practitioner

## 2023-04-08 DIAGNOSIS — F5101 Primary insomnia: Secondary | ICD-10-CM

## 2023-05-04 ENCOUNTER — Telehealth: Payer: Self-pay | Admitting: Hematology and Oncology

## 2023-05-05 ENCOUNTER — Telehealth: Payer: Self-pay | Admitting: *Deleted

## 2023-05-05 ENCOUNTER — Other Ambulatory Visit: Payer: Self-pay | Admitting: *Deleted

## 2023-05-05 ENCOUNTER — Telehealth: Payer: Self-pay

## 2023-05-05 DIAGNOSIS — I4891 Unspecified atrial fibrillation: Secondary | ICD-10-CM

## 2023-05-05 MED ORDER — APIXABAN 2.5 MG PO TABS: 2.5000 mg | ORAL_TABLET | Freq: Two times a day (BID) | ORAL | 1 refills | Status: DC

## 2023-05-05 MED ORDER — ERLEADA 60 MG PO TABS: 240.0000 mg | ORAL_TABLET | Freq: Every day | ORAL | 1 refills | Status: DC

## 2023-05-05 NOTE — Telephone Encounter (Signed)
Former [employer] pt-T/C from pt's wife requesting a refill for Erleada 60 mg.  Pt is completely out and took his last one two days ago.  His next appt is 05/13/23

## 2023-05-05 NOTE — Telephone Encounter (Signed)
TCT patient and his wife regarding refills on his medications of Erleada and Eliquis. No answer  to either phone. TCT pt's son, Arlys John and spoke with him. Advised that 2 refills have been sent in for his dad-one to their [facility] pharmacy and one to specialty mail order pharmacy [facility]. Arlys John voiced understanding. He states that his parents aren't home at the moment and he appreciated this call. Also reminded Arlys John of his father's appt on 05/13/23 @ 12:30 here for labs, Clinic visit with Dr. Leonides Schanz and  his injection. Arlys John states he will pass on this information.

## 2023-05-05 NOTE — Telephone Encounter (Signed)
Send rx to [facility] per wife's request

## 2023-05-13 ENCOUNTER — Inpatient Hospital Stay: Payer: Medicare Other | Attending: Nurse Practitioner

## 2023-05-13 ENCOUNTER — Inpatient Hospital Stay: Payer: Medicare Other

## 2023-05-13 ENCOUNTER — Telehealth: Payer: Self-pay | Admitting: Pharmacist

## 2023-05-13 ENCOUNTER — Other Ambulatory Visit: Payer: Self-pay

## 2023-05-13 ENCOUNTER — Inpatient Hospital Stay: Payer: Medicare Other | Admitting: Physician Assistant

## 2023-05-13 VITALS — BP 99/60 | HR 63 | Temp 97.9°F | Resp 16 | Ht 66.0 in | Wt 138.0 lb

## 2023-05-13 DIAGNOSIS — Z79899 Other long term (current) drug therapy: Secondary | ICD-10-CM | POA: Insufficient documentation

## 2023-05-13 DIAGNOSIS — Z923 Personal history of irradiation: Secondary | ICD-10-CM | POA: Diagnosis not present

## 2023-05-13 DIAGNOSIS — R590 Localized enlarged lymph nodes: Secondary | ICD-10-CM | POA: Diagnosis not present

## 2023-05-13 DIAGNOSIS — Z7901 Long term (current) use of anticoagulants: Secondary | ICD-10-CM | POA: Diagnosis not present

## 2023-05-13 DIAGNOSIS — C61 Malignant neoplasm of prostate: Secondary | ICD-10-CM

## 2023-05-13 DIAGNOSIS — E785 Hyperlipidemia, unspecified: Secondary | ICD-10-CM | POA: Insufficient documentation

## 2023-05-13 DIAGNOSIS — K219 Gastro-esophageal reflux disease without esophagitis: Secondary | ICD-10-CM | POA: Insufficient documentation

## 2023-05-13 DIAGNOSIS — I10 Essential (primary) hypertension: Secondary | ICD-10-CM | POA: Insufficient documentation

## 2023-05-13 DIAGNOSIS — Z8546 Personal history of malignant neoplasm of prostate: Secondary | ICD-10-CM

## 2023-05-13 DIAGNOSIS — C7951 Secondary malignant neoplasm of bone: Secondary | ICD-10-CM | POA: Insufficient documentation

## 2023-05-13 DIAGNOSIS — Z79818 Long term (current) use of other agents affecting estrogen receptors and estrogen levels: Secondary | ICD-10-CM | POA: Insufficient documentation

## 2023-05-13 DIAGNOSIS — M129 Arthropathy, unspecified: Secondary | ICD-10-CM | POA: Insufficient documentation

## 2023-05-13 DIAGNOSIS — I252 Old myocardial infarction: Secondary | ICD-10-CM | POA: Diagnosis not present

## 2023-05-13 DIAGNOSIS — I2510 Atherosclerotic heart disease of native coronary artery without angina pectoris: Secondary | ICD-10-CM | POA: Insufficient documentation

## 2023-05-13 LAB — CBC WITH DIFFERENTIAL (CANCER CENTER ONLY)
Abs Immature Granulocytes: 0.02 10*3/uL (ref 0.00–0.07)
Basophils Absolute: 0 10*3/uL (ref 0.0–0.1)
Basophils Relative: 1 %
Eosinophils Absolute: 0.2 10*3/uL (ref 0.0–0.5)
Eosinophils Relative: 6 %
HCT: 34.9 % — ABNORMAL LOW (ref 39.0–52.0)
Hemoglobin: 11.6 g/dL — ABNORMAL LOW (ref 13.0–17.0)
Immature Granulocytes: 1 %
Lymphocytes Relative: 10 %
Lymphs Abs: 0.4 10*3/uL — ABNORMAL LOW (ref 0.7–4.0)
MCH: 32 pg (ref 26.0–34.0)
MCHC: 33.2 g/dL (ref 30.0–36.0)
MCV: 96.1 fL (ref 80.0–100.0)
Monocytes Absolute: 0.3 10*3/uL (ref 0.1–1.0)
Monocytes Relative: 8 %
Neutro Abs: 2.7 10*3/uL (ref 1.7–7.7)
Neutrophils Relative %: 74 %
Platelet Count: 127 10*3/uL — ABNORMAL LOW (ref 150–400)
RBC: 3.63 MIL/uL — ABNORMAL LOW (ref 4.22–5.81)
RDW: 13.3 % (ref 11.5–15.5)
WBC Count: 3.6 10*3/uL — ABNORMAL LOW (ref 4.0–10.5)
nRBC: 0 % (ref 0.0–0.2)

## 2023-05-13 LAB — CMP (CANCER CENTER ONLY)
ALT: 9 U/L (ref 0–44)
AST: 16 U/L (ref 15–41)
Albumin: 3.7 g/dL (ref 3.5–5.0)
Alkaline Phosphatase: 65 U/L (ref 38–126)
Anion gap: 5 (ref 5–15)
BUN: 28 mg/dL — ABNORMAL HIGH (ref 8–23)
CO2: 27 mmol/L (ref 22–32)
Calcium: 8.9 mg/dL (ref 8.9–10.3)
Chloride: 107 mmol/L (ref 98–111)
Creatinine: 1.01 mg/dL (ref 0.61–1.24)
GFR, Estimated: >60 mL/min
Glucose, Bld: 81 mg/dL (ref 70–99)
Potassium: 4.6 mmol/L (ref 3.5–5.1)
Sodium: 139 mmol/L (ref 135–145)
Total Bilirubin: 0.3 mg/dL (ref 0.3–1.2)
Total Protein: 5.5 g/dL — ABNORMAL LOW (ref 6.5–8.1)

## 2023-05-13 MED ORDER — LEUPROLIDE ACETATE (4 MONTH) 30 MG ~~LOC~~ KIT
30.0000 mg | PACK | Freq: Once | SUBCUTANEOUS | Status: AC
  Administered 2023-05-13: 30 mg via SUBCUTANEOUS
  Filled 2023-05-13: qty 30

## 2023-05-13 NOTE — Progress Notes (Unsigned)
[facility] [phone]   [phone]  PROGRESS  NOTE  Patient Care Team: Melida Quitter, PA as PCP - General (Family Medicine) Rollene Rotunda, MD as PCP - Cardiology (Cardiology) Felicita Gage, RN Nurse Navigator as Registered Nurse (Medical Oncology) Carlean Jews, NP (Family Medicine)   CHIEF COMPLAINTS/PURPOSE OF CONSULTATION:  Advanced prostate cancer with lymphadenopathy and bone involvement diagnosed in 2018.   PRIOR THERAPY: At that time he underwent a radical prostatectomy for a Gleason score 4+3 = 7 and a PSA of 6.8.  He subsequently developed a biochemical relapse and required androgen deprivation therapy prior 2016.  He subsequently developed castration resistant disease with a rise in his PSA up to 6.63 in July 2018.  Apalutamide was started and he has been on it since that time.  His nadir of PSA was 3.06 in February 2019.  His PSA was up to 6.29 in August 2019 and in December 2019 was up to 7.87   He is status post radiation therapy to the pelvic lymph nodes for a total of 68.3 Gray in 38 fractions as well as 50 Gray in 5 fractions to the rib.  Therapy concluded in June 2022.   CURRENT THERAPY: -Apalutamide 240 mg daily started July 2018. -Eligard 30 mg every 4 months.    HISTORY OF PRESENTING ILLNESS:  Randy HERPIN Sr. 89 y.o. male returns for a follow up for history of prostate cancer. He was last seen by Dr. Clelia Croft on 10/29/2022. He presents today for his next Eligard injection. He is unaccompanied for this visit.   On exam today, Mr. Munsey reports that he ran out of his prescription of Erleada approximately 2 weeks ago and unable to get a hold of the pharmacy to send a new prescription. He reports that he is tolerating his medication without any new or concerning symptoms. His energy is stable for his age and is able to complete his baseline ADLs on his own. He denies any nausea, vomiting or abdominal pain. His bowel habits  are unchanged without recurrent episodes of diarrhea or constipation. He does have some new onset urinary incontinence. He denies any signs of overt bleeding including hematochezia or melena. He denies any new bone or back pain. He denies fevers, chills, sweats, shortness of breath, chest pain or cough. He has no other complaints. Rest of the ROS is below.  MEDICAL HISTORY:  Past Medical History:  Diagnosis Date   Acute MI [facility])    Arthritis    CAD (coronary artery disease)    Coronary artery disease    GERD (gastroesophageal reflux disease)    HLD (hyperlipidemia)    HTN (hypertension)    Hyperlipidemia    Hypertension    Prostate cancer (HCC)    Wears glasses     SURGICAL HISTORY: Past Surgical History:  Procedure Laterality Date   CORONARY ANGIOPLASTY     11/30/94 (Dr. Verdis Prime): Mild ant/anteroapical hypokinesis (known ant MI '94), EF 60%. 50-60%pLAD, 60% RI, 50% oRCA, 90% OM1 (s/p PTCA '96)   KNEE ARTHROSCOPY Right 03/01/2018   Procedure: ARTHROSCOPY KNEE;  Surgeon: Gean Birchwood, MD;  Location: [facility] OR;  Service: Orthopedics;  Laterality: Right;  debridement of medial and lateral meniscal tears   LEFT HEART CATH AND CORONARY ANGIOGRAPHY N/A 06/22/2022   Procedure: LEFT HEART CATH AND CORONARY ANGIOGRAPHY;  Surgeon: Lennette Bihari, MD;  Location: [facility] INVASIVE CV LAB;  Service: Cardiovascular;  Laterality: N/A;   Lumbar back surgery  SOCIAL HISTORY: Social History   Socioeconomic History   Marital status: Married    Spouse name: Para March   Number of children: 2   Years of education: Not on file   Highest education level: Not on file  Occupational History    Comment: retired  Tobacco Use   Smoking status: Never   Smokeless tobacco: Never  Vaping Use   Vaping Use: Never used  Substance and Sexual Activity   Alcohol use: Not Currently   Drug use: Never   Sexual activity: Not Currently  Other Topics Concern   Not on file  Social History Narrative   ** Merged  History Encounter **       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     Social Determinants of Health   Financial Resource Strain: Not on file  Food Insecurity: Not on file  Transportation Needs: No Transportation Needs (06/28/2022)   PRAPARE - Administrator, Civil Service (Medical): No    Lack of Transportation (Non-Medical): No  Physical Activity: Not on file  Stress: Not on file  Social Connections: Not on file  Intimate Partner Violence: Not on file    FAMILY HISTORY: Family History  Problem Relation Age of Onset   Alzheimer's disease Mother    Bone cancer Father    Breast cancer Neg Hx    Colon cancer Neg Hx    Prostate cancer Neg Hx    Pancreatic cancer Neg Hx     ALLERGIES:  is allergic to nsaids.  MEDICATIONS:  Current Outpatient Medications  Medication Sig Dispense Refill   acetaminophen (TYLENOL) 500 MG tablet Take 500 mg by mouth every 6 (six) hours as needed for mild pain or moderate pain.     apixaban (ELIQUIS) 2.5 MG TABS tablet Take 1 tablet (2.5 mg total) by mouth 2 (two) times daily. 180 tablet 1   ASPERCREME LIDOCAINE EX Apply 1 application topically daily as needed (pain).     Calcium Carb-Cholecalciferol (CALCIUM 1000 + D PO) Take 1 tablet by mouth daily.     ERLEADA 60 MG tablet Take 4 tablets (240 mg total) by mouth daily. 120 tablet 1   fish oil-omega-3 fatty acids 1000 MG capsule Take 1 g by mouth 2 (two) times daily.      fluticasone (FLONASE) 50 MCG/ACT nasal spray SPRAY 2 SPRAYS INTO EACH NOSTRIL EVERY DAY 48 mL 1   furosemide (LASIX) 20 MG tablet Take 1 tablet (20 mg total) by mouth daily. 5 tablet 0   LINZESS 145 MCG CAPS capsule Take 145 mcg by mouth daily as needed (constipation).     losartan (COZAAR) 25 MG tablet Take 1 tablet (25 mg total) by mouth daily. 90 tablet 3   metoprolol tartrate (LOPRESSOR) 25 MG tablet Take 0.5 tablets (12.5 mg total) by mouth 2 (two) times daily. 90 tablet 3   niacin 500 MG tablet Take 500 mg by mouth at  bedtime.     nitroGLYCERIN (NITROSTAT) 0.4 MG SL tablet Place 1 tablet (0.4 mg total) under the tongue every 5 (five) minutes as needed for chest pain. 25 tablet 1   promethazine (PHENERGAN) 25 MG tablet TAKE 1 TABLET BY MOUTH EVERY 8 HOURS AS NEEDED FOR NAUSEA AND VOMITING 30 tablet 0   sertraline (ZOLOFT) 100 MG tablet Take 100 mg by mouth daily.     simvastatin (ZOCOR) 20 MG tablet Take 1 tablet (20 mg total) by mouth daily at 6 PM. 90 tablet 3   zolpidem (  AMBIEN) 10 MG tablet TAKE 1 TABLET BY MOUTH AT BEDTIME 30 tablet 1   No current facility-administered medications for this visit.    REVIEW OF SYSTEMS:   Constitutional: ( - ) fevers, ( - )  chills , ( - ) night sweats Eyes: ( - ) blurriness of vision, ( - ) double vision, ( - ) watery eyes Ears, nose, mouth, throat, and face: ( - ) mucositis, ( - ) sore throat Respiratory: ( - ) cough, ( - ) dyspnea, ( - ) wheezes Cardiovascular: ( - ) palpitation, ( - ) chest discomfort, ( - ) lower extremity swelling Gastrointestinal:  ( - ) nausea, ( - ) heartburn, ( - ) change in bowel habits Skin: ( - ) abnormal skin rashes Lymphatics: ( - ) new lymphadenopathy, ( - ) easy bruising Neurological: ( - ) numbness, ( - ) tingling, ( - ) new weaknesses Behavioral/Psych: ( - ) mood change, ( - ) new changes  All other systems were reviewed with the patient and are negative.  PHYSICAL EXAMINATION: ECOG PERFORMANCE STATUS: 1 - Symptomatic but completely ambulatory  Vitals:   05/13/23 1309  BP: 99/60  Pulse: 63  Resp: 16  Temp: 97.9 F (36.6 C)  SpO2: 99%   Filed Weights   05/13/23 1309  Weight: 138 lb (62.6 kg)    GENERAL: elderly appearing male in NAD  SKIN: skin color, texture, turgor are normal, no rashes or significant lesions EYES: conjunctiva are pink and non-injected, sclera clear LUNGS: clear to auscultation and percussion with normal breathing effort HEART: regular rate & rhythm and no murmurs and no lower extremity  edema Musculoskeletal: no cyanosis of digits and no clubbing  PSYCH: alert & oriented x 3, fluent speech NEURO: no focal motor/sensory deficits  LABORATORY DATA:  I have reviewed the data as listed    Latest Ref Rng & Units 05/13/2023   12:26 PM 10/29/2022    3:03 PM 07/10/2022   11:26 PM  CBC  WBC 4.0 - 10.5 K/uL 3.6  5.5  5.9   Hemoglobin 13.0 - 17.0 g/dL 40.9  81.1  91.4   Hematocrit 39.0 - 52.0 % 34.9  36.4  38.5   Platelets 150 - 400 K/uL 127  160  192        Latest Ref Rng & Units 05/13/2023   12:26 PM 10/29/2022    3:03 PM 07/10/2022   11:26 PM  CMP  Glucose 70 - 99 mg/dL 81  91  782   BUN 8 - 23 mg/dL 28  18  19    Creatinine 0.61 - 1.24 mg/dL 9.56  2.13  0.86   Sodium 135 - 145 mmol/L 139  139  139   Potassium 3.5 - 5.1 mmol/L 4.6  4.5  3.6   Chloride 98 - 111 mmol/L 107  106  105   CO2 22 - 32 mmol/L 27  28  22    Calcium 8.9 - 10.3 mg/dL 8.9  9.1  9.6   Total Protein 6.5 - 8.1 g/dL 5.5  6.4    Total Bilirubin 0.3 - 1.2 mg/dL 0.3  0.4    Alkaline Phos 38 - 126 U/L 65  67    AST 15 - 41 U/L 16  14    ALT 0 - 44 U/L 9  7     ASSESSMENT & PLAN Lorin Picket Liller Sr. Is a 89 y.o. male who presents to the clinic for evaluation for advanced prostate cancer involving  his lymph nodes and bones.   #Metastatic prostate cancer involving lymph nodes and bone: --Current regimen includes Apalutamide (Erleada) 240 mg daily started July 2018. Amd Eligard 30 mg every 4 months.   --Labs from today were reviewed and adequate for treatment. WBC 3.6, Hgb 11.6, Plt 127K, Creatinine and LFTs normal. PSA level pending. Most recent PSA from 10/29/2022 was trending down to 5.3.  --Continue with treatment Eligard injection as planned today. Continue with Erleada without any dose modifications.  --RTC in 4 months with labs, follow up and Eligard injection   #Cytopenias: --WBC 3.6, Hgb 11.6, Plt 127. Have oscillated in the past.  --Monitor for now. Advised to take multivitamin.   No orders of  the defined types were placed in this encounter.   All questions were answered. The patient knows to call the clinic with any problems, questions or concerns.  I have spent a total of 25 minutes minutes of face-to-face and non-face-to-face time, preparing to see the patient, performing a medically appropriate examination, counseling and educating the patient,  documenting clinical information in the electronic health record, and care coordination.   Georga Kaufmann, PA-C Department of Hematology/Oncology [facility] Phone: [phone]  Patient was seen with Dr. Mosetta Putt.

## 2023-05-13 NOTE — Telephone Encounter (Signed)
Oral Chemotherapy Pharmacist Encounter   Notified by Georga Kaufmann, PA-C that patient has been having issues with scheduling Erleada through the [facility] patient assistance program.   Jalene [facility] PAP (tele: [phone], option #2) and spoke with representative that stated they have tried reaching out to patient to schedule shipment but have not had any answer or call back. Was able to schedule on patient's behalf Erleada shipment, which will be delivered to patient's home on 05/17/23.  Lenord Carbo, PharmD, BCPS, BCOP Hematology/Oncology Clinical Pharmacist [facility] Oral Chemotherapy Navigation Clinics [phone] 05/13/2023 2:10 PM

## 2023-05-14 ENCOUNTER — Other Ambulatory Visit: Payer: Self-pay | Admitting: Nurse Practitioner

## 2023-05-14 LAB — PROSTATE-SPECIFIC AG, SERUM (LABCORP): Prostate Specific Ag, Serum: 3.3 ng/mL (ref 0.0–4.0)

## 2023-05-15 ENCOUNTER — Encounter: Payer: Self-pay | Admitting: Hematology and Oncology

## 2023-05-16 ENCOUNTER — Telehealth: Payer: Self-pay | Admitting: Physician Assistant

## 2023-05-16 ENCOUNTER — Other Ambulatory Visit: Payer: Self-pay | Admitting: Nurse Practitioner

## 2023-05-16 ENCOUNTER — Telehealth: Payer: Self-pay

## 2023-05-16 NOTE — Telephone Encounter (Signed)
Patient spouse is aware of upcoming appointments

## 2023-05-16 NOTE — Telephone Encounter (Signed)
-----   Message from Briant Cedar, PA-C sent at 05/15/2023  8:41 PM EDT ----- Please notify patient that PSA level has improved back to normal range.    ----- Message ----- From: Interface, Lab In [place] Sent: 05/13/2023  12:28 PM EDT To: Briant Cedar, PA-C

## 2023-05-16 NOTE — Telephone Encounter (Signed)
Pt advised with VU 

## 2023-05-18 ENCOUNTER — Other Ambulatory Visit: Payer: Self-pay | Admitting: Nurse Practitioner

## 2023-05-19 ENCOUNTER — Other Ambulatory Visit: Payer: Self-pay | Admitting: Nurse Practitioner

## 2023-06-08 ENCOUNTER — Other Ambulatory Visit: Payer: Self-pay | Admitting: Nurse Practitioner

## 2023-06-08 DIAGNOSIS — F5101 Primary insomnia: Secondary | ICD-10-CM

## 2023-06-09 ENCOUNTER — Ambulatory Visit: Payer: Medicare Other | Admitting: Family Medicine

## 2023-06-09 ENCOUNTER — Encounter: Payer: Self-pay | Admitting: Family Medicine

## 2023-06-09 ENCOUNTER — Ambulatory Visit (INDEPENDENT_AMBULATORY_CARE_PROVIDER_SITE_OTHER): Payer: Medicare Other | Admitting: Family Medicine

## 2023-06-09 VITALS — BP 105/65 | HR 73 | Ht 66.0 in | Wt 138.3 lb

## 2023-06-09 DIAGNOSIS — G629 Polyneuropathy, unspecified: Secondary | ICD-10-CM | POA: Diagnosis not present

## 2023-06-09 DIAGNOSIS — K4090 Unilateral inguinal hernia, without obstruction or gangrene, not specified as recurrent: Secondary | ICD-10-CM

## 2023-06-09 DIAGNOSIS — R32 Unspecified urinary incontinence: Secondary | ICD-10-CM | POA: Insufficient documentation

## 2023-06-09 DIAGNOSIS — N3942 Incontinence without sensory awareness: Secondary | ICD-10-CM

## 2023-06-09 NOTE — Patient Instructions (Addendum)
It was nice to see you today,  We addressed the following topics today: -We will order some lab test for evaluating your neuropathy.  You can schedule a lab appointment to have these done at your convenience - Simethicone is an over-the-counter ingredient that can help with gas.  It is in [employer] - For incontinence you should reach out to your urologist.  If they say you need a new referral let us know and we can send it in - That lump in your groin is a hernia.  These often require surgery but sometimes patients are not good surgical candidates.  You may benefit from talking to a general surgeon to see if you are a good candidate.  If you would like a referral let us know - If the hernia ever becomes painful, swollen, or red or discolored you should go to the hospital or emergency department because this could be a life-threatening emergency  Have a great day,  Frederic Jericho, MD

## 2023-06-09 NOTE — Progress Notes (Signed)
Established Patient Office Visit  Subjective   Patient ID: Randy OLENIK Sr., male    DOB: 1930-07-07  Age: 89 y.o. MRN: [id number]  Chief Complaint  Patient presents with   Medical Management of Chronic Issues    HPI  Metastatic prostate cancer-patient currently sees oncologist.  Has not seen his urologist since send starting therapy with the oncologist.  Neuropathy-for the last 6 months patient has noticed "tingling" in his toes bilaterally.  Not as bad in the morning or before bed.  No numbness.  No difficulty walking.  Seems to be getting better he states.  Patient has been plaints of increased flatulence recently.  No changes to his diet.  Patient complaining of a bump in his groin on the left side.  States that he noticed it about 1 month ago.  It goes away when he lays down and wakes up at night.  Is not painful.  No nausea or vomiting.  Has not had any swelling, erythema.   The ASCVD Risk score (Arnett DK, et al., 2019) failed to calculate for the following reasons:   The 2019 ASCVD risk score is only valid for ages 45 to 61   The patient has a prior MI or stroke diagnosis     ROS    Objective:     BP 105/65   Pulse 73   Ht 5\' 6"  (1.676 m)   Wt 138 lb 4.8 oz (62.7 kg)   SpO2 100%   BMI 22.32 kg/m    Physical Exam General: Alert, oriented. GU: Large inguinal hernia on the left side palpated.  Nontender.  No swelling, no erythema. Extremities: Normal sensation in the feet.  Normal pulses.  No ulcerations or skin lesions.   No results found for any visits on 06/09/23.        Assessment & Plan:   Neuropathy Assessment & Plan: Not diabetic.  Possibly medication induced.  Is mild and improving.  Normal foot exam.  Will get B12, magnesium BMP.  Would use caution prior to starting a medication for neuropathy given his age.  Orders: -     B12 and Folate Panel; Future -     Magnesium; Future -     Basic metabolic panel; Future  Unilateral inguinal  hernia without obstruction or gangrene, recurrence not specified Assessment & Plan: Patient has a large inguinal hernia on the left side.  Appears to have formed in the last month, per the patient.  Is reportedly reducible at home.  Discussed with patient treatment options for this.  Discussed that he may not be a great surgical candidate given his age and other comorbidities such as metastatic cancer and A-fib.  Offered general surgery referral regardless so that he could speak to them about other options.  Nonsurgical options limited but hernia belt or truss.  Discussed that he would likely need a medical provider to show him how to wear them.  Discussed red flag symptoms including pain, nausea vomiting, swelling, discoloration and how this can represent a medical emergency.  Patient voiced understanding.  Patient declined referral at this time.   Urinary incontinence without sensory awareness Assessment & Plan: Patient states he "leaks occasionally" during the daytime.  Has no issues with urination itself.  Does not realize he is leaking urine.  Has a urologist that he was followed in the past.  Encourage patient to reach out to his urologist office to schedule an appointment to discuss this.  Can also send in new  urology referral if needed.      Return in about 2 months (around 08/09/2023) for Neuropathy.    Randy Kitty, MD

## 2023-06-09 NOTE — Assessment & Plan Note (Signed)
Not diabetic.  Possibly medication induced.  Is mild and improving.  Normal foot exam.  Will get B12, magnesium BMP.  Would use caution prior to starting a medication for neuropathy given his age.

## 2023-06-09 NOTE — Progress Notes (Deleted)
   Established Patient Office Visit  Subjective   Patient ID: Randy DIBERARDINO Sr., male    DOB: 10-Apr-1930  Age: 89 y.o. MRN: [id number]  No chief complaint on file.   HPI  A-fib-Eliquis, losartan, metoprolol  Metastatic prostate cancer  Goals of care   The ASCVD Risk score (Arnett DK, et al., 2019) failed to calculate for the following reasons:   The 2019 ASCVD risk score is only valid for ages 89 to 65   The patient has a prior MI or stroke diagnosis   {History (Optional):23778}  ROS    Objective:     There were no vitals taken for this visit. {Vitals History (Optional):23777}  Physical Exam   No results found for any visits on 06/09/23.  {Labs (Optional):23779}      Assessment & Plan:   There are no diagnoses linked to this encounter.   No follow-ups on file.    [name], MD

## 2023-06-09 NOTE — Assessment & Plan Note (Signed)
Patient has a large inguinal hernia on the left side.  Appears to have formed in the last month, per the patient.  Is reportedly reducible at home.  Discussed with patient treatment options for this.  Discussed that he may not be a great surgical candidate given his age and other comorbidities such as metastatic cancer and A-fib.  Offered general surgery referral regardless so that he could speak to them about other options.  Nonsurgical options limited but hernia belt or truss.  Discussed that he would likely need a medical provider to show him how to wear them.  Discussed red flag symptoms including pain, nausea vomiting, swelling, discoloration and how this can represent a medical emergency.  Patient voiced understanding.  Patient declined referral at this time.

## 2023-06-09 NOTE — Assessment & Plan Note (Signed)
Patient states he "leaks occasionally" during the daytime.  Has no issues with urination itself.  Does not realize he is leaking urine.  Has a urologist that he was followed in the past.  Encourage patient to reach out to his urologist office to schedule an appointment to discuss this.  Can also send in new urology referral if needed.

## 2023-06-20 ENCOUNTER — Other Ambulatory Visit: Payer: Self-pay | Admitting: Family Medicine

## 2023-06-28 ENCOUNTER — Ambulatory Visit (INDEPENDENT_AMBULATORY_CARE_PROVIDER_SITE_OTHER): Payer: Medicare Other

## 2023-06-28 VITALS — Ht 66.0 in | Wt 138.0 lb

## 2023-06-28 DIAGNOSIS — Z0000 Encounter for general adult medical examination without abnormal findings: Secondary | ICD-10-CM | POA: Diagnosis not present

## 2023-06-28 NOTE — Progress Notes (Signed)
Subjective:   Randy SOWELLS Sr. is a 89 y.o. male who presents for Medicare Annual/Subsequent preventive examination.  Visit Complete: Virtual  I connected with  Randy NEUFER Sr. on 06/28/23 by a audio enabled telemedicine application and verified that I am speaking with the correct person using two identifiers.  Patient Location: Home  Provider Location: Home Office  I discussed the limitations of evaluation and management by telemedicine. The patient expressed understanding and agreed to proceed.   Review of Systems    Vital Signs: Unable to obtain new vitals due to this being a telehealth visit.  Cardiac Risk Factors include: advanced age (>14men, >69 women);male gender;hypertension     Objective:    Today's Vitals   06/28/23 1135  Weight: 138 lb (62.6 kg)  Height: 5\' 6"  (1.676 m)   Body mass index is 22.27 kg/m.     06/28/2023   11:46 AM 07/10/2022   11:23 PM 06/21/2022    6:00 PM 06/21/2022    5:01 AM 06/02/2021    8:43 AM 01/16/2021   12:19 PM 09/16/2020    2:30 PM  Advanced Directives  Does Patient Have a Medical Advance Directive? No No No No No No No  Would patient like information on creating a medical advance directive? No - Patient declined  No - Patient declined  No - Patient declined No - Patient declined No - Patient declined    Current Medications (verified) Outpatient Encounter Medications as of 06/28/2023  Medication Sig   acetaminophen (TYLENOL) 500 MG tablet Take 500 mg by mouth every 6 (six) hours as needed for mild pain or moderate pain.   apixaban (ELIQUIS) 2.5 MG TABS tablet Take 1 tablet (2.5 mg total) by mouth 2 (two) times daily.   ASPERCREME LIDOCAINE EX Apply 1 application topically daily as needed (pain).   Calcium Carb-Cholecalciferol (CALCIUM 1000 + D PO) Take 1 tablet by mouth daily.   ERLEADA 60 MG tablet Take 4 tablets (240 mg total) by mouth daily.   fish oil-omega-3 fatty acids 1000 MG capsule Take 1 g by mouth 2 (two) times  daily.    fluticasone (FLONASE) 50 MCG/ACT nasal spray SPRAY 2 SPRAYS INTO EACH NOSTRIL EVERY DAY   furosemide (LASIX) 20 MG tablet Take 1 tablet (20 mg total) by mouth daily.   LINZESS 145 MCG CAPS capsule Take 145 mcg by mouth daily as needed (constipation).   losartan (COZAAR) 25 MG tablet Take 1 tablet (25 mg total) by mouth daily.   metoprolol tartrate (LOPRESSOR) 25 MG tablet Take 0.5 tablets (12.5 mg total) by mouth 2 (two) times daily.   niacin 500 MG tablet Take 500 mg by mouth at bedtime.   nitroGLYCERIN (NITROSTAT) 0.4 MG SL tablet Place 1 tablet (0.4 mg total) under the tongue every 5 (five) minutes as needed for chest pain.   promethazine (PHENERGAN) 25 MG tablet TAKE 1 TABLET BY MOUTH EVERY 8 HOURS AS NEEDED FOR NAUSEA AND VOMITING   sertraline (ZOLOFT) 100 MG tablet TAKE 1 TABLET BY MOUTH ONCE DAILY . APPOINTMENT REQUIRED FOR FUTURE REFILLS   simvastatin (ZOCOR) 20 MG tablet Take 1 tablet (20 mg total) by mouth daily at 6 PM.   zolpidem (AMBIEN) 10 MG tablet TAKE 1 TABLET BY MOUTH AT BEDTIME   No facility-administered encounter medications on file as of 06/28/2023.    Allergies (verified) Nsaids   History: Past Medical History:  Diagnosis Date   Acute MI (HCC)    Arthritis  CAD (coronary artery disease)    Coronary artery disease    GERD (gastroesophageal reflux disease)    HLD (hyperlipidemia)    HTN (hypertension)    Hyperlipidemia    Hypertension    Prostate cancer (HCC)    Wears glasses    Past Surgical History:  Procedure Laterality Date   CORONARY ANGIOPLASTY     11/30/94 (Dr. Verdis Prime): Mild ant/anteroapical hypokinesis (known ant MI '94), EF 60%. 50-60%pLAD, 60% RI, 50% oRCA, 90% OM1 (s/p PTCA '96)   KNEE ARTHROSCOPY Right 03/01/2018   Procedure: ARTHROSCOPY KNEE;  Surgeon: Gean Birchwood, MD;  Location: [facility] OR;  Service: Orthopedics;  Laterality: Right;  debridement of medial and lateral meniscal tears   LEFT HEART CATH AND CORONARY ANGIOGRAPHY N/A  06/22/2022   Procedure: LEFT HEART CATH AND CORONARY ANGIOGRAPHY;  Surgeon: Lennette Bihari, MD;  Location: [facility];  Service: Cardiovascular;  Laterality: N/A;   Lumbar back surgery     Family History  Problem Relation Age of Onset   Alzheimer's disease Mother    Bone cancer Father    Breast cancer Neg Hx    Colon cancer Neg Hx    Prostate cancer Neg Hx    Pancreatic cancer Neg Hx    Social History   Socioeconomic History   Marital status: Married    Spouse name: Para March   Number of children: 2   Years of education: Not on file   Highest education level: Not on file  Occupational History    Comment: retired  Tobacco Use   Smoking status: Never   Smokeless tobacco: Never  Vaping Use   Vaping status: Never Used  Substance and Sexual Activity   Alcohol use: Not Currently   Drug use: Never   Sexual activity: Not Currently  Other Topics Concern   Not on file  Social History Narrative   ** Merged History Encounter **       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     Social Determinants of Health   Financial Resource Strain: Low Risk  (06/28/2023)   Overall Financial Resource Strain (CARDIA)    Difficulty of Paying Living Expenses: Not hard at all  Food Insecurity: No Food Insecurity (06/28/2023)   Hunger Vital Sign    Worried About Running Out of Food in the Last Year: Never true    Randy Out of Food in the Last Year: Never true  Transportation Needs: No Transportation Needs (06/28/2023)   PRAPARE - Administrator, Civil Service (Medical): No    Lack of Transportation (Non-Medical): No  Physical Activity: Sufficiently Active (06/28/2023)   Exercise Vital Sign    Days of Exercise per Week: 7 days    Minutes of Exercise per Session: 30 min  Stress: No Stress Concern Present (06/28/2023)   [employer] of Occupational Health - Occupational Stress Questionnaire    Feeling of Stress : Not at all  Social Connections: Socially Integrated (06/28/2023)   Social  Connection and Isolation Panel [NHANES]    Frequency of Communication with Friends and Family: More than three times a week    Frequency of Social Gatherings with Friends and Family: More than three times a week    Attends Religious Services: More than 4 times per year    Active Member of [employer] or Organizations: Yes    Attends Engineer, structural: More than 4 times per year    Marital Status: Married    Tobacco Counseling Counseling  given: Not Answered   Clinical Intake:  Pre-visit preparation completed: Yes  Pain : No/denies pain     BMI - recorded: 22.27 Nutritional Risks: None Diabetes: No  How often do you need to have someone help you when you read instructions, pamphlets, or other written materials from your doctor or pharmacy?: 1 - Never  Interpreter Needed?: No  Information entered by :: Theresa Mulligan LPN   Activities of Daily Living    06/28/2023   11:45 AM  In your present state of health, do you have any difficulty performing the following activities:  Hearing? 1  Comment Patient does not wear hearing aids  Vision? 0  Difficulty concentrating or making decisions? 0  Walking or climbing stairs? 0  Dressing or bathing? 0  Doing errands, shopping? 0  Preparing Food and eating ? N  Using the Toilet? N  In the past six months, have you accidently leaked urine? N  Do you have problems with loss of bowel control? N  Managing your Medications? N  Managing your Finances? N  Housekeeping or managing your Housekeeping? N    Patient Care Team: Melida Quitter, PA as PCP - General (Family Medicine) Rollene Rotunda, MD as PCP - Cardiology (Cardiology) Felicita Gage, RN Nurse Navigator as Registered Nurse (Medical Oncology) Carlean Jews, NP (Family Medicine)  Indicate any recent Medical Services you may have received from other than [facility] providers in the past year (date may be approximate).     Assessment:   This is a routine wellness  examination for [facility].  Hearing/Vision screen Hearing Screening - Comments:: Denies hearing difficulties    Vision Screening - Comments:: Wears rx glasses - up to date with routine eye exams with [facility]  Dietary issues and exercise activities discussed:     Goals Addressed               This Visit's Progress     Stay healthy (pt-stated)        Continue to work on pond       Depression Screen    06/28/2023   11:42 AM 06/09/2023    3:07 PM 07/07/2022    2:47 PM 12/16/2021    2:55 PM 06/30/2021    3:10 PM 02/03/2021    4:03 PM 06/07/2019    2:24 PM  PHQ 2/9 Scores  PHQ - 2 Score 0 0 0 0 0 0 0  PHQ- 9 Score 0 0 3 1 0      Fall Risk    06/28/2023   11:45 AM 06/09/2023    3:06 PM 12/16/2021    2:55 PM 02/03/2021    4:03 PM 06/07/2019    2:24 PM  Fall Risk   Falls in the past year? 0 1 0 0 1  Number falls in past yr: 0 0 0 0 0  Injury with Fall? 0 0 0 0 0  Risk for fall due to : No Fall Risks Other (Comment)     Follow up Falls prevention discussed Falls evaluation completed Falls evaluation completed      MEDICARE RISK AT HOME: Medicare Risk at Home Any stairs in or around the home?: Yes If so, are there any without handrails?: No Home free of loose throw rugs in walkways, pet beds, electrical cords, etc?: Yes Adequate lighting in your home to reduce risk of falls?: Yes Life alert?: No Use of a cane, walker or w/c?: No Grab bars in the bathroom?: No Shower chair or  bench in shower?: Yes Elevated toilet seat or a handicapped toilet?: No  TIMED UP AND GO:  Was the test performed?  No    Cognitive Function:        06/28/2023   11:47 AM  6CIT Screen  What Year? --  What month? --  What time? --  Count back from 20 --  Months in reverse --  Repeat phrase --    Immunizations Immunization History  Administered Date(s) Administered   Fluad Quad(high Dose 65+) 10/14/2021   Influenza Split 08/07/2012   Influenza Whole 11/08/2005   Influenza, High Dose  Seasonal PF 08/21/2013, 09/23/2017, 08/17/2018, 07/02/2019   Influenza,inj,Quad PF,6+ Mos 07/18/2014   Influenza-Unspecified 09/04/2015   PFIZER(Purple Top)SARS-COV-2 Vaccination 01/10/2020, 02/05/2020, 09/20/2020   Pneumococcal Conjugate-13 06/18/2015   Pneumococcal Polysaccharide-23 11/08/2006   Td 11/08/2002   Tdap 03/24/2018, 07/11/2022    TDAP status: Up to date  Flu Vaccine status: Due, Education has been provided regarding the importance of this vaccine. Advised may receive this vaccine at local pharmacy or Health Dept. Aware to provide a copy of the vaccination record if obtained from local pharmacy or Health Dept. Verbalized acceptance and understanding.  Pneumococcal vaccine status: Up to date  Covid-19 vaccine status: Declined, Education has been provided regarding the importance of this vaccine but patient still declined. Advised may receive this vaccine at local pharmacy or Health Dept.or vaccine clinic. Aware to provide a copy of the vaccination record if obtained from local pharmacy or Health Dept. Verbalized acceptance and understanding.  Qualifies for Shingles Vaccine? Yes   Zostavax completed No   Shingrix Completed?: No.    Education has been provided regarding the importance of this vaccine. Patient has been advised to call insurance company to determine out of pocket expense if they have not yet received this vaccine. Advised may also receive vaccine at local pharmacy or Health Dept. Verbalized acceptance and understanding.  Screening Tests Health Maintenance  Topic Date Due   Zoster Vaccines- Shingrix (1 of 2) Never done   COVID-19 Vaccine (4 - 2023-24 season) 07/09/2022   INFLUENZA VACCINE  06/09/2023   Medicare Annual Wellness (AWV)  06/27/2024   DTaP/Tdap/Td (4 - Td or Tdap) 07/11/2032   Pneumonia Vaccine 67+ Years old  Completed   HPV VACCINES  Aged Out    Health Maintenance  Health Maintenance Due  Topic Date Due   Zoster Vaccines- Shingrix (1 of 2)  Never done   COVID-19 Vaccine (4 - 2023-24 season) 07/09/2022   INFLUENZA VACCINE  06/09/2023    Colorectal cancer screening: No longer required.   Lung Cancer Screening: (Low Dose CT Chest recommended if Age 65-80 years, 20 pack-year currently smoking OR have quit w/in 15years.) does not qualify.     Additional Screening:  Hepatitis C Screening: does not qualify;   Vision Screening: Recommended annual ophthalmology exams for early detection of glaucoma and other disorders of the eye. Is the patient up to date with their annual eye exam?  Yes  Who is the provider or what is the name of the office in which the patient attends annual eye exams? [facility] If pt is not established with a provider, would they like to be referred to a provider to establish care? No .   Dental Screening: Recommended annual dental exams for proper oral hygiene    Community Resource Referral / Chronic Care Management:  CRR required this visit?  No   CCM required this visit?  No  Plan:     I have personally reviewed and noted the following in the patient's chart:   Medical and social history Use of alcohol, tobacco or illicit drugs  Current medications and supplements including opioid prescriptions. Patient is not currently taking opioid prescriptions. Functional ability and status Nutritional status Physical activity Advanced directives List of other physicians Hospitalizations, surgeries, and ER visits in previous 12 months Vitals Screenings to include cognitive, depression, and falls Referrals and appointments  In addition, I have reviewed and discussed with patient certain preventive protocols, quality metrics, and best practice recommendations. A written personalized care plan for preventive services as well as general preventive health recommendations were provided to patient.     Tillie Rung, LPN   9/60/4540   After Visit Summary: (MyChart) Due to this being a telephonic  visit, the after visit summary with patients personalized plan was offered to patient via MyChart   Nurse Notes: None

## 2023-06-28 NOTE — Patient Instructions (Addendum)
Randy [name] , Thank you for taking time to come for your Medicare Wellness Visit. I appreciate your ongoing commitment to your health goals. Please review the following plan we discussed and let me know if I can assist you in the future.   Referrals/Orders/Follow-Ups/Clinician Recommendations:   This is a list of the screening recommended for you and due dates:  Health Maintenance  Topic Date Due   Zoster (Shingles) Vaccine (1 of 2) Never done   COVID-19 Vaccine (4 - 2023-24 season) 07/09/2022   Flu Shot  06/09/2023   Medicare Annual Wellness Visit  06/27/2024   DTaP/Tdap/Td vaccine (4 - Td or Tdap) 07/11/2032   Pneumonia Vaccine  Completed   HPV Vaccine  Aged Out    Advanced directives: (Declined) Advance directive discussed with you today. Even though you declined this today, please call our office should you change your mind, and we can give you the proper paperwork for you to fill out.  Next Medicare Annual Wellness Visit scheduled for next year: Yes  Preventive Care 54 Years and Older, Male  Preventive care refers to lifestyle choices and visits with your health care provider that can promote health and wellness. What does preventive care include? A yearly physical exam. This is also called an annual well check. Dental exams once or twice a year. Routine eye exams. Ask your health care provider how often you should have your eyes checked. Personal lifestyle choices, including: Daily care of your teeth and gums. Regular physical activity. Eating a healthy diet. Avoiding tobacco and drug use. Limiting alcohol use. Practicing safe sex. Taking low doses of aspirin every day. Taking vitamin and mineral supplements as recommended by your health care provider. What happens during an annual well check? The services and screenings done by your health care provider during your annual well check will depend on your age, overall health, lifestyle risk factors, and family history of  disease. Counseling  Your health care provider may ask you questions about your: Alcohol use. Tobacco use. Drug use. Emotional well-being. Home and relationship well-being. Sexual activity. Eating habits. History of falls. Memory and ability to understand (cognition). Work and work Astronomer. Screening  You may have the following tests or measurements: Height, weight, and BMI. Blood pressure. Lipid and cholesterol levels. These may be checked every 5 years, or more frequently if you are over 69 years old. Skin check. Lung cancer screening. You may have this screening every year starting at age 83 if you have a 30-pack-year history of smoking and currently smoke or have quit within the past 15 years. Fecal occult blood test (FOBT) of the stool. You may have this test every year starting at age 77. Flexible sigmoidoscopy or colonoscopy. You may have a sigmoidoscopy every 5 years or a colonoscopy every 10 years starting at age 56. Prostate cancer screening. Recommendations will vary depending on your family history and other risks. Hepatitis C blood test. Hepatitis B blood test. Sexually transmitted disease (STD) testing. Diabetes screening. This is done by checking your blood sugar (glucose) after you have not eaten for a while (fasting). You may have this done every 1-3 years. Abdominal aortic aneurysm (AAA) screening. You may need this if you are a current or former smoker. Osteoporosis. You may be screened starting at age 58 if you are at high risk. Talk with your health care provider about your test results, treatment options, and if necessary, the need for more tests. Vaccines  Your health care provider may recommend certain  vaccines, such as: Influenza vaccine. This is recommended every year. Tetanus, diphtheria, and acellular pertussis (Tdap, Td) vaccine. You may need a Td booster every 10 years. Zoster vaccine. You may need this after age 79. Pneumococcal 13-valent  conjugate (PCV13) vaccine. One dose is recommended after age 32. Pneumococcal polysaccharide (PPSV23) vaccine. One dose is recommended after age 68. Talk to your health care provider about which screenings and vaccines you need and how often you need them. This information is not intended to replace advice given to you by your health care provider. Make sure you discuss any questions you have with your health care provider. Document Released: 11/21/2015 Document Revised: 07/14/2016 Document Reviewed: 08/26/2015 Elsevier Interactive Patient Education  2017 [employer].  Fall Prevention in the Home Falls can cause injuries. They can happen to people of all ages. There are many things you can do to make your home safe and to help prevent falls. What can I do on the outside of my home? Regularly fix the edges of walkways and driveways and fix any cracks. Remove anything that might make you trip as you walk through a door, such as a raised step or threshold. Trim any bushes or trees on the path to your home. Use bright outdoor lighting. Clear any walking paths of anything that might make someone trip, such as rocks or tools. Regularly check to see if handrails are loose or broken. Make sure that both sides of any steps have handrails. Any raised decks and porches should have guardrails on the edges. Have any leaves, snow, or ice cleared regularly. Use sand or salt on walking paths during winter. Clean up any spills in your garage right away. This includes oil or grease spills. What can I do in the bathroom? Use night lights. Install grab bars by the toilet and in the tub and shower. Do not use towel bars as grab bars. Use non-skid mats or decals in the tub or shower. If you need to sit down in the shower, use a plastic, non-slip stool. Keep the floor dry. Clean up any water that spills on the floor as soon as it happens. Remove soap buildup in the tub or shower regularly. Attach bath mats  securely with double-sided non-slip rug tape. Do not have throw rugs and other things on the floor that can make you trip. What can I do in the bedroom? Use night lights. Make sure that you have a light by your bed that is easy to reach. Do not use any sheets or blankets that are too big for your bed. They should not hang down onto the floor. Have a firm chair that has side arms. You can use this for support while you get dressed. Do not have throw rugs and other things on the floor that can make you trip. What can I do in the kitchen? Clean up any spills right away. Avoid walking on wet floors. Keep items that you use a lot in easy-to-reach places. If you need to reach something above you, use a strong step stool that has a grab bar. Keep electrical cords out of the way. Do not use floor polish or wax that makes floors slippery. If you must use wax, use non-skid floor wax. Do not have throw rugs and other things on the floor that can make you trip. What can I do with my stairs? Do not leave any items on the stairs. Make sure that there are handrails on both sides of the stairs  and use them. Fix handrails that are broken or loose. Make sure that handrails are as long as the stairways. Check any carpeting to make sure that it is firmly attached to the stairs. Fix any carpet that is loose or worn. Avoid having throw rugs at the top or bottom of the stairs. If you do have throw rugs, attach them to the floor with carpet tape. Make sure that you have a light switch at the top of the stairs and the bottom of the stairs. If you do not have them, ask someone to add them for you. What else can I do to help prevent falls? Wear shoes that: Do not have high heels. Have rubber bottoms. Are comfortable and fit you well. Are closed at the toe. Do not wear sandals. If you use a stepladder: Make sure that it is fully opened. Do not climb a closed stepladder. Make sure that both sides of the stepladder  are locked into place. Ask someone to hold it for you, if possible. Clearly mark and make sure that you can see: Any grab bars or handrails. First and last steps. Where the edge of each step is. Use tools that help you move around (mobility aids) if they are needed. These include: Canes. Walkers. Scooters. Crutches. Turn on the lights when you go into a dark area. Replace any light bulbs as soon as they burn out. Set up your furniture so you have a clear path. Avoid moving your furniture around. If any of your floors are uneven, fix them. If there are any pets around you, be aware of where they are. Review your medicines with your doctor. Some medicines can make you feel dizzy. This can increase your chance of falling. Ask your doctor what other things that you can do to help prevent falls. This information is not intended to replace advice given to you by your health care provider. Make sure you discuss any questions you have with your health care provider. Document Released: 08/21/2009 Document Revised: 04/01/2016 Document Reviewed: 11/29/2014 Elsevier Interactive Patient Education  2017 [employer].

## 2023-07-06 ENCOUNTER — Other Ambulatory Visit: Payer: Self-pay | Admitting: Family Medicine

## 2023-07-06 DIAGNOSIS — F5101 Primary insomnia: Secondary | ICD-10-CM

## 2023-07-19 ENCOUNTER — Other Ambulatory Visit: Payer: Self-pay | Admitting: Cardiology

## 2023-07-28 ENCOUNTER — Other Ambulatory Visit: Payer: Self-pay | Admitting: Cardiology

## 2023-07-28 ENCOUNTER — Other Ambulatory Visit: Payer: Self-pay | Admitting: Family Medicine

## 2023-08-09 ENCOUNTER — Ambulatory Visit (INDEPENDENT_AMBULATORY_CARE_PROVIDER_SITE_OTHER): Payer: Medicare Other | Admitting: Family Medicine

## 2023-08-09 ENCOUNTER — Encounter: Payer: Self-pay | Admitting: Family Medicine

## 2023-08-09 ENCOUNTER — Other Ambulatory Visit: Payer: Self-pay | Admitting: Family Medicine

## 2023-08-09 VITALS — BP 116/67 | HR 76 | Resp 18 | Ht 66.0 in | Wt 138.0 lb

## 2023-08-09 DIAGNOSIS — G629 Polyneuropathy, unspecified: Secondary | ICD-10-CM | POA: Diagnosis not present

## 2023-08-09 DIAGNOSIS — F5101 Primary insomnia: Secondary | ICD-10-CM | POA: Diagnosis not present

## 2023-08-09 DIAGNOSIS — F325 Major depressive disorder, single episode, in full remission: Secondary | ICD-10-CM

## 2023-08-09 DIAGNOSIS — E782 Mixed hyperlipidemia: Secondary | ICD-10-CM

## 2023-08-09 DIAGNOSIS — I10 Essential (primary) hypertension: Secondary | ICD-10-CM | POA: Diagnosis not present

## 2023-08-09 MED ORDER — SIMVASTATIN 20 MG PO TABS
20.0000 mg | ORAL_TABLET | Freq: Every day | ORAL | 3 refills | Status: DC
Start: 2023-08-09 — End: 2024-07-23

## 2023-08-09 MED ORDER — SUVOREXANT 10 MG PO TABS
10.0000 mg | ORAL_TABLET | Freq: Every day | ORAL | 2 refills | Status: DC
Start: 2023-08-09 — End: 2023-08-31

## 2023-08-09 MED ORDER — SERTRALINE HCL 100 MG PO TABS: 100.0000 mg | ORAL_TABLET | Freq: Every day | ORAL | 0 refills | Status: DC

## 2023-08-09 NOTE — Assessment & Plan Note (Signed)
BP goal <130/80.  Stable, at goal.  Continue metoprolol tartrate 12.5 mg daily, losartan 25 mg daily.

## 2023-08-09 NOTE — Progress Notes (Signed)
Established Patient Office Visit  Subjective   Patient ID: Randy WOODRUM Sr., male    DOB: 25-Oct-1930  Age: 89 y.o. MRN: [id number]  Chief Complaint  Patient presents with   Peripheral Neuropathy    HPI Randy MCLOUTH Sr. is a 89 y.o. male presenting today for follow up of neuropathy.  He saw Dr. Constance Goltz on 06/09/2023 complaining of 6 months of tingling in his toes bilaterally.  This has improved since his last appointment.  Previously, it started at his ankle and tingled distally.  Now, the tingling is isolated to the tips of his toes.  Outpatient Medications Prior to Visit  Medication Sig   acetaminophen (TYLENOL) 500 MG tablet Take 500 mg by mouth every 6 (six) hours as needed for mild pain or moderate pain.   apixaban (ELIQUIS) 2.5 MG TABS tablet Take 1 tablet (2.5 mg total) by mouth 2 (two) times daily.   ASPERCREME LIDOCAINE EX Apply 1 application topically daily as needed (pain).   Calcium Carb-Cholecalciferol (CALCIUM 1000 + D PO) Take 1 tablet by mouth daily.   ERLEADA 60 MG tablet Take 4 tablets (240 mg total) by mouth daily.   fish oil-omega-3 fatty acids 1000 MG capsule Take 1 g by mouth 2 (two) times daily.    fluticasone (FLONASE) 50 MCG/ACT nasal spray SPRAY 2 SPRAYS INTO EACH NOSTRIL EVERY DAY   furosemide (LASIX) 20 MG tablet Take 1 tablet (20 mg total) by mouth daily.   LINZESS 145 MCG CAPS capsule Take 145 mcg by mouth daily as needed (constipation).   losartan (COZAAR) 25 MG tablet Take 1 tablet by mouth once daily   metoprolol tartrate (LOPRESSOR) 25 MG tablet Take 0.5 tablets (12.5 mg total) by mouth 2 (two) times daily.   niacin 500 MG tablet Take 500 mg by mouth at bedtime.   nitroGLYCERIN (NITROSTAT) 0.4 MG SL tablet Place 1 tablet (0.4 mg total) under the tongue every 5 (five) minutes as needed for chest pain.   promethazine (PHENERGAN) 25 MG tablet TAKE 1 TABLET BY MOUTH EVERY 8 HOURS AS NEEDED FOR NAUSEA AND VOMITING   [DISCONTINUED] sertraline (ZOLOFT) 100  MG tablet TAKE 1 TABLET BY MOUTH ONCE DAILY . APPOINTMENT REQUIRED FOR FUTURE REFILLS   [DISCONTINUED] simvastatin (ZOCOR) 20 MG tablet Take 1 tablet (20 mg total) by mouth daily at 6 PM.   [DISCONTINUED] zolpidem (AMBIEN) 10 MG tablet TAKE 1 TABLET BY MOUTH AT BEDTIME   No facility-administered medications prior to visit.    ROS Negative unless otherwise noted in HPI   Objective:     BP 116/67 (BP Location: Left Arm, Patient Position: Sitting, Cuff Size: Normal)   Pulse 76   Resp 18   Ht 5\' 6"  (1.676 m)   Wt 138 lb (62.6 kg)   SpO2 99%   BMI 22.27 kg/m   Physical Exam Constitutional:      General: He is not in acute distress.    Appearance: Normal appearance.  HENT:     Head: Normocephalic and atraumatic.  Cardiovascular:     Rate and Rhythm: Normal rate and regular rhythm.     Heart sounds: Normal heart sounds. No murmur heard.    No friction rub. No gallop.  Pulmonary:     Effort: Pulmonary effort is normal. No respiratory distress.     Breath sounds: Normal breath sounds. No wheezing, rhonchi or rales.  Skin:    General: Skin is warm and dry.  Neurological:     Mental  Status: He is alert and oriented to person, place, and time.  Psychiatric:        Mood and Affect: Mood normal.     Assessment & Plan:  Neuropathy Assessment & Plan: Neuropathy continues to improve.  No medication necessary at this time, will continue to monitor.   Primary insomnia Assessment & Plan: Discussed that Ambien is not recommended for anyone over 87 years old.  Patient is agreeable to trial of a different medication.  He is not concerned about next-day somnolence, so starting with suvorexant 10 mg daily.  He may increase to 20 mg daily after 1 week if starting dose proves to be ineffective.  Follow-up in 6 weeks.  Orders: -     Suvorexant; Take 1 tablet (10 mg total) by mouth at bedtime. May increase to 20 mg (2 tablets) at bedtime after 1 week if starting dose ineffective.  Dispense:  30 tablet; Refill: 2  Essential hypertension Assessment & Plan: BP goal <130/80.  Stable, at goal.  Continue metoprolol tartrate 12.5 mg daily, losartan 25 mg daily.    Mixed hyperlipidemia -     Simvastatin; Take 1 tablet (20 mg total) by mouth daily at 6 PM.  Dispense: 90 tablet; Refill: 3  Major depressive disorder with single episode, in full remission (HCC) -     Sertraline HCl; Take 1 tablet (100 mg total) by mouth daily.  Dispense: 90 tablet; Refill: 0    Return in about 6 weeks (around 09/20/2023) for follow-up for sleep.    Melida Quitter, PA

## 2023-08-09 NOTE — Patient Instructions (Signed)
STOP taking zolpidem (Ambien)  START taking suvorexant nightly at bedtime.

## 2023-08-09 NOTE — Assessment & Plan Note (Signed)
Discussed that Ambien is not recommended for anyone over 87 years old.  Patient is agreeable to trial of a different medication.  He is not concerned about next-day somnolence, so starting with suvorexant 10 mg daily.  He may increase to 20 mg daily after 1 week if starting dose proves to be ineffective.  Follow-up in 6 weeks.

## 2023-08-09 NOTE — Assessment & Plan Note (Signed)
Neuropathy continues to improve.  No medication necessary at this time, will continue to monitor.

## 2023-08-10 ENCOUNTER — Other Ambulatory Visit: Payer: Self-pay | Admitting: Family Medicine

## 2023-08-10 DIAGNOSIS — F5101 Primary insomnia: Secondary | ICD-10-CM

## 2023-08-11 ENCOUNTER — Other Ambulatory Visit: Payer: Self-pay | Admitting: Family Medicine

## 2023-08-11 ENCOUNTER — Telehealth: Payer: Self-pay

## 2023-08-11 DIAGNOSIS — F5101 Primary insomnia: Secondary | ICD-10-CM

## 2023-08-11 NOTE — Telephone Encounter (Signed)
Zolpidem not recommended for those over 87 years old, changed to suvorexant prescription at most recent appointment

## 2023-08-11 NOTE — Telephone Encounter (Signed)
Pharmacy confirmed that a PA needed for Suvorexant 10 MG TABS

## 2023-08-11 NOTE — Telephone Encounter (Signed)
[facility] was notified that insurance has approved medication. Pharmacy reran the claim and states that patient will have a $30 copay.

## 2023-08-31 ENCOUNTER — Telehealth: Payer: Self-pay | Admitting: *Deleted

## 2023-08-31 DIAGNOSIS — F5101 Primary insomnia: Secondary | ICD-10-CM

## 2023-08-31 MED ORDER — SUVOREXANT 10 MG PO TABS
10.0000 mg | ORAL_TABLET | Freq: Every day | ORAL | 2 refills | Status: DC
Start: 2023-08-31 — End: 2023-09-02

## 2023-08-31 NOTE — Telephone Encounter (Signed)
Pt wife calling to say that patient needs a refill on his sleeping medicine Survorexant   However pt is currently in the donut hole with insurance and cannot afford that and would like something else in its place.  He has one tablet left to take.

## 2023-08-31 NOTE — Telephone Encounter (Signed)
I can send in a refill, clarify if he is currently taking 1 tablet or 2 tablets at bedtime please?  I would also like to know what the monthly cost is now that he is in the donut hole.  I will send in a refill in case they would like to pick it up.  There is also a program on their website for 3 3 trials of 10 tablets each.  They can find it and apply by going online and searching "free 10-tablet trial supply of BELSOMRA"  I will also place a referral to pharmacy for additional assistance!

## 2023-09-01 NOTE — Telephone Encounter (Signed)
The cost is 114$  and he is taking 2 at bedtime.  Family have requested that something else can be called in to help with sleep.

## 2023-09-01 NOTE — Telephone Encounter (Signed)
Tried to call pt to inform him of below and get the requested information but phone only rang and no option to LVM.

## 2023-09-02 MED ORDER — LEMBOREXANT 5 MG PO TABS: 2.5000 mg | ORAL_TABLET | Freq: Every day | ORAL | 2 refills | Status: DC

## 2023-09-02 NOTE — Telephone Encounter (Signed)
I have sent in an alternative to see if it is more affordable, or they can try the Belsomra trial that I mentioned before.  Another recommendation is for them to go on the prescription [employer] website and if eligible, medications cost $60 a month.

## 2023-09-02 NOTE — Addendum Note (Signed)
Addended by: Saralyn Pilar on: 09/02/2023 11:55 AM   Modules accepted: Orders

## 2023-09-02 NOTE — Telephone Encounter (Signed)
Pt wife informed

## 2023-09-06 ENCOUNTER — Telehealth: Payer: Self-pay | Admitting: *Deleted

## 2023-09-06 ENCOUNTER — Telehealth: Payer: Self-pay

## 2023-09-06 DIAGNOSIS — F5101 Primary insomnia: Secondary | ICD-10-CM

## 2023-09-06 MED ORDER — MIRTAZAPINE 15 MG PO TABS
15.0000 mg | ORAL_TABLET | Freq: Every day | ORAL | 2 refills | Status: DC
Start: 2023-09-06 — End: 2023-12-19

## 2023-09-06 NOTE — Telephone Encounter (Signed)
Pt wife calling to say that the last sleep medicine that was sent in is still too expensive and they would like something else to be sent in.  She said this past weekend she gave him one of her mirtazapine (REMERON) 15 MG tablet and it worked, she would like a Rx of this sent in for him please to the [facility] on [address].

## 2023-09-06 NOTE — Progress Notes (Signed)
Care Guide Note  09/06/2023 Name: Randy TINGLER Sr. MRN: [id number] DOB: 20-Feb-1930  Referred by: Melida Quitter, PA Reason for referral : Care Coordination (Outreach to schedule with Pharm d )   Randy Skill Sr. is a 89 y.o. year old male who is a primary care patient of Melida Quitter, [place]. Randy Picket Franklyn Sr. was referred to the pharmacist for assistance related to  Insomnia .    An unsuccessful telephone outreach was attempted today to contact the patient who was referred to the pharmacy team for assistance with medication assistance. Additional attempts will be made to contact the patient.   Penne Lash, RMA Care Guide [facility]  [place], [place] [address] Direct Dial: [phone] [email or web address]

## 2023-09-06 NOTE — Addendum Note (Signed)
Addended by: Saralyn Pilar on: 09/06/2023 05:04 PM   Modules accepted: Orders

## 2023-09-06 NOTE — Telephone Encounter (Signed)
Meds ordered this encounter  Medications   mirtazapine (REMERON) 15 MG tablet    Sig: Take 1 tablet (15 mg total) by mouth at bedtime.    Dispense:  30 tablet    Refill:  2    Order Specific Question:   Supervising Provider    Answer:   [name] [[id number]]

## 2023-09-08 NOTE — Progress Notes (Signed)
Care Guide Note  09/08/2023 Name: Randy GAVRILOV Sr. MRN: [id number] DOB: 07-01-1930  Referred by: Melida Quitter, PA Reason for referral : Care Coordination (Outreach to schedule with Pharm d )   Randy Skill Sr. is a 89 y.o. year old male who is a primary care patient of Melida Quitter, [place]. Randy Picket Pavel Sr. was referred to the pharmacist for assistance related to HLD.    A second unsuccessful telephone outreach was attempted today to contact the patient who was referred to the pharmacy team for assistance with medication assistance. Additional attempts will be made to contact the patient.  Penne Lash, RMA Care Guide [facility]  [place], [place] [address] Direct Dial: [phone] [email or web address]

## 2023-09-15 NOTE — Progress Notes (Signed)
   Care Guide Note  09/15/2023 Name: Randy WIEBER Sr. MRN: [id number] DOB: 1930-02-12  Referred by: Melida Quitter, PA Reason for referral : Care Coordination (Outreach to schedule with Pharm d )   Randy Skill Sr. is a 89 y.o. year old male who is a primary care patient of Melida Quitter, [place]. Randy Picket Umbaugh Sr. was referred to the pharmacist for assistance related to HLD.    Successful contact was made with the patient to discuss pharmacy services including being ready for the pharmacist to call at least 5 minutes before the scheduled appointment time, to have medication bottles and any blood sugar or blood pressure readings ready for review. The patient agreed to meet with the pharmacist via with the pharmacist via telephone visit on (date/time).  09/23/2023  Penne Lash, RMA Care Guide [facility]  [place], [place] [address] Direct Dial: [phone] [email or web address]

## 2023-09-16 ENCOUNTER — Encounter: Payer: Self-pay | Admitting: Nurse Practitioner

## 2023-09-16 ENCOUNTER — Inpatient Hospital Stay: Payer: Medicare Other | Attending: Nurse Practitioner

## 2023-09-16 ENCOUNTER — Inpatient Hospital Stay (HOSPITAL_BASED_OUTPATIENT_CLINIC_OR_DEPARTMENT_OTHER): Payer: Medicare Other | Admitting: Nurse Practitioner

## 2023-09-16 ENCOUNTER — Other Ambulatory Visit: Payer: Self-pay

## 2023-09-16 ENCOUNTER — Inpatient Hospital Stay: Payer: Medicare Other

## 2023-09-16 VITALS — BP 113/71 | HR 62 | Temp 97.3°F | Resp 17 | Ht 66.0 in | Wt 139.0 lb

## 2023-09-16 DIAGNOSIS — C7951 Secondary malignant neoplasm of bone: Secondary | ICD-10-CM | POA: Diagnosis not present

## 2023-09-16 DIAGNOSIS — R9721 Rising PSA following treatment for malignant neoplasm of prostate: Secondary | ICD-10-CM

## 2023-09-16 DIAGNOSIS — Z79899 Other long term (current) drug therapy: Secondary | ICD-10-CM | POA: Diagnosis not present

## 2023-09-16 DIAGNOSIS — Z192 Hormone resistant malignancy status: Secondary | ICD-10-CM | POA: Diagnosis not present

## 2023-09-16 DIAGNOSIS — C779 Secondary and unspecified malignant neoplasm of lymph node, unspecified: Secondary | ICD-10-CM | POA: Insufficient documentation

## 2023-09-16 DIAGNOSIS — Z79818 Long term (current) use of other agents affecting estrogen receptors and estrogen levels: Secondary | ICD-10-CM | POA: Diagnosis not present

## 2023-09-16 DIAGNOSIS — C61 Malignant neoplasm of prostate: Secondary | ICD-10-CM

## 2023-09-16 DIAGNOSIS — Z7901 Long term (current) use of anticoagulants: Secondary | ICD-10-CM | POA: Diagnosis not present

## 2023-09-16 LAB — CMP (CANCER CENTER ONLY)
ALT: 11 U/L (ref 0–44)
AST: 16 U/L (ref 15–41)
Albumin: 4.1 g/dL (ref 3.5–5.0)
Alkaline Phosphatase: 78 U/L (ref 38–126)
Anion gap: 5 (ref 5–15)
BUN: 30 mg/dL — ABNORMAL HIGH (ref 8–23)
CO2: 28 mmol/L (ref 22–32)
Calcium: 9.1 mg/dL (ref 8.9–10.3)
Chloride: 106 mmol/L (ref 98–111)
Creatinine: 1.07 mg/dL (ref 0.61–1.24)
GFR, Estimated: >60 mL/min (ref >60)
Glucose, Bld: 118 mg/dL — ABNORMAL HIGH (ref 70–99)
Potassium: 4.7 mmol/L (ref 3.5–5.1)
Sodium: 139 mmol/L (ref 135–145)
Total Bilirubin: 0.4 mg/dL (ref <1.2)
Total Protein: 6.4 g/dL — ABNORMAL LOW (ref 6.5–8.1)

## 2023-09-16 LAB — CBC WITH DIFFERENTIAL (CANCER CENTER ONLY)
Abs Immature Granulocytes: 0.03 10*3/uL (ref 0.00–0.07)
Basophils Absolute: 0 10*3/uL (ref 0.0–0.1)
Basophils Relative: 1 %
Eosinophils Absolute: 0.4 10*3/uL (ref 0.0–0.5)
Eosinophils Relative: 8 %
HCT: 35.8 % — ABNORMAL LOW (ref 39.0–52.0)
Hemoglobin: 12.2 g/dL — ABNORMAL LOW (ref 13.0–17.0)
Immature Granulocytes: 1 %
Lymphocytes Relative: 15 %
Lymphs Abs: 0.8 10*3/uL (ref 0.7–4.0)
MCH: 31.7 pg (ref 26.0–34.0)
MCHC: 34.1 g/dL (ref 30.0–36.0)
MCV: 93 fL (ref 80.0–100.0)
Monocytes Absolute: 0.4 10*3/uL (ref 0.1–1.0)
Monocytes Relative: 8 %
Neutro Abs: 3.7 10*3/uL (ref 1.7–7.7)
Neutrophils Relative %: 67 %
Platelet Count: 136 10*3/uL — ABNORMAL LOW (ref 150–400)
RBC: 3.85 MIL/uL — ABNORMAL LOW (ref 4.22–5.81)
RDW: 13.1 % (ref 11.5–15.5)
WBC Count: 5.4 10*3/uL (ref 4.0–10.5)
nRBC: 0 % (ref 0.0–0.2)

## 2023-09-16 MED ORDER — LEUPROLIDE ACETATE (4 MONTH) 30 MG ~~LOC~~ KIT
30.0000 mg | PACK | Freq: Once | SUBCUTANEOUS | Status: AC
  Administered 2023-09-16: 30 mg via SUBCUTANEOUS

## 2023-09-16 MED ORDER — ERLEADA 60 MG PO TABS: 240.0000 mg | ORAL_TABLET | Freq: Every day | ORAL | 1 refills | Status: DC

## 2023-09-16 NOTE — Patient Instructions (Signed)

## 2023-09-16 NOTE — Assessment & Plan Note (Addendum)
--  Current regimen includes Apalutamide (Erleada) 240 mg daily started July 2018. And Eligard 30 mg every 4 months.   --Labs from today were reviewed and adequate for treatment.  --PSA trending. Most recent level done 05/13/2023 and was normal at 3.3.  --Continue with treatment Eligard injection as planned today. Continue with Erleada without any dose modifications.  --RTC in 4 months with labs, follow up and Eligard injection

## 2023-09-16 NOTE — Progress Notes (Signed)
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 And Eligard 30 mg every 4 months.   --Labs from today were reviewed and adequate for treatment.  --PSA trending. Most recent level done 05/13/2023 and was normal at 3.3.  --Continue with treatment Eligard injection as planned today. Continue with Erleada without any dose modifications.  --RTC in 4 months with labs, follow up and Eligard injection    Plan: Labs reviewed  -CBC showing WBC 5.4; Hgb 12.2; Hct 35.8; Plt 136; Anc 3.7 -CMP - K 4.7; glucose 118; BUN 30; Creatinine 1.07; eGFR >60; Ca 9.1; LFTs normal.   -PSA pending -renew prescription for Erleada 240 mg and send to [facility]. Take daily. No dose adjustments -proceed with Eligard injection today  -labs, follow-up, and Eligard in in 4 months.   The patient understands the plans discussed today and is in agreement with them.  He knows to contact our office if he develops concerns prior to his next appointment.  I provided 25 minutes of face-to-face time during this encounter and > 50% was spent counseling as documented under my assessment and plan.    Carlean Jews, NP  [facility] - A DEPT OF [facility] [address] [place] [address] Dept: [phone] Dept Fax: [phone]   No orders of the defined types were placed in this encounter.     CHIEF COMPLAINT:  CC: advanced prostate cancer with lymphadenopathy and bone involvement diagnosed  in 2018  Current Treatment:  -Apalutamide 240 mg daily started July 2018. -Eligard 30 mg every 4 months.    INTERVAL HISTORY:  Leyton is here today for repeat clinical assessment. Clinically, he is doing well. He denies fevers or chills. He denies pain. His appetite is good. His weight has been stable.  I have reviewed the past medical history, past surgical history, social history and family history with the patient and they are unchanged from previous note.  ALLERGIES:  is allergic to nsaids.  MEDICATIONS:  Current Outpatient Medications  Medication Sig Dispense Refill   acetaminophen (TYLENOL) 500 MG tablet Take 500 mg by mouth every 6 (six) hours as needed for mild pain or moderate pain.     apixaban (ELIQUIS) 2.5 MG TABS tablet Take 1 tablet (2.5 mg total) by mouth 2 (two) times daily. 180 tablet 1   ASPERCREME LIDOCAINE EX Apply 1 application topically daily as needed (pain).     Calcium Carb-Cholecalciferol (CALCIUM 1000 + D PO) Take 1 tablet by mouth daily.     ERLEADA 60 MG tablet Take 4 tablets (240 mg total) by mouth daily. 120 tablet 1   fish oil-omega-3 fatty acids 1000 MG capsule Take 1 g by mouth 2 (two) times daily.      fluticasone (FLONASE) 50 MCG/ACT nasal spray SPRAY 2 SPRAYS INTO EACH NOSTRIL EVERY DAY 48 mL 1   furosemide (LASIX) 20 MG tablet Take 1 tablet (20 mg total) by mouth daily. 5 tablet 0   LINZESS 145 MCG CAPS capsule Take 145 mcg by mouth daily as needed (constipation).  losartan (COZAAR) 25 MG tablet Take 1 tablet by mouth once daily 30 tablet 0   metoprolol tartrate (LOPRESSOR) 25 MG tablet Take 0.5 tablets (12.5 mg total) by mouth 2 (two) times daily. 90 tablet 3   mirtazapine (REMERON) 15 MG tablet Take 1 tablet (15 mg total) by mouth at bedtime. 30 tablet 2   niacin 500 MG tablet Take 500 mg by mouth at bedtime.     nitroGLYCERIN (NITROSTAT) 0.4 MG SL tablet Place 1 tablet (0.4 mg total) under the tongue every 5 (five) minutes as needed for chest  pain. 25 tablet 1   promethazine (PHENERGAN) 25 MG tablet TAKE 1 TABLET BY MOUTH EVERY 8 HOURS AS NEEDED FOR NAUSEA AND VOMITING 30 tablet 0   sertraline (ZOLOFT) 100 MG tablet Take 1 tablet (100 mg total) by mouth daily. 90 tablet 0   simvastatin (ZOCOR) 20 MG tablet Take 1 tablet (20 mg total) by mouth daily at 6 PM. 90 tablet 3   No current facility-administered medications for this visit.    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 (HCC)   07/22/2021 Cancer Staging   Staging form: Prostate, AJCC 8th Edition - Clinical: Stage IVB (cTX, cNX, cM1) - Signed by Benjiman Core, MD on 07/22/2021       REVIEW OF SYSTEMS:   Constitutional: Denies fevers, chills or abnormal weight loss Eyes: Denies blurriness of vision Ears, nose, mouth, throat, and face: Denies mucositis or sore throat Respiratory: Denies cough, dyspnea or wheezes Cardiovascular: Denies palpitation, chest discomfort or lower extremity swelling Gastrointestinal:  Denies nausea, heartburn or change in bowel habits Skin: Denies abnormal skin rashes Lymphatics: Denies new lymphadenopathy or easy bruising Neurological:Denies numbness, tingling or new weaknesses Behavioral/Psych: Mood is stable, no new changes  All other systems were reviewed with the patient and are negative.   VITALS:   Today's Vitals   09/16/23 1515  BP: 113/71  Pulse: 62  Resp: 17  Temp: (!) 97.3 F (36.3 C)  TempSrc: Oral  SpO2: 100%  Weight: 139 lb (63 kg)  Height: 5\' 6"  (1.676 m)   Body mass index is 22.44 kg/m.   Wt Readings from Last 3 Encounters:  09/16/23 139 lb (63 kg)  08/09/23 138 lb (62.6 kg)  06/28/23 138 lb (62.6 kg)    Body mass index is 22.44 kg/m.  Performance status (ECOG): 0 - Asymptomatic  PHYSICAL EXAM:   GENERAL:alert,  no distress and comfortable SKIN: skin color, texture, turgor are normal, no rashes or significant lesions EYES: normal, Conjunctiva are pink and non-injected, sclera clear OROPHARYNX:no exudate, no erythema and lips, buccal mucosa, and tongue normal  NECK: supple, thyroid normal size, non-tender, without nodularity LYMPH:  no palpable lymphadenopathy in the cervical, axillary or inguinal LUNGS: clear to auscultation and percussion with normal breathing effort HEART: regular rate & rhythm and no murmurs and no lower extremity edema ABDOMEN:abdomen soft, non-tender and normal bowel sounds Musculoskeletal:no cyanosis of digits and no clubbing  NEURO: alert & oriented x 3 with fluent speech, no focal motor/sensory deficits  LABORATORY DATA:  I have reviewed the data as listed    Component Value Date/Time   NA 139 09/16/2023 1500   NA 143 07/08/2022 1036   K 4.7 09/16/2023 1500   CL 106 09/16/2023 1500   CO2 28 09/16/2023 1500   GLUCOSE 118 (H) 09/16/2023 1500  BUN 30 (H) 09/16/2023 1500   BUN 14 07/08/2022 1036   CREATININE 1.07 09/16/2023 1500   CALCIUM 9.1 09/16/2023 1500   PROT 6.4 (L) 09/16/2023 1500   ALBUMIN 4.1 09/16/2023 1500   AST 16 09/16/2023 1500   ALT 11 09/16/2023 1500   ALKPHOS 78 09/16/2023 1500   BILITOT 0.4 09/16/2023 1500   GFRNONAA >60 09/16/2023 1500   GFRAA >60 05/13/2020 1214     Lab Results  Component Value Date   WBC 5.4 09/16/2023   NEUTROABS 3.7 09/16/2023   HGB 12.2 (L) 09/16/2023   HCT 35.8 (L) 09/16/2023   MCV 93.0 09/16/2023   PLT 136 (L) 09/16/2023

## 2023-09-17 LAB — PROSTATE-SPECIFIC AG, SERUM (LABCORP): Prostate Specific Ag, Serum: 2.7 ng/mL (ref 0.0–4.0)

## 2023-09-20 ENCOUNTER — Encounter: Payer: Self-pay | Admitting: Family Medicine

## 2023-09-20 ENCOUNTER — Ambulatory Visit (INDEPENDENT_AMBULATORY_CARE_PROVIDER_SITE_OTHER): Payer: Medicare Other | Admitting: Family Medicine

## 2023-09-20 VITALS — BP 110/67 | HR 83 | Resp 18 | Ht 66.0 in | Wt 139.0 lb

## 2023-09-20 DIAGNOSIS — F5101 Primary insomnia: Secondary | ICD-10-CM | POA: Diagnosis not present

## 2023-09-20 DIAGNOSIS — Z23 Encounter for immunization: Secondary | ICD-10-CM | POA: Diagnosis not present

## 2023-09-20 NOTE — Patient Instructions (Addendum)
SLEEP: -Take mirtazepine 15 mg daily at bedtime.

## 2023-09-20 NOTE — Progress Notes (Signed)
Established Patient Office Visit  Subjective   Patient ID: Randy BREHENY Sr., male    DOB: 07/01/1930  Age: 89 y.o. MRN: [id number]  Chief Complaint  Patient presents with   Insomnia    HPI Randy WALWORTH Sr. is a 89 y.o. male presenting today for follow up of insomnia.  At last appointment, discussed starting with suvorexant but ran into limitations with insurance coverage and prices.  He was not able to continue with suvorexant but did well with a trial of mirtazapine 15 mg tablets which he borrowed from his wife.  He has not been taking any since he got his own prescription because his bottle did not specify that the medication was for sleep and he wanted to verify this before taking it.  Outpatient Medications Prior to Visit  Medication Sig   acetaminophen (TYLENOL) 500 MG tablet Take 500 mg by mouth every 6 (six) hours as needed for mild pain or moderate pain.   apixaban (ELIQUIS) 2.5 MG TABS tablet Take 1 tablet (2.5 mg total) by mouth 2 (two) times daily.   ERLEADA 60 MG tablet Take 4 tablets (240 mg total) by mouth daily.   fish oil-omega-3 fatty acids 1000 MG capsule Take 1 g by mouth 2 (two) times daily.    LINZESS 145 MCG CAPS capsule Take 145 mcg by mouth daily as needed (constipation).   losartan (COZAAR) 25 MG tablet Take 1 tablet by mouth once daily   metoprolol tartrate (LOPRESSOR) 25 MG tablet Take 0.5 tablets (12.5 mg total) by mouth 2 (two) times daily.   mirtazapine (REMERON) 15 MG tablet Take 1 tablet (15 mg total) by mouth at bedtime.   niacin 500 MG tablet Take 500 mg by mouth at bedtime.   nitroGLYCERIN (NITROSTAT) 0.4 MG SL tablet Place 1 tablet (0.4 mg total) under the tongue every 5 (five) minutes as needed for chest pain.   promethazine (PHENERGAN) 25 MG tablet TAKE 1 TABLET BY MOUTH EVERY 8 HOURS AS NEEDED FOR NAUSEA AND VOMITING   sertraline (ZOLOFT) 100 MG tablet Take 1 tablet (100 mg total) by mouth daily.   simvastatin (ZOCOR) 20 MG tablet Take 1  tablet (20 mg total) by mouth daily at 6 PM.   [DISCONTINUED] ASPERCREME LIDOCAINE EX Apply 1 application topically daily as needed (pain).   [DISCONTINUED] Calcium Carb-Cholecalciferol (CALCIUM 1000 + D PO) Take 1 tablet by mouth daily.   [DISCONTINUED] fluticasone (FLONASE) 50 MCG/ACT nasal spray SPRAY 2 SPRAYS INTO EACH NOSTRIL EVERY DAY   [DISCONTINUED] furosemide (LASIX) 20 MG tablet Take 1 tablet (20 mg total) by mouth daily.   No facility-administered medications prior to visit.    ROS Negative unless otherwise noted in HPI   Objective:     BP 110/67 (BP Location: Left Arm, Patient Position: Sitting, Cuff Size: Normal)   Pulse 83   Resp 18   Ht 5\' 6"  (1.676 m)   Wt 139 lb (63 kg)   SpO2 100%   BMI 22.44 kg/m   Physical Exam Vitals reviewed.  Constitutional:      General: He is not in acute distress.    Appearance: Normal appearance. He is not ill-appearing.  HENT:     Head: Normocephalic and atraumatic.     Nose: Nose normal.  Eyes:     Conjunctiva/sclera: Conjunctivae normal.  Pulmonary:     Effort: Pulmonary effort is normal.  Musculoskeletal:     Cervical back: Normal range of motion.  Skin:  Coloration: Skin is not jaundiced or pale.     Findings: No bruising.  Neurological:     Mental Status: He is alert and oriented to person, place, and time.  Psychiatric:        Mood and Affect: Mood normal.     Assessment & Plan:  Primary insomnia Assessment & Plan: Reassured that mirtazapine is intended to be used for sleep.  Continue mirtazapine 15 mg nightly.  Will continue to monitor.   Need for influenza vaccination -     Flu Vaccine Trivalent High Dose (Fluad)    Return in about 3 months (around 12/21/2023) for follow-up for mood, sleep, HTN.    Melida Quitter, PA

## 2023-09-20 NOTE — Assessment & Plan Note (Addendum)
Reassured that mirtazapine is intended to be used for sleep.  Continue mirtazapine 15 mg nightly.  Will continue to monitor.

## 2023-09-23 ENCOUNTER — Telehealth: Payer: Self-pay | Admitting: Pharmacist

## 2023-09-23 ENCOUNTER — Other Ambulatory Visit: Payer: Self-pay | Admitting: Pharmacist

## 2023-09-23 NOTE — Progress Notes (Addendum)
   09/23/2023  Patient ID: Randy Skill Sr., male   DOB: April 22, 1930, 89 y.o.   MRN: [id number]  Tried calling the patient twice for visit at 11AM today. Home phone number listed appears out-of-order and left HIPAA compliant voicemail on other number listed requesting call back at earliest convenience.  Meeting for Suvorexant PAP through [employer].    Marlowe Aschoff, PharmD [facility] Phone Number: [phone]

## 2023-09-29 ENCOUNTER — Other Ambulatory Visit: Payer: Self-pay | Admitting: Cardiology

## 2023-10-11 ENCOUNTER — Other Ambulatory Visit: Payer: Self-pay | Admitting: Family Medicine

## 2023-10-11 ENCOUNTER — Other Ambulatory Visit: Payer: Self-pay | Admitting: Pharmacist

## 2023-10-11 ENCOUNTER — Telehealth: Payer: Self-pay | Admitting: Pharmacist

## 2023-10-11 NOTE — Progress Notes (Signed)
   10/11/2023  Patient ID: Randy Skill Sr., male   DOB: [birth year], 89 y.o.   MRN: [id number]  Called and spoke with Mrs. Pegan on the phone today briefly. Reports her husband was still asleep and asked about re-scheduling. Quickly reviewed criteria for [employer] application. Patient should qualify based on income. Advised to expect application in the mail that he needs to sign and then bring into PCP's office for completion once received. She is aware and can do that.   Marlowe Aschoff, PharmD [facility] Phone Number: [phone]

## 2023-10-18 ENCOUNTER — Other Ambulatory Visit: Payer: Self-pay | Admitting: Nurse Practitioner

## 2023-10-18 DIAGNOSIS — I209 Angina pectoris, unspecified: Secondary | ICD-10-CM

## 2023-10-18 NOTE — Telephone Encounter (Signed)
Randy [name] has you for PCP.

## 2023-10-20 ENCOUNTER — Other Ambulatory Visit: Payer: Self-pay | Admitting: Nurse Practitioner

## 2023-10-20 ENCOUNTER — Other Ambulatory Visit: Payer: Self-pay | Admitting: Cardiology

## 2023-10-20 DIAGNOSIS — I209 Angina pectoris, unspecified: Secondary | ICD-10-CM

## 2023-10-26 ENCOUNTER — Other Ambulatory Visit: Payer: Self-pay | Admitting: Cardiology

## 2023-10-26 NOTE — Telephone Encounter (Unsigned)
Copied from CRM [id number]. Topic: Clinical - Prescription Issue >> Oct 26, 2023  4:20 PM Carlatta H wrote: Reason for CRM: apixaban (ELIQUIS) 2.5 MG TABS prescription is to high in price//Patients wife would like to know if the office may have some samples for them until January where the medication will be cheaper for them to afford//

## 2023-10-27 ENCOUNTER — Ambulatory Visit: Payer: Self-pay | Admitting: Family Medicine

## 2023-10-27 ENCOUNTER — Other Ambulatory Visit: Payer: Self-pay | Admitting: Nurse Practitioner

## 2023-10-27 ENCOUNTER — Telehealth: Payer: Self-pay | Admitting: Cardiology

## 2023-10-27 ENCOUNTER — Other Ambulatory Visit: Payer: Self-pay | Admitting: *Deleted

## 2023-10-27 ENCOUNTER — Telehealth: Payer: Self-pay | Admitting: *Deleted

## 2023-10-27 ENCOUNTER — Ambulatory Visit (INDEPENDENT_AMBULATORY_CARE_PROVIDER_SITE_OTHER): Payer: Medicare Other | Admitting: Family Medicine

## 2023-10-27 ENCOUNTER — Encounter: Payer: Self-pay | Admitting: Family Medicine

## 2023-10-27 VITALS — BP 115/69 | HR 84 | Resp 18 | Ht 66.0 in | Wt 147.0 lb

## 2023-10-27 DIAGNOSIS — I4891 Unspecified atrial fibrillation: Secondary | ICD-10-CM

## 2023-10-27 DIAGNOSIS — R21 Rash and other nonspecific skin eruption: Secondary | ICD-10-CM | POA: Diagnosis not present

## 2023-10-27 DIAGNOSIS — C61 Malignant neoplasm of prostate: Secondary | ICD-10-CM

## 2023-10-27 MED ORDER — TRIAMCINOLONE ACETONIDE 0.1 % EX OINT: 1.0000 | TOPICAL_OINTMENT | Freq: Two times a day (BID) | CUTANEOUS | 6 refills | Status: AC

## 2023-10-27 MED ORDER — ERLEADA 60 MG PO TABS: 240.0000 mg | ORAL_TABLET | Freq: Every day | ORAL | 1 refills | Status: DC

## 2023-10-27 NOTE — Telephone Encounter (Signed)
Copied from CRM [id number]. Topic: Clinical - Pink Word Triage >> Oct 27, 2023  9:34 AM Lovey Newcomer R wrote: Reason for Triage: Patient not sure if he's having an allergic reaction to something. Has small and cantaloupe sized whelps on both legs. Redness, burning, and hardness. Been there for about 2 days. Feels like they may be drying up. Left leg swollen   Chief Complaint: Rash on both legs Symptoms: Redness, scabs, welts Frequency: Ongoing for a week Pertinent Negatives: Patient denies itching or pain, no breathing difficulty Disposition: [] ED /[] Urgent Care (no appt availability in office) / [x] Appointment(In office/virtual)/ []  [facility]/ [] Home Care/ [] Refused Recommended Disposition /[] [facility]/ []  Follow-up with PCP  Additional Notes:  Both the patient and his wife were present during the phone call. Patient gave this RN permission to speak to wife. Patient has been experiencing a rash on both legs for a week. He has red spots and welts all over his legs. Some of the spots are grapefruit sized.  He is not sure what could be causing it. His wife tried applying her facial cream on his legs, but the spots haven't went away. He denies any pain or itching. Same day appointment has been scheduled. Patient instructed to contact the office of any worsening symptoms in the meantime.  Reason for Disposition  Mild widespread rash  (Exception: Heat rash lasting 3 days or less.)  Answer Assessment - Initial Assessment Questions 1. APPEARANCE of RASH: "Describe the rash." (e.g., spots, blisters, raised areas, skin peeling, scaly)     Redness all over both legs, some raised areas, scabs, welts  2. SIZE: "How big are the spots?" (e.g., tip of pen, eraser, coin; inches, centimeters)     Some are grapefruit sized, some are smaller  3. LOCATION: "Where is the rash located?"     Bilateral legs  4. COLOR: "What color is the rash?" (Note: It is difficult to assess rash color in  people with darker-colored skin. When this situation occurs, simply ask the caller to describe what they see.)     Redness  5. ONSET: "When did the rash begin?"     One week ago  6. FEVER: "Do you have a fever?" If Yes, ask: "What is your temperature, how was it measured, and when did it start?"     No  7. ITCHING: "Does the rash itch?" If Yes, ask: "How bad is the itch?" (Scale 1-10; or mild, moderate, severe)     No  8. CAUSE: "What do you think is causing the rash?"     Unknown  9. MEDICINE FACTORS: "Have you started any new medicines within the last 2 weeks?" (e.g., antibiotics)      No  10. OTHER SYMPTOMS: "Do you have any other symptoms?" (e.g., dizziness, headache, sore throat, joint pain)       No  Protocols used: Rash or Redness - [facility]

## 2023-10-27 NOTE — Telephone Encounter (Signed)
Pt c/o medication issue:  1. Name of Medication: apixaban (ELIQUIS) 2.5 MG TABS tablet   2. How are you currently taking this medication (dosage and times per day)?    3. Are you having a reaction (difficulty breathing--STAT)? no  4. What is your medication issue? Wife is calling to see if our office have any samples. Please advise

## 2023-10-27 NOTE — Telephone Encounter (Signed)
Pt was scheduled for today by triage      Copied from CRM [id number]. Topic: Appointments - Appointment Scheduling >> Oct 27, 2023  9:31 AM Desma Mcgregor wrote: Patient/patient representative is calling to schedule an appointment. No appts available for today. Pt has huge welps the size of cantalopes. Please contact patient's wife back at [phone]
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
## 2023-10-31 MED ORDER — APIXABAN 2.5 MG PO TABS: 2.5000 mg | ORAL_TABLET | Freq: Two times a day (BID) | ORAL | Status: DC

## 2023-10-31 NOTE — Telephone Encounter (Signed)
Leaving pt 2 weeks of Eliquis 2.5 mg tablets samples at [facility] front desk for pt to pick up.

## 2023-10-31 NOTE — Telephone Encounter (Signed)
Attempted to call numbers in patient chart, unable to leave voicemail or connect call.

## 2023-10-31 NOTE — Telephone Encounter (Signed)
Yes will do have samples.

## 2023-10-31 NOTE — Telephone Encounter (Signed)
Can we leave him 2 weeks, just to get to the new year?

## 2023-11-01 NOTE — Telephone Encounter (Signed)
2nd attempt to call patient, no answer, unable to leave voicemail.

## 2023-11-03 NOTE — Telephone Encounter (Signed)
3rd attempt to call patient, no answer, unable to leave message Nursing will await for patient to return call

## 2023-11-09 ENCOUNTER — Other Ambulatory Visit: Payer: Self-pay | Admitting: Nurse Practitioner

## 2023-11-10 NOTE — Telephone Encounter (Signed)
 This Pt of Dr. Jenene Slicker was last seen on 07/08/22 by Robin Searing PA. This Pt is passed his 3rd attempt. Does Dr. Antoine Poche want to refill? Please advise.

## 2023-11-11 MED ORDER — METOPROLOL TARTRATE 25 MG PO TABS: 12.5000 mg | ORAL_TABLET | Freq: Two times a day (BID) | ORAL | 0 refills | Status: DC

## 2023-11-11 NOTE — Telephone Encounter (Signed)
 Spoke with pt and wife. Appt scheduled refill sent

## 2023-11-15 ENCOUNTER — Other Ambulatory Visit: Payer: Self-pay | Admitting: *Deleted

## 2023-11-15 ENCOUNTER — Telehealth: Payer: Self-pay | Admitting: Pharmacy Technician

## 2023-11-15 NOTE — Telephone Encounter (Signed)
 Oral Oncology Patient Advocate Encounter  Called and spoke with [employer] regarding patient's re-enrollment in PAP for his Erleada .   Per the representative. Tanya, patient has been approved to continue receiving Erleada  from [employer] at no charge for 2025.  Effective dates 11/09/23-11/07/24  Estefana Moellers, CPhT-Adv Oncology Pharmacy Patient Advocate [facility] Direct Number: [phone]  Fax: [phone]

## 2023-11-16 ENCOUNTER — Encounter: Payer: Self-pay | Admitting: Hematology and Oncology

## 2023-11-16 ENCOUNTER — Telehealth: Payer: Self-pay | Admitting: *Deleted

## 2023-11-16 NOTE — Telephone Encounter (Signed)
 Received msg from Estefana HERO in [facility] - patient contacted her stating refill Erleada  needed. Chart reviewed. Rx for Erleada  sent from [facility] provider H.Boscia, NP on 10/27/23 for 30 day supply with 1 refill. Contacted [facility] - spoke with Paige. They confirmed that patient has 10/27/23 rx on file. They will fill today and text patient shipping info.

## 2023-11-21 ENCOUNTER — Other Ambulatory Visit: Payer: Self-pay | Admitting: Family Medicine

## 2023-12-08 ENCOUNTER — Other Ambulatory Visit: Payer: Self-pay | Admitting: Cardiology

## 2023-12-13 ENCOUNTER — Other Ambulatory Visit: Payer: Self-pay | Admitting: Family Medicine

## 2023-12-13 DIAGNOSIS — F325 Major depressive disorder, single episode, in full remission: Secondary | ICD-10-CM

## 2023-12-15 ENCOUNTER — Encounter: Payer: Self-pay | Admitting: Family Medicine

## 2023-12-16 ENCOUNTER — Other Ambulatory Visit: Payer: Self-pay | Admitting: Family Medicine

## 2023-12-16 DIAGNOSIS — F5101 Primary insomnia: Secondary | ICD-10-CM

## 2023-12-17 ENCOUNTER — Encounter: Payer: Self-pay | Admitting: Family Medicine

## 2023-12-21 ENCOUNTER — Ambulatory Visit: Payer: Medicare Other | Admitting: Family Medicine

## 2023-12-23 ENCOUNTER — Other Ambulatory Visit: Payer: Self-pay | Admitting: Family Medicine

## 2023-12-23 NOTE — Telephone Encounter (Unsigned)
Copied from CRM [id number]. Topic: Clinical - Medication Refill >> Dec 23, 2023 10:49 AM Martha Clan wrote: Most Recent Primary Care Visit:  Provider: Saralyn Pilar A  Department: [facility]  Visit Type: ACUTE  Date: 10/27/2023  Medication: sertraline (ZOLOFT) 100 MG tablet [[id number]] mirtazapine (REMERON) 15 MG tablet [[id number]]  Has the patient contacted their pharmacy? Yes (Agent: If no, request that the patient contact the pharmacy for the refill. If patient does not wish to contact the pharmacy document the reason why and proceed with request.) (Agent: If yes, when and what did the pharmacy advise?)  Is this the correct pharmacy for this prescription? Yes If no, delete pharmacy and type the correct one.  This is the patient's preferred pharmacy:  [facility] [address] [place], [place] - [address] [place] [address] Phone: [phone] Fax: [phone]   Has the prescription been filled recently? No  Is the patient out of the medication? Yes  Has the patient been seen for an appointment in the last year OR does the patient have an upcoming appointment? Yes  Can we respond through MyChart? No  Agent: Please be advised that Rx refills may take up to 3 business days. We ask that you follow-up with your pharmacy.

## 2024-01-02 ENCOUNTER — Other Ambulatory Visit: Payer: Self-pay | Admitting: *Deleted

## 2024-01-02 DIAGNOSIS — R9721 Rising PSA following treatment for malignant neoplasm of prostate: Secondary | ICD-10-CM

## 2024-01-02 MED ORDER — ERLEADA 60 MG PO TABS
240.0000 mg | ORAL_TABLET | Freq: Every day | ORAL | 1 refills | Status: DC
Start: 2024-01-02 — End: 2024-02-16

## 2024-01-17 ENCOUNTER — Other Ambulatory Visit: Payer: Self-pay | Admitting: Cardiology

## 2024-01-18 DIAGNOSIS — I5022 Chronic systolic (congestive) heart failure: Secondary | ICD-10-CM | POA: Insufficient documentation

## 2024-01-18 NOTE — Progress Notes (Deleted)
 Cardiology Office Note:   Date:  01/18/2024  ID:  Randy Skill Sr., DOB [birth year], MRN [id number] PCP: Melida Quitter, PA  [facility] Providers Cardiologist:  Rollene Rotunda, MD {  History of Present Illness:   Randy KEATTS Sr. is a 89 y.o. male who presents today for follow-up of AF with RVR.  Randy [name] was admitted 06/21/2022 with complaint of sternal chest pain with dizziness.  He was given sublingual nitroglycerin and EMS was called and patient was found to have AF with rapid ventricular rate.  Repeat EKG in ED revealed ST elevation in inferior leads with ST depression he was started on Cardizem drip and IV heparin.  2D echo was completed with EF of 35-40%, moderately decreased LV function, RWMA, grade 2 DD RV function, moderate to severe MV regurgitation and moderate-severe TR regurgitation.  Left heart catheter completed and revealed severe multivessel calcifications with diffuse calcification of the proximal to mid LAD with 50% proximal stenosis and 60% mid stenosis; subtotal ostial stenosis of a small ramus intermediate vessel; 50% ostial circumflex stenosis with 95% calcified OM1 stenosis; and total occlusion of the mid RCA.  Medical therapy was recommended.  ***    Randy [name] presents for follow-up today with his son. Since last being seen he reports that he has been feeling weak.  Today his blood pressure was 98/56.  His son reports that his blood pressures have been on the low side since coming home from the hospital.  He also reports that Randy [name] is not drinking enough water daily while taking his Lasix.  In reviewing his weights from the last few days he has remained steady and is euvolemic on examination. Patient denies chest pain, palpitations, dyspnea, PND, orthopnea, nausea, vomiting, dizziness, syncope, edema, weight gain, or early satiety.  ROS: ***  Studies Reviewed:    EKG:       ***  Risk Assessment/Calculations:   {Does this patient have  ATRIAL FIBRILLATION?:[phone]} No BP recorded.  {Refresh Note OR Click here to enter BP  :1}***        Physical Exam:   VS:  There were no vitals taken for this visit.   Wt Readings from Last 3 Encounters:  10/27/23 147 lb (66.7 kg)  09/20/23 139 lb (63 kg)  09/16/23 139 lb (63 kg)     GEN: Well nourished, well developed in no acute distress NECK: No JVD; No carotid bruits CARDIAC: ***RR, *** murmurs, rubs, gallops RESPIRATORY:  Clear to auscultation without rales, wheezing or rhonchi  ABDOMEN: Soft, non-tender, non-distended EXTREMITIES:  No edema; No deformity   ASSESSMENT AND PLAN:   NSTEMI:  ***   -Underwent LHC 8/15 that showed severe multivessel coronary calcification with total occlusion of mid RCA with collateral flow recommended medical management Current GDMT with Zocor 20 mg daily, metoprolol 12.5 mg twice daily, currently not on ASA due to Eliquis and new onset AF -Patient reports no angina or equivalent since his discharge   AF with RVR: ***   -New onset currently rate controlled with rate of 80 -Patient denies blood in his urine or stool -Continue Eliquis 2.5 mg twice daily -Continue metoprolol 12.5 mg twice daily   Ischemic cardiomyopathy/chronic HFrEF: ***  -echo with reduced LVEF at 40%, diastolic dysfunction, pulmonary artery hypertension, severe  mitral valve regurgitation, moderate  to severe tricuspid regurgitation, moderate aortic valve regurgitation -Patient is euvolemic on examination and is experiencing hypotension due to hypovolemia. -We will have him hold his Lasix  for a few days to see if his blood pressures increase -Asked him to pay close attention to his weights over the next few days    Hypertension:  ***   -Blood pressure today was 98/56 -He was advised to increase his fluid intake and check his blood pressures over the next 2 weeks -If blood pressures remain low we will make adjustments to his other antihypertensive medications    ***    Mitral valve regurgitation:  ***  TR:  I will follow this clinically.  ***  , moderate  to severe tricuspid regurgitation, moderate aortic valve regurgitation -Continue blood pressure control and fluid volume management Low sodium diet, fluid restriction <2L, and daily weights encouraged. Educated to contact our office for weight gain of 2 lbs overnight or 5 lbs in one week.     Follow up ***  Signed, Rollene Rotunda, MD

## 2024-01-19 ENCOUNTER — Ambulatory Visit: Payer: Medicare Other | Attending: Cardiology | Admitting: Cardiology

## 2024-01-19 DIAGNOSIS — I214 Non-ST elevation (NSTEMI) myocardial infarction: Secondary | ICD-10-CM

## 2024-01-19 DIAGNOSIS — I10 Essential (primary) hypertension: Secondary | ICD-10-CM

## 2024-01-19 DIAGNOSIS — I361 Nonrheumatic tricuspid (valve) insufficiency: Secondary | ICD-10-CM

## 2024-01-19 DIAGNOSIS — I5022 Chronic systolic (congestive) heart failure: Secondary | ICD-10-CM

## 2024-01-19 DIAGNOSIS — I4891 Unspecified atrial fibrillation: Secondary | ICD-10-CM

## 2024-01-20 ENCOUNTER — Inpatient Hospital Stay (HOSPITAL_BASED_OUTPATIENT_CLINIC_OR_DEPARTMENT_OTHER): Payer: Medicare Other | Admitting: Hematology and Oncology

## 2024-01-20 ENCOUNTER — Inpatient Hospital Stay: Payer: Medicare Other

## 2024-01-20 ENCOUNTER — Other Ambulatory Visit: Payer: Self-pay | Admitting: Hematology and Oncology

## 2024-01-20 ENCOUNTER — Inpatient Hospital Stay: Payer: Medicare Other | Attending: Hematology and Oncology

## 2024-01-20 VITALS — BP 136/69 | HR 64 | Temp 97.9°F | Resp 13 | Wt 144.9 lb

## 2024-01-20 DIAGNOSIS — K219 Gastro-esophageal reflux disease without esophagitis: Secondary | ICD-10-CM | POA: Diagnosis not present

## 2024-01-20 DIAGNOSIS — E785 Hyperlipidemia, unspecified: Secondary | ICD-10-CM | POA: Diagnosis not present

## 2024-01-20 DIAGNOSIS — Z7901 Long term (current) use of anticoagulants: Secondary | ICD-10-CM | POA: Insufficient documentation

## 2024-01-20 DIAGNOSIS — Z803 Family history of malignant neoplasm of breast: Secondary | ICD-10-CM | POA: Diagnosis not present

## 2024-01-20 DIAGNOSIS — I252 Old myocardial infarction: Secondary | ICD-10-CM | POA: Diagnosis not present

## 2024-01-20 DIAGNOSIS — Z79899 Other long term (current) drug therapy: Secondary | ICD-10-CM | POA: Insufficient documentation

## 2024-01-20 DIAGNOSIS — I10 Essential (primary) hypertension: Secondary | ICD-10-CM | POA: Insufficient documentation

## 2024-01-20 DIAGNOSIS — C61 Malignant neoplasm of prostate: Secondary | ICD-10-CM | POA: Diagnosis not present

## 2024-01-20 DIAGNOSIS — Z79818 Long term (current) use of other agents affecting estrogen receptors and estrogen levels: Secondary | ICD-10-CM | POA: Diagnosis not present

## 2024-01-20 DIAGNOSIS — Z8546 Personal history of malignant neoplasm of prostate: Secondary | ICD-10-CM

## 2024-01-20 DIAGNOSIS — I2510 Atherosclerotic heart disease of native coronary artery without angina pectoris: Secondary | ICD-10-CM | POA: Diagnosis not present

## 2024-01-20 DIAGNOSIS — Z192 Hormone resistant malignancy status: Secondary | ICD-10-CM | POA: Insufficient documentation

## 2024-01-20 DIAGNOSIS — R9721 Rising PSA following treatment for malignant neoplasm of prostate: Secondary | ICD-10-CM

## 2024-01-20 LAB — CMP (CANCER CENTER ONLY)
ALT: 10 U/L (ref 0–44)
AST: 17 U/L (ref 15–41)
Albumin: 4.1 g/dL (ref 3.5–5.0)
Alkaline Phosphatase: 69 U/L (ref 38–126)
Anion gap: 6 (ref 5–15)
BUN: 26 mg/dL — ABNORMAL HIGH (ref 8–23)
CO2: 28 mmol/L (ref 22–32)
Calcium: 9 mg/dL (ref 8.9–10.3)
Chloride: 106 mmol/L (ref 98–111)
Creatinine: 0.93 mg/dL (ref 0.61–1.24)
GFR, Estimated: >60 mL/min (ref >60)
Glucose, Bld: 121 mg/dL — ABNORMAL HIGH (ref 70–99)
Potassium: 4.4 mmol/L (ref 3.5–5.1)
Sodium: 140 mmol/L (ref 135–145)
Total Bilirubin: 0.4 mg/dL (ref 0.0–1.2)
Total Protein: 6.5 g/dL (ref 6.5–8.1)

## 2024-01-20 LAB — CBC WITH DIFFERENTIAL (CANCER CENTER ONLY)
Abs Immature Granulocytes: 0.03 10*3/uL (ref 0.00–0.07)
Basophils Absolute: 0 10*3/uL (ref 0.0–0.1)
Basophils Relative: 1 %
Eosinophils Absolute: 0.5 10*3/uL (ref 0.0–0.5)
Eosinophils Relative: 10 %
HCT: 37.3 % — ABNORMAL LOW (ref 39.0–52.0)
Hemoglobin: 12.5 g/dL — ABNORMAL LOW (ref 13.0–17.0)
Immature Granulocytes: 1 %
Lymphocytes Relative: 15 %
Lymphs Abs: 0.7 10*3/uL (ref 0.7–4.0)
MCH: 31 pg (ref 26.0–34.0)
MCHC: 33.5 g/dL (ref 30.0–36.0)
MCV: 92.6 fL (ref 80.0–100.0)
Monocytes Absolute: 0.4 10*3/uL (ref 0.1–1.0)
Monocytes Relative: 9 %
Neutro Abs: 3.1 10*3/uL (ref 1.7–7.7)
Neutrophils Relative %: 64 %
Platelet Count: 157 10*3/uL (ref 150–400)
RBC: 4.03 MIL/uL — ABNORMAL LOW (ref 4.22–5.81)
RDW: 13.1 % (ref 11.5–15.5)
WBC Count: 4.8 10*3/uL (ref 4.0–10.5)
nRBC: 0 % (ref 0.0–0.2)

## 2024-01-20 MED ORDER — LEUPROLIDE ACETATE (4 MONTH) 30 MG ~~LOC~~ KIT
30.0000 mg | PACK | Freq: Once | SUBCUTANEOUS | Status: AC
  Administered 2024-01-20: 30 mg via SUBCUTANEOUS
  Filled 2024-01-20: qty 30

## 2024-01-20 NOTE — Progress Notes (Signed)
 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 -Eligard 30 mg every 4 months.    Interval History:  Randy [name] Sr. 89 y.o. male with medical history significant for castration resistant adenocarcinoma of Randy prostate who presents for a follow up visit. Randy [name]'s last visit was on 09/16/2023 with Rodman Key. In Randy interim since Randy last visit he has had no major changes in his health.  On exam today Randy [name] reports that he is tolerating his Eligard and apalutamide well.  He reports he is taking Randy tablets daily as prescribed.  He reports that Randy medication comes at no cost to him.  It does not cause any hot flashes or sweats.  He is not having any bone or back pain.  He reports he is tolerating Eliquis 2.5 mg well with no bleeding, bruising, or dark stools.  He reports that he does occasionally have some leaking of urine but for Randy most part does not have any major urinary symptoms.  He is had no frank urinary accidents.  He reports that he is able to work without difficulty and is "clearing out Randy shop".  He notes that he also has an extensive historical coin collection.  Otherwise he denies any fevers, chills, sweats, nausea vomiting or diarrhea.  Full 10 point ROS is otherwise negative.  MEDICAL HISTORY:  Past Medical History:  Diagnosis Date   Acute MI Roselina Burgueno [facility])    Arthritis    CAD (coronary artery disease)    Coronary artery disease    GERD (gastroesophageal reflux disease)    HLD (hyperlipidemia)    HTN  (hypertension)    Hyperlipidemia    Hypertension    NSTEMI (non-ST elevated myocardial infarction) (HCC) 06/23/2022   Prostate cancer (HCC)    Wears glasses     SURGICAL HISTORY: Past Surgical History:  Procedure Laterality Date   CORONARY ANGIOPLASTY     11/30/94 (Dr. Verdis Prime): Mild ant/anteroapical hypokinesis (known ant MI '94), EF 60%. 50-60%pLAD, 60% RI, 50% oRCA, 90% OM1 (s/p PTCA '96)   KNEE ARTHROSCOPY Right 03/01/2018   Procedure: ARTHROSCOPY KNEE;  Surgeon: Gean Birchwood, MD;  Location: [facility] OR;  Service: Orthopedics;  Laterality: Right;  debridement of medial and lateral meniscal tears   LEFT HEART CATH AND CORONARY ANGIOGRAPHY N/A 06/22/2022   Procedure: LEFT HEART CATH AND CORONARY ANGIOGRAPHY;  Surgeon: Lennette Bihari, MD;  Location: [facility] INVASIVE CV LAB;  Service: Cardiovascular;  Laterality: N/A;   Lumbar back surgery      SOCIAL HISTORY: Social History   Socioeconomic History   Marital status: Married    Spouse name: Para March   Number of children: 2   Years of education: Not on file   Highest education level: Not on file  Occupational History    Comment: retired  Tobacco Use   Smoking status: Never    Passive exposure: Never   Smokeless tobacco: Never  Vaping Use   Vaping status: Never Used  Substance and Sexual Activity   Alcohol use: Not Currently   Drug use: Never   Sexual activity: Not Currently  Other Topics Concern  Not on file  Social History Narrative   ** Merged History Encounter **       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     Social Drivers of Corporate investment banker Strain: Low Risk  (06/28/2023)   Overall Financial Resource Strain (CARDIA)    Difficulty of Paying Living Expenses: Not hard at all  Food Insecurity: No Food Insecurity (06/28/2023)   Hunger Vital Sign    Worried About Running Out of Food in Randy Last Year: Never true    Ran Out of Food in Randy Last Year: Never true  Transportation Needs: No Transportation Needs (06/28/2023)    PRAPARE - Administrator, Civil Service (Medical): No    Lack of Transportation (Non-Medical): No  Physical Activity: Sufficiently Active (06/28/2023)   Exercise Vital Sign    Days of Exercise per Week: 7 days    Minutes of Exercise per Session: 30 min  Stress: No Stress Concern Present (06/28/2023)   [employer] of Occupational Health - Occupational Stress Questionnaire    Feeling of Stress : Not at all  Social Connections: Socially Integrated (06/28/2023)   Social Connection and Isolation Panel [NHANES]    Frequency of Communication with Friends and Family: More than three times a week    Frequency of Social Gatherings with Friends and Family: More than three times a week    Attends Religious Services: More than 4 times per year    Active Member of [employer] or Organizations: Yes    Attends Engineer, structural: More than 4 times per year    Marital Status: Married  Catering manager Violence: Not At Risk (06/28/2023)   Humiliation, Afraid, Rape, and Kick questionnaire    Fear of Current or Ex-Partner: No    Emotionally Abused: No    Physically Abused: No    Sexually Abused: No    FAMILY HISTORY: Family History  Problem Relation Age of Onset   Alzheimer's disease Mother    Bone cancer Father    Breast cancer Neg Hx    Colon cancer Neg Hx    Prostate cancer Neg Hx    Pancreatic cancer Neg Hx     ALLERGIES:  is allergic to nsaids.  MEDICATIONS:  Current Outpatient Medications  Medication Sig Dispense Refill   acetaminophen (TYLENOL) 500 MG tablet Take 500 mg by mouth every 6 (six) hours as needed for mild pain or moderate pain.     apixaban (ELIQUIS) 2.5 MG TABS tablet Take 1 tablet (2.5 mg total) by mouth 2 (two) times daily. 28 tablet    ERLEADA 60 MG tablet Take 4 tablets (240 mg total) by mouth daily. 120 tablet 1   fish oil-omega-3 fatty acids 1000 MG capsule Take 1 g by mouth 2 (two) times daily.      LINZESS 145 MCG CAPS capsule Take 145 mcg by  mouth daily as needed (constipation).     losartan (COZAAR) 25 MG tablet TAKE 1 TABLET BY MOUTH ONCE DAILY . APPOINTMENT REQUIRED FOR FUTURE REFILLS [phone] 30 tablet 2   metoprolol tartrate (LOPRESSOR) 25 MG tablet TAKE 1/2 (ONE-HALF) TABLET BY MOUTH TWICE DAILY . APPOINTMENT REQUIRED FOR FUTURE REFILLS FINAL  ATTEMPT  [phone] 15 tablet 0   mirtazapine (REMERON) 15 MG tablet TAKE 1 TABLET BY MOUTH AT BEDTIME 30 tablet 0   niacin 500 MG tablet Take 500 mg by mouth at bedtime.     nitroGLYCERIN (NITROSTAT) 0.4 MG SL tablet PLACE 1 TABLET UNDER  Randy TONGUE EVERY 5 MINUTES AS NEEDED FOR CHEST PAIN. 25 tablet 0   promethazine (PHENERGAN) 25 MG tablet TAKE 1 TABLET BY MOUTH EVERY 8 HOURS AS NEEDED FOR NAUSEA AND VOMITING 30 tablet 0   sertraline (ZOLOFT) 100 MG tablet Take 1 tablet by mouth once daily 90 tablet 0   simvastatin (ZOCOR) 20 MG tablet Take 1 tablet (20 mg total) by mouth daily at 6 PM. 90 tablet 3   triamcinolone ointment (KENALOG) 0.1 % Apply 1 Application topically 2 (two) times daily. To affected areas (legs) 60 g 6   No current facility-administered medications for this visit.    REVIEW OF SYSTEMS:   Constitutional: ( - ) fevers, ( - )  chills , ( - ) night sweats Eyes: ( - ) blurriness of vision, ( - ) double vision, ( - ) watery eyes Ears, nose, mouth, throat, and face: ( - ) mucositis, ( - ) sore throat Respiratory: ( - ) cough, ( - ) dyspnea, ( - ) wheezes Cardiovascular: ( - ) palpitation, ( - ) chest discomfort, ( - ) lower extremity swelling Gastrointestinal:  ( - ) nausea, ( - ) heartburn, ( - ) change in bowel habits Skin: ( - ) abnormal skin rashes Lymphatics: ( - ) new lymphadenopathy, ( - ) easy bruising Neurological: ( - ) numbness, ( - ) tingling, ( - ) new weaknesses Behavioral/Psych: ( - ) mood change, ( - ) new changes  All other systems were reviewed with Randy [name] and are negative.  PHYSICAL EXAMINATION:  Vitals:   01/20/24 1507  BP: 136/69   Pulse: 64  Resp: 13  Temp: 97.9 F (36.6 C)  SpO2: 100%   Filed Weights   01/20/24 1507  Weight: 144 lb 14.4 oz (65.7 kg)    GENERAL: Well-appearing elderly Caucasian male, alert, no distress and comfortable SKIN: skin color, texture, turgor are normal, no rashes or significant lesions EYES: conjunctiva are pink and non-injected, sclera clear LUNGS: clear to auscultation and percussion with normal breathing effort HEART: regular rate & rhythm and no murmurs and no lower extremity edema Musculoskeletal: no cyanosis of digits and no clubbing  PSYCH: alert & oriented x 3, fluent speech NEURO: no focal motor/sensory deficits  LABORATORY DATA:  I have reviewed Randy data as listed    Latest Ref Rng & Units 01/20/2024    2:48 PM 09/16/2023    3:00 PM 05/13/2023   12:26 PM  CBC  WBC 4.0 - 10.5 K/uL 4.8  5.4  3.6   Hemoglobin 13.0 - 17.0 g/dL 52.8  41.3  24.4   Hematocrit 39.0 - 52.0 % 37.3  35.8  34.9   Platelets 150 - 400 K/uL 157  136  127        Latest Ref Rng & Units 01/20/2024    2:48 PM 09/16/2023    3:00 PM 05/13/2023   12:26 PM  CMP  Glucose 70 - 99 mg/dL 010  272  81   BUN 8 - 23 mg/dL 26  30  28    Creatinine 0.61 - 1.24 mg/dL 5.36  6.44  0.34   Sodium 135 - 145 mmol/L 140  139  139   Potassium 3.5 - 5.1 mmol/L 4.4  4.7  4.6   Chloride 98 - 111 mmol/L 106  106  107   CO2 22 - 32 mmol/L 28  28  27    Calcium 8.9 - 10.3 mg/dL 9.0  9.1  8.9   Total  Protein 6.5 - 8.1 g/dL 6.5  6.4  5.5   Total Bilirubin 0.0 - 1.2 mg/dL 0.4  0.4  0.3   Alkaline Phos 38 - 126 U/L 69  78  65   AST 15 - 41 U/L 17  16  16    ALT 0 - 44 U/L 10  11  9     RADIOGRAPHIC STUDIES: No results found.  ASSESSMENT & PLAN Randy NIXON Sr. 89 y.o. male with medical history significant for castration resistant adenocarcinoma of Randy prostate who presents for a follow up visit.  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 daily with Lupron every 4 months. -- At last check testosterone was undetectable.  Recheck today.  PSA 2.7 on 09/16/2023, today PSA 2.5 -- Other labs show white blood cell 4.8, hemoglobin 12.5, MCV 92.6, platelets 157.  Creatinine 0.93 -- Plan for [name] return to clinic every 4 months for continued evaluation.  No orders of Randy defined types were placed in this encounter.   All questions were answered. Randy [name] knows to call Randy clinic with any problems, questions or concerns.  A total of more than 30 minutes were spent on this encounter with face-to-face time and non-face-to-face time, including preparing to see Randy [name], ordering tests and/or medications, counseling Randy [name] and coordination of care as outlined above.   Ulysees Barns, MD Department of Hematology/Oncology [facility] at [facility] Phone: [phone] Pager: [phone] Email: [email or web address]  01/22/2024 1:18 PM

## 2024-01-21 LAB — TESTOSTERONE: Testosterone: <3 ng/dL — ABNORMAL LOW (ref 264–916)

## 2024-01-21 LAB — PROSTATE-SPECIFIC AG, SERUM (LABCORP): Prostate Specific Ag, Serum: 2.5 ng/mL (ref 0.0–4.0)

## 2024-01-22 ENCOUNTER — Encounter: Payer: Self-pay | Admitting: Hematology and Oncology

## 2024-01-23 ENCOUNTER — Telehealth: Payer: Self-pay | Admitting: *Deleted

## 2024-01-23 ENCOUNTER — Telehealth: Payer: Self-pay | Admitting: Hematology and Oncology

## 2024-01-23 NOTE — Telephone Encounter (Signed)
-----   Message from Ulysees Barns IV sent at 01/22/2024  1:19 PM EDT ----- Please let Mr. Kneece know that his PSA continues to improve.  It is dropped down to 2.5.  We will continue shots and lab checks every 4 months.  Additionally we will continue his apalutamide p.o. therapy every day ----- Message ----- From: Interface, Lab In [place] Sent: 01/20/2024   3:00 PM EDT To: Jaci Standard, MD

## 2024-01-23 NOTE — Telephone Encounter (Signed)
 TCT patient regarding recent lab results.  No answer on his home phone or his mobile phone. Will call later in the day.

## 2024-01-25 ENCOUNTER — Other Ambulatory Visit: Payer: Self-pay | Admitting: Family Medicine

## 2024-01-25 DIAGNOSIS — F325 Major depressive disorder, single episode, in full remission: Secondary | ICD-10-CM

## 2024-01-30 ENCOUNTER — Telehealth: Payer: Self-pay

## 2024-01-30 NOTE — Telephone Encounter (Signed)
 Copied from CRM [id number]. Topic: Appointments - Transfer of Care >> Jan 30, 2024  4:02 PM Denese Killings wrote: Pt is requesting to transfer FROM: Saralyn Pilar PA Pt is requesting to transfer TO: Leatha Gilding NP Reason for requested transfer: Patient wants to a clinic closer to home  It is the responsibility of the team the patient would like to transfer to Kara Dies NP) to reach out to the patient if for any reason this transfer is not acceptable.

## 2024-01-31 DIAGNOSIS — I361 Nonrheumatic tricuspid (valve) insufficiency: Secondary | ICD-10-CM | POA: Insufficient documentation

## 2024-01-31 DIAGNOSIS — I340 Nonrheumatic mitral (valve) insufficiency: Secondary | ICD-10-CM | POA: Insufficient documentation

## 2024-01-31 DIAGNOSIS — I2510 Atherosclerotic heart disease of native coronary artery without angina pectoris: Secondary | ICD-10-CM | POA: Insufficient documentation

## 2024-01-31 NOTE — Telephone Encounter (Signed)
Noted   Fine with me

## 2024-01-31 NOTE — Progress Notes (Unsigned)
 Cardiology Office Note:   Date:  02/02/2024  ID:  Randy Skill Sr., DOB Jan 04, 1930, MRN [id number] PCP: Melida Quitter, PA  [facility] Providers Cardiologist:  Rollene Rotunda, MD {  History of Present Illness:   Randy ODRISCOLL Sr. is a 89 y.o. male with the above-mentioned past medical history who presents today for follow-up of AF with RVR.  Randy [name] was admitted 06/21/2022 with complaint of sternal chest pain with dizziness.  He was given sublingual nitroglycerin and EMS was called and patient was found to have AF with rapid ventricular rate.  Repeat EKG in ED revealed ST elevation in inferior leads with ST depression he was started on Cardizem drip and IV heparin.  2D echo was completed with EF of 35-40%, moderately decreased LV function, RWMA, grade 2 DD RV function, moderate to severe MV regurgitation and moderate-severe TR regurgitation.  Left heart catheter completed and revealed severe multivessel calcifications with diffuse calcification of the proximal to mid LAD with 50% proximal stenosis and 60% mid stenosis; subtotal ostial stenosis of a small ramus intermediate vessel; 50% ostial circumflex stenosis with 95% calcified OM1 stenosis; and total occlusion of the mid RCA.  Medical therapy was recommended.  The patient's had no new symptoms.  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  He denies any new cardiovascular symptoms.  In particular he is not having any new shortness of breath, PND or orthopnea.  He is not having any palpitations, presyncope or syncope.  He has had no weight gain or edema.  ROS: Mild lower extremity burning discomfort below the knees.  Otherwise as stated in the HPI and negative for all other systems.  Studies Reviewed:    EKG:   EKG Interpretation Date/Time:  Thursday February 02 2024 16:00:51 EDT Ventricular Rate:  64 PR Interval:  140 QRS Duration:  92 QT Interval:  420 QTC Calculation: 433 R  Axis:   21  Text Interpretation: Normal sinus rhythm with sinus arrhythmia Nonspecific T wave abnormality When compared with ECG of 11-Jul-2022 00:02, No significant change since last tracing Confirmed by Rollene Rotunda ([id number]) on 02/02/2024 4:21:00 PM    Risk Assessment/Calculations:    CHA2DS2-VASc Score = 5  R This indicates a 7.2% annual risk of stroke. The patient's score is based upon: CHF History: 1 HTN History: 1 Diabetes History: 0 Stroke History: 0 Vascular Disease History: 1 Age Score: 2 Gender Score: 0       Physical Exam:   VS:  BP (!) 102/52   Pulse 64   Ht 5\' 7"  (1.702 m)   Wt 145 lb (65.8 kg)   SpO2 95%   BMI 22.71 kg/m    Wt Readings from Last 3 Encounters:  02/02/24 145 lb (65.8 kg)  01/20/24 144 lb 14.4 oz (65.7 kg)  10/27/23 147 lb (66.7 kg)     GEN: Well nourished, well developed in no acute distress NECK: No JVD; No carotid bruits CARDIAC: RRR, very soft apical systolic murmur nonradiating, no diastolic murmurs, rubs, gallops RESPIRATORY:  Clear to auscultation without rales, wheezing or rhonchi  ABDOMEN: Soft, non-tender, non-distended EXTREMITIES:  No edema; No deformity   ASSESSMENT AND PLAN:   NSTEMI: He has coronary disease as described above and has done well with medical management.  No change in therapy.  AF with RVR: He has had no symptomatic tachypalpitations and is in sinus rhythm today.  No change in therapy.  He tolerates  anticoagulation and he is on the appropriate dose for his age and weight  Ischemic cardiomyopathy/chronic HFrEF: He does have a mildly reduced ejection fraction.  His blood pressure would not allow med titration.  He has class I symptoms.  His age precludes more advanced therapies.  No change.  Hypertension: He has not had any orthostatic symptoms.  Blood pressures have previously been in the 90s.  I think he is tolerating meds as listed.  Moderate/severe MR and TR: No change in therapy.  He does have a mildly  reduced ejection fraction and significant valvular disease but I do not think given his age he be a good candidate for repair and he is not having any new symptoms.  No further imaging.  We will follow him symptomatically. 2 lbs overnight or 5 lbs in one week.      Follow up with Glena Norfolk in six months   Signed, Rollene Rotunda, MD

## 2024-02-02 ENCOUNTER — Ambulatory Visit: Attending: Cardiology | Admitting: Cardiology

## 2024-02-02 ENCOUNTER — Encounter: Payer: Self-pay | Admitting: Cardiology

## 2024-02-02 ENCOUNTER — Other Ambulatory Visit: Payer: Self-pay | Admitting: Cardiology

## 2024-02-02 VITALS — BP 102/52 | HR 64 | Ht 67.0 in | Wt 145.0 lb

## 2024-02-02 DIAGNOSIS — I255 Ischemic cardiomyopathy: Secondary | ICD-10-CM | POA: Diagnosis not present

## 2024-02-02 DIAGNOSIS — I4891 Unspecified atrial fibrillation: Secondary | ICD-10-CM | POA: Diagnosis not present

## 2024-02-02 DIAGNOSIS — I361 Nonrheumatic tricuspid (valve) insufficiency: Secondary | ICD-10-CM | POA: Diagnosis not present

## 2024-02-02 DIAGNOSIS — I340 Nonrheumatic mitral (valve) insufficiency: Secondary | ICD-10-CM

## 2024-02-02 DIAGNOSIS — I10 Essential (primary) hypertension: Secondary | ICD-10-CM | POA: Diagnosis not present

## 2024-02-02 DIAGNOSIS — I2510 Atherosclerotic heart disease of native coronary artery without angina pectoris: Secondary | ICD-10-CM | POA: Diagnosis not present

## 2024-02-02 NOTE — Patient Instructions (Signed)
 Medication Instructions:  No changes.  *If you need a refill on your cardiac medications before your next appointment, please call your pharmacy*  Follow-Up: At [facility], you and your health needs are our priority.  As part of our continuing mission to provide you with exceptional heart care, our providers are all part of one team.  This team includes your primary Cardiologist (physician) and Advanced Practice Providers or APPs (Physician Assistants and Nurse Practitioners) who all work together to provide you with the care you need, when you need it.  Your next appointment:   6 month(s)  Provider:   Robin Searing, NP         We recommend signing up for the patient portal called "MyChart".  Sign up information is provided on this After Visit Summary.  MyChart is used to connect with patients for Virtual Visits (Telemedicine).  Patients are able to view lab/test results, encounter notes, upcoming appointments, etc.  Non-urgent messages can be sent to your provider as well.   To learn more about what you can do with MyChart, go to [email or web address].   Other Instructions       1st Floor: - Lobby - Registration  - Pharmacy  - Lab - Cafe  2nd Floor: - PV Lab - Diagnostic Testing (echo, CT, nuclear med)  3rd Floor: - Vacant  4th Floor: - TCTS (cardiothoracic surgery) - AFib Clinic - Structural Heart Clinic - Vascular Surgery  - Vascular Ultrasound  5th Floor: - HeartCare Cardiology (general and EP) - Clinical Pharmacy for coumadin, hypertension, lipid, weight-loss medications, and med management appointments    Valet parking services will be available as well.

## 2024-02-09 ENCOUNTER — Telehealth: Payer: Self-pay

## 2024-02-09 NOTE — Telephone Encounter (Signed)
 Spoke with son Arlys John and told him the results of the PSA as noted below by Dr. Leonides Schanz on 01-22-24.  Son concerned that the testosterone level was very low. Sabas Sous that the treatment for prostate  works to block or reduce the hormone's effects on cancer cells.  Arlys John did not realize this.  He is also concerned about his dad's blood sugar level at 121. His dad does like sweets. Sabas Sous that this lab work was not fasting. He could speak with his dad's PCP and see if a Hgb A1C could be done to get a more reliable picture of his blood sugar level for a 3 month period of time.  Arlys John will look into this.

## 2024-02-09 NOTE — Telephone Encounter (Signed)
-----   Message from Nurse Lanora Manis T sent at 02/09/2024  2:12 PM EDT -----  ----- Message ----- From: Jaci Standard, MD Sent: 01/22/2024   1:19 PM EDT To: Kyra Searles, RN  Please let Mr. Massman know that his PSA continues to improve.  It is dropped down to 2.5.  We will continue shots and lab checks every 4 months.  Additionally we will continue his apalutamide p.o. therapy every day ----- Message ----- From: Interface, Lab In [place] Sent: 01/20/2024   3:00 PM EDT To: Jaci Standard, MD

## 2024-02-12 ENCOUNTER — Other Ambulatory Visit: Payer: Self-pay | Admitting: Family Medicine

## 2024-02-12 DIAGNOSIS — F5101 Primary insomnia: Secondary | ICD-10-CM

## 2024-02-16 ENCOUNTER — Other Ambulatory Visit: Payer: Self-pay | Admitting: *Deleted

## 2024-02-16 DIAGNOSIS — C61 Malignant neoplasm of prostate: Secondary | ICD-10-CM

## 2024-02-16 MED ORDER — ERLEADA 60 MG PO TABS: 240.0000 mg | ORAL_TABLET | Freq: Every day | ORAL | 1 refills | Status: DC

## 2024-02-17 ENCOUNTER — Other Ambulatory Visit: Payer: Self-pay | Admitting: Cardiology

## 2024-02-22 ENCOUNTER — Other Ambulatory Visit: Payer: Self-pay

## 2024-02-22 MED ORDER — LOSARTAN POTASSIUM 25 MG PO TABS: 25.0000 mg | ORAL_TABLET | Freq: Every day | ORAL | 3 refills | Status: DC

## 2024-02-29 ENCOUNTER — Encounter: Payer: Self-pay | Admitting: Family Medicine

## 2024-02-29 ENCOUNTER — Ambulatory Visit (INDEPENDENT_AMBULATORY_CARE_PROVIDER_SITE_OTHER): Admitting: Family Medicine

## 2024-02-29 VITALS — BP 109/64 | HR 83 | Ht 67.0 in | Wt 149.4 lb

## 2024-02-29 DIAGNOSIS — F5101 Primary insomnia: Secondary | ICD-10-CM | POA: Diagnosis not present

## 2024-02-29 DIAGNOSIS — R7309 Other abnormal glucose: Secondary | ICD-10-CM

## 2024-02-29 DIAGNOSIS — I4891 Unspecified atrial fibrillation: Secondary | ICD-10-CM | POA: Diagnosis not present

## 2024-02-29 DIAGNOSIS — C61 Malignant neoplasm of prostate: Secondary | ICD-10-CM | POA: Diagnosis not present

## 2024-02-29 DIAGNOSIS — I5022 Chronic systolic (congestive) heart failure: Secondary | ICD-10-CM

## 2024-02-29 LAB — POCT GLYCOSYLATED HEMOGLOBIN (HGB A1C): HbA1c POC (<> result, manual entry): 5.7 % (ref 4.0–5.6)

## 2024-02-29 NOTE — Progress Notes (Signed)
   Established Patient Office Visit  Subjective   Patient ID: Randy DOUDS Sr., male    DOB: 02-05-1930  Age: 89 y.o. MRN: [id number]  Chief Complaint  Patient presents with   Medical Management of Chronic Issues    HPI  Subjective: - Reports insomnia. Taking mirtazapine  15mg , 2 tablets at night. States gets 8-10 hrs sleep nightly. No daytime naps. - Previously on Ambien , states was effective but no longer prescribed due to age. - Wakes at 4am to use bathroom. - Reports hearing difficulty, especially in one ear. 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 - Tried hearing aids from [facility] but couldn't get them to work. Considering [facility].  Past Medical History: - Prostate cancer s/p surgery "long time ago" - on Erlyada (for testosterone  suppression) - Hyperlipidemia on statin - Cardiac history on metoprolol , losartan , Eliquis  - Depression on sertraline  - Recent labs showed elevated blood sugar, low cholesterol - Son moved back in with them   The ASCVD Risk score (Arnett DK, et al., 2019) failed to calculate for the following reasons:   The 2019 ASCVD risk score is only valid for ages 22 to 55   Risk score cannot be calculated because patient has a medical history suggesting prior/existing ASCVD  Health Maintenance Due  Topic Date Due   Zoster Vaccines- Shingrix (1 of 2) Never done   COVID-19 Vaccine (4 - 2024-25 season) 07/10/2023      Objective:     BP 109/64   Pulse 83   Ht 5\' 7"  (1.702 m)   Wt 149 lb 6.4 oz (67.8 kg)   SpO2 100%   BMI 23.40 kg/m    Physical Exam Gen: alert, oriented Heent: perrla, eomi.  Tortuous ear canals Cv: rrr Pulm:lctab    Results for orders placed or performed in visit on 02/29/24  POCT HgB A1C  Result Value Ref Range   Hemoglobin A1C     HbA1c POC (<> result, manual entry) 5.7 4.0 - 5.6 %   HbA1c, POC (prediabetic range)     HbA1c, POC (controlled diabetic range)          Assessment & Plan:    Atrial fibrillation, unspecified type [facility]) Assessment & Plan: continue metoprolol  and eliquis .  Follows w/ cardiology   Elevated glucose -     POCT glycosylated hemoglobin (Hb A1C)  Chronic systolic heart failure (HCC) Assessment & Plan: EF 35-40%.  On BB, ARB. BP cannot tolerate additional treatment   Prostate cancer metastatic to multiple sites [facility]) Assessment & Plan: Follows w/ oncology, Dr. Dirk Fredericks.  Takes erleada .     Primary insomnia Assessment & Plan: Will send in mirtazapine  as 30mg  since he is currently taking 2 of his 15mg  tabs   Orders: -     Mirtazapine ; Take 1 tablet (30 mg total) by mouth at bedtime.     Return in about 6 months (around 08/30/2024) for HTN.    Laneta Pintos, MD

## 2024-02-29 NOTE — Patient Instructions (Signed)
 It was nice to see you today,  We addressed the following topics today: -Your A1c was 5.7.  This is slightly high but still okay.  You can lower by reducing sugar and carbohydrate intake. - I will send in your next dose of mirtazapine  as 30 mg that way you do not have to take 2 tablets.  Just take 1.  Have a great day,  Etha Henle, MD

## 2024-03-03 MED ORDER — MIRTAZAPINE 30 MG PO TABS
30.0000 mg | ORAL_TABLET | Freq: Every day | ORAL | Status: DC
Start: 2024-03-03 — End: 2024-03-07

## 2024-03-03 NOTE — Assessment & Plan Note (Signed)
 Will send in mirtazapine  as 30mg  since he is currently taking 2 of his 15mg  tabs

## 2024-03-03 NOTE — Assessment & Plan Note (Signed)
 continue metoprolol  and eliquis .  Follows w/ cardiology

## 2024-03-03 NOTE — Assessment & Plan Note (Signed)
 EF 35-40%.  On BB, ARB. BP cannot tolerate additional treatment

## 2024-03-03 NOTE — Assessment & Plan Note (Signed)
 Follows w/ oncology, Dr. Dirk Fredericks.  Takes erleada .

## 2024-03-07 ENCOUNTER — Telehealth: Payer: Self-pay

## 2024-03-07 MED ORDER — MIRTAZAPINE 30 MG PO TABS: 30.0000 mg | ORAL_TABLET | Freq: Every day | ORAL | 5 refills | Status: DC

## 2024-03-07 NOTE — Telephone Encounter (Signed)
 Copied from CRM [id number]. Topic: Clinical - Prescription Issue >> Mar 07, 2024  2:56 PM Randy [name] wrote: Reason for CRM: Patient son called to check on prescription. Wanted to see if prescription for mirtazapine  (REMERON ) 30 MG tablet was sent. Per chart looks like it was increased but not sent to pharmacy. Caller would like sent to [facility]. Thank You

## 2024-03-07 NOTE — Addendum Note (Signed)
 Addended by: Laneta Pintos on: 03/07/2024 08:51 PM   Modules accepted: Orders

## 2024-03-07 NOTE — Telephone Encounter (Signed)
 The patient stated he had not run out of his previous prescription so the new prescription was not sent in yet.  I have sent in the new prescription so it should be ready to pick up soon.

## 2024-03-08 NOTE — Telephone Encounter (Signed)
 Pt informed of below.

## 2024-03-23 ENCOUNTER — Encounter (HOSPITAL_COMMUNITY): Payer: Self-pay

## 2024-03-23 ENCOUNTER — Other Ambulatory Visit: Payer: Self-pay

## 2024-03-23 ENCOUNTER — Ambulatory Visit: Payer: Self-pay

## 2024-03-23 ENCOUNTER — Emergency Department (HOSPITAL_COMMUNITY)
Admission: EM | Admit: 2024-03-23 | Discharge: 2024-03-23 | Disposition: A | Discharge status: Home / Self Care | Attending: Emergency Medicine | Admitting: Emergency Medicine

## 2024-03-23 DIAGNOSIS — Z7901 Long term (current) use of anticoagulants: Secondary | ICD-10-CM | POA: Insufficient documentation

## 2024-03-23 DIAGNOSIS — Z79899 Other long term (current) drug therapy: Secondary | ICD-10-CM | POA: Diagnosis not present

## 2024-03-23 DIAGNOSIS — R339 Retention of urine, unspecified: Secondary | ICD-10-CM | POA: Diagnosis present

## 2024-03-23 DIAGNOSIS — N3001 Acute cystitis with hematuria: Secondary | ICD-10-CM

## 2024-03-23 DIAGNOSIS — K4090 Unilateral inguinal hernia, without obstruction or gangrene, not specified as recurrent: Secondary | ICD-10-CM | POA: Diagnosis not present

## 2024-03-23 DIAGNOSIS — I10 Essential (primary) hypertension: Secondary | ICD-10-CM | POA: Insufficient documentation

## 2024-03-23 DIAGNOSIS — Z8546 Personal history of malignant neoplasm of prostate: Secondary | ICD-10-CM | POA: Insufficient documentation

## 2024-03-23 DIAGNOSIS — I2510 Atherosclerotic heart disease of native coronary artery without angina pectoris: Secondary | ICD-10-CM | POA: Insufficient documentation

## 2024-03-23 LAB — URINALYSIS, ROUTINE W REFLEX MICROSCOPIC
Bilirubin Urine: NEGATIVE
Glucose, UA: NEGATIVE mg/dL
Ketones, ur: NEGATIVE mg/dL
Nitrite: POSITIVE — AB
Protein, ur: 100 mg/dL — AB
RBC / HPF: >50 RBC/hpf (ref 0–5)
Specific Gravity, Urine: 1.019 (ref 1.005–1.030)
WBC, UA: >50 WBC/hpf (ref 0–5)
pH: 5 (ref 5.0–8.0)

## 2024-03-23 LAB — CBC
HCT: 35.5 % — ABNORMAL LOW (ref 39.0–52.0)
Hemoglobin: 11.9 g/dL — ABNORMAL LOW (ref 13.0–17.0)
MCH: 32.6 pg (ref 26.0–34.0)
MCHC: 33.5 g/dL (ref 30.0–36.0)
MCV: 97.3 fL (ref 80.0–100.0)
Platelets: 165 10*3/uL (ref 150–400)
RBC: 3.65 MIL/uL — ABNORMAL LOW (ref 4.22–5.81)
RDW: 13.2 % (ref 11.5–15.5)
WBC: 5.2 10*3/uL (ref 4.0–10.5)
nRBC: 0 % (ref 0.0–0.2)

## 2024-03-23 LAB — BASIC METABOLIC PANEL WITH GFR
Anion gap: 9 (ref 5–15)
BUN: 29 mg/dL — ABNORMAL HIGH (ref 8–23)
CO2: 23 mmol/L (ref 22–32)
Calcium: 8.7 mg/dL — ABNORMAL LOW (ref 8.9–10.3)
Chloride: 104 mmol/L (ref 98–111)
Creatinine, Ser: 0.99 mg/dL (ref 0.61–1.24)
GFR, Estimated: >60 mL/min (ref >60)
Glucose, Bld: 102 mg/dL — ABNORMAL HIGH (ref 70–99)
Potassium: 4.3 mmol/L (ref 3.5–5.1)
Sodium: 136 mmol/L (ref 135–145)

## 2024-03-23 MED ORDER — CEFUROXIME AXETIL 500 MG PO TABS
500.0000 mg | ORAL_TABLET | Freq: Two times a day (BID) | ORAL | 0 refills | Status: AC
Start: 2024-03-23 — End: 2024-03-30

## 2024-03-23 MED ORDER — CEFUROXIME AXETIL 500 MG PO TABS
500.0000 mg | ORAL_TABLET | Freq: Once | ORAL | Status: AC
  Administered 2024-03-23: 500 mg via ORAL
  Filled 2024-03-23: qty 1

## 2024-03-23 NOTE — Discharge Instructions (Addendum)
 You were seen in the emergency department for your difficulty with urination.  You did not retain any urine on your ultrasound but you do have a urinary tract infection.  I have given you a course of antibiotics he should complete this as prescribed.  You can follow-up with your primary doctor in the next few days to have your symptoms rechecked.  You should return to the emergency department if you have fevers despite the antibiotics, repetitive vomiting and cannot tolerate the antibiotics, severe abdominal or back pain or any other new or concerning symptoms.

## 2024-03-23 NOTE — Telephone Encounter (Signed)
 Copied from CRM [id number]. Topic: Clinical - Red Word Triage >> Mar 23, 2024  2:25 PM Kevelyn M wrote: Red Word that prompted transfer to Nurse Triage: Patient's son Isai Petricca. Is calling in hard time urinating for the last few days.Color of urine is pink. Patient is has no energy.  Chief Complaint: unable to urinate, feels bad, urine is pink Symptoms: see above Frequency: constant started two days ago Pertinent Negatives: Patient denies na Disposition: [x] ED /[] Urgent Care (no appt availability in office) / [] Appointment(In office/virtual)/ []  [facility]/ [] Home Care/ [] Refused Recommended Disposition /[] [facility]/ []  Follow-up with PCP Additional Notes: instructed to go to ER; pcp office updated.   Reason for Disposition  [1] Unable to urinate (or only a few drops) > 4 hours AND [2] bladder feels very full (e.g., palpable bladder or strong urge to urinate)  Answer Assessment - Initial Assessment Questions 1. SYMPTOM: "What's the main symptom you're concerned about?" (e.g., frequency, incontinence)     Difficulty urinating 2. ONSET: "When did the  difficultly with urination  start?"     Three days ago 3. PAIN: "Is there any pain?" If Yes, ask: "How bad is it?" (Scale: 1-10; mild, moderate, severe)     Going to ER 4. CAUSE: "What do you think is causing the symptoms?"     unknown 5. OTHER SYMPTOMS: "Do you have any other symptoms?" (e.g., blood in urine, fever, flank pain, pain with urination)     Pink urine, no energy 6. PREGNANCY: "Is there any chance you are pregnant?" "When was your last menstrual period?"     na  Protocols used: Urinary Symptoms-A-AH

## 2024-03-23 NOTE — ED Provider Notes (Signed)
 Patient signed out to me at 1600 by Dr. Val Garin pending labs and UA. In short this is a 89 year old male with PMH of BPH & prior prostate cancer, CAD, hypertension, hyperlipidemia presenting to the emergency department with dysuria and urinary urinary urgency..  Bladder scan on arrival that was 0 postvoid was hemodynamically stable in no acute distress.  Does have a reducible left inguinal hernia.  Patient's labs and urine are pending at this time.  Clinical Course as of 03/23/24 1807  Fri Mar 23, 2024  1654 UA with rare bacteria, leuks and nitrite positive, will be given antibiotics. No previous urine cultures and will send culture today in the setting of complicated UTI. [VK]  1740 Labs otherwise within normal range. Patient is stable for discharge home with outpatient follow up. [VK]    Clinical Course User Index [VK] Kingsley, Keyshla Tunison K, DO      Kingsley, Talia Hoheisel K, [place] 03/23/24 1807

## 2024-03-23 NOTE — ED Notes (Signed)
 Pt. Given ham sandwich and cola

## 2024-03-23 NOTE — ED Triage Notes (Signed)
 Pt came in for urinary retention since last night. Pt stated "there was a light pink of urine" in his brief. Pt states there's a burning sensation after he stops urinating but does not feel anything during.

## 2024-03-23 NOTE — ED Provider Notes (Signed)
 [place] EMERGENCY DEPARTMENT AT [facility] Provider Note   CSN: [id number] Arrival date & time: 03/23/24  1511     History  Chief Complaint  Patient presents with   Urinary Retention    Randy HENNESS Sr. is a 89 y.o. male.  HPI Patient presents with potential urinary retention.  States decreased urination since last night.  Did have some length pink urine.  Does have previous history of prostate cancer.  Also less swelling in left groin.   Past Medical History:  Diagnosis Date   Acute MI (HCC)    Arthritis    CAD (coronary artery disease)    Coronary artery disease    GERD (gastroesophageal reflux disease)    HLD (hyperlipidemia)    HTN (hypertension)    Hyperlipidemia    Hypertension    NSTEMI (non-ST elevated myocardial infarction) (HCC) 06/23/2022   Prostate cancer (HCC)    Wears glasses     Home Medications Prior to Admission medications   Medication Sig Start Date End Date Taking? Authorizing Provider  acetaminophen  (TYLENOL ) 500 MG tablet Take 500 mg by mouth every 6 (six) hours as needed for mild pain or moderate pain.    [provider]  apixaban  (ELIQUIS ) 2.5 MG TABS tablet Take 1 tablet (2.5 mg total) by mouth 2 (two) times daily. 10/31/23   Eilleen Grates, MD  ERLEADA  60 MG tablet Take 4 tablets (240 mg total) by mouth daily. 02/16/24   Dorsey, John T IV, MD  fish oil-omega-3 fatty acids 1000 MG capsule Take 1 g by mouth 2 (two) times daily.     [provider]  LINZESS  145 MCG CAPS capsule Take 145 mcg by mouth daily as needed (constipation). 01/19/22   [provider]  losartan  (COZAAR ) 25 MG tablet Take 1 tablet (25 mg total) by mouth daily. 02/22/24   Eilleen Grates, MD  metoprolol  tartrate (LOPRESSOR ) 25 MG tablet Take 1 tablet (25 mg total) by mouth 2 (two) times daily. 02/17/24   Eilleen Grates, MD  mirtazapine  (REMERON ) 30 MG tablet Take 1 tablet (30 mg total) by mouth at bedtime. 03/07/24   Laneta Pintos,  MD  niacin  500 MG tablet Take 500 mg by mouth at bedtime.    [provider]  nitroGLYCERIN  (NITROSTAT ) 0.4 MG SL tablet PLACE 1 TABLET UNDER THE TONGUE EVERY 5 MINUTES AS NEEDED FOR CHEST PAIN. 10/19/23   Maryclare Smoke A, PA  promethazine  (PHENERGAN ) 25 MG tablet TAKE 1 TABLET BY MOUTH EVERY 8 HOURS AS NEEDED FOR NAUSEA AND VOMITING 01/25/24   Laneta Pintos, MD  sertraline  (ZOLOFT ) 100 MG tablet Take 1 tablet by mouth once daily 01/25/24   Laneta Pintos, MD  simvastatin  (ZOCOR ) 20 MG tablet Take 1 tablet (20 mg total) by mouth daily at 6 PM. 08/09/23   Noreene Bearded, PA  triamcinolone  ointment (KENALOG ) 0.1 % Apply 1 Application topically 2 (two) times daily. To affected areas (legs) 10/27/23   Noreene Bearded, PA      Allergies    Nsaids    Review of Systems   Review of Systems  Physical Exam Updated Vital Signs BP 126/70 (BP Location: Right Arm)   Pulse 89   Temp 97.6 F (36.4 C) (Oral)   Resp 18   Ht 5\' 7"  (1.702 m)   Wt 65.8 kg   SpO2 100%   BMI 22.71 kg/m  Physical Exam Vitals reviewed.  Cardiovascular:     Rate and Rhythm:  Regular rhythm.  Pulmonary:     Breath sounds: No rales.  Abdominal:     Palpations: There is no mass.     Tenderness: There is no abdominal tenderness.     Comments: Midline scar from previous surgery infraumbilical.  Also reducible left inguinal hernia.  Genitourinary:    Penis: Normal.   Musculoskeletal:     Cervical back: Neck supple.  Skin:    Capillary Refill: Capillary refill takes less than 2 seconds.  Neurological:     Mental Status: He is alert and oriented to person, place, and time.     ED Results / Procedures / Treatments   Labs (all labs ordered are listed, but only abnormal results are displayed) Labs Reviewed  CBC - Abnormal; Notable for the following components:      Result Value   RBC 3.65 (*)    Hemoglobin 11.9 (*)    HCT 35.5 (*)    All other components within normal limits  URINALYSIS, ROUTINE W  REFLEX MICROSCOPIC  BASIC METABOLIC PANEL WITH GFR    EKG None  Radiology No results found.  Procedures Procedures    Medications Ordered in ED Medications - No data to display  ED Course/ Medical Decision Making/ A&P                                 Medical Decision Making Amount and/or Complexity of Data Reviewed Labs: ordered.   Patient with decreased urination.  Is on anticoagulation.  History of prostate cancer.  No large urinary retention on bedside ultrasound.  Does have inguinal hernia that reduces.  States he is seen surgery before and they were just following.  Will get urinalysis and basic blood work.  Hopefully should be able to go home.  Benign exam.  Care turned over to Dr. Nora Beal        Final Clinical Impression(s) / ED Diagnoses Final diagnoses:  Unilateral inguinal hernia without obstruction or gangrene, recurrence not specified    Rx / DC Orders ED Discharge Orders     None         Mozell Arias, MD 03/23/24 1610

## 2024-03-25 LAB — URINE CULTURE: Culture: >=100000 — AB

## 2024-03-26 ENCOUNTER — Telehealth (HOSPITAL_BASED_OUTPATIENT_CLINIC_OR_DEPARTMENT_OTHER): Payer: Self-pay | Admitting: *Deleted

## 2024-03-26 NOTE — Telephone Encounter (Signed)
 Post ED Visit - Positive Culture Follow-up  Culture report reviewed by antimicrobial stewardship pharmacist: [facility] []  Court Distance, Pharm.D. []  Skeet Duke, Pharm.D., BCPS AQ-ID []  Leslee Rase, Pharm.D., BCPS [x]  Garland Junk, Pharm.D., BCPS []  [place], [address].D., BCPS, AAHIVP []  Alcide Aly, Pharm.D., BCPS, AAHIVP []  Jerri Morale, PharmD, BCPS []  Graham Laws, PharmD, BCPS []  Cleda Curly, PharmD, BCPS []  Tamar Fairly, PharmD []  Ballard Levels, PharmD, BCPS []  Ollen Beverage, PharmD  [facility] []  Arlyne Bering, PharmD []  Sherryle Don, PharmD []  Van Gelinas, PharmD []  Delila Felty, Rph []  [place]) Cleora Daft, PharmD []  Augustina Block, PharmD []  Arie Kurtz, PharmD []  Sharlyn Deaner, PharmD []  Agnes Hose, PharmD []  Kendall Pauls, PharmD []  Gladstone Lamer, PharmD []  Armanda Bern, PharmD []  Tera Fellows, PharmD   Positive urine culture Treated with Cefuroxime , organism sensitive to the same and no further patient follow-up is required at this time.  Randy [name] 03/26/2024, 10:37 AM

## 2024-03-28 ENCOUNTER — Other Ambulatory Visit: Payer: Self-pay | Admitting: Cardiology

## 2024-04-04 ENCOUNTER — Other Ambulatory Visit: Payer: Self-pay | Admitting: *Deleted

## 2024-04-04 DIAGNOSIS — C61 Malignant neoplasm of prostate: Secondary | ICD-10-CM

## 2024-04-04 MED ORDER — ERLEADA 60 MG PO TABS: 240.0000 mg | ORAL_TABLET | Freq: Every day | ORAL | 1 refills | Status: DC

## 2024-04-11 ENCOUNTER — Ambulatory Visit: Payer: Self-pay | Admitting: Family Medicine

## 2024-04-11 NOTE — Telephone Encounter (Signed)
 FYI Only or Action Required?: FYI only for provider  Patient was last seen in primary care on 02/29/2024 by Laneta Pintos, MD. Called Nurse Triage reporting Hematuria. Symptoms began yesterday. Interventions attempted: Nothing. Symptoms are: unchanged.  Triage Disposition: See Physician Within 24 Hours  Patient/caregiver understands and will follow disposition?:        Copied from CRM [id number]. Topic: Clinical - Red Word Triage >> Apr 11, 2024 11:45 AM DeAngela L wrote: Red Word that prompted transfer to Nurse Triage: Patient son calling in to report he noticed the Patient still has blood in his urine Reason for Disposition  Blood in urine  (Exception: Could be normal menstrual bleeding.)  Answer Assessment - Initial Assessment Questions COLOR of URINE: "Describe the color of the urine."  (e.g., tea-colored, pink, red, bloody) "Do you have blood clots in your urine?" (e.g., none, pea, grape, small coin)     Blood in urine noticed yesterday, pink in color ONSET: "When did the bleeding start?"      Patient was seen in hospital a couple of weeks ago for blood in his urine. They found a hernia. Patient took some medication for 7 days and now he has blood in his urine again PAIN with URINATION: "Is there any pain with passing your urine?" If Yes, ask: "How bad is the pain?"  (Scale 1-10; or mild, moderate, severe)    - MILD: Complains slightly about urination hurting.    - MODERATE: Interferes with normal activities.      - SEVERE: Excruciating, unwilling or unable to urinate because of the pain.      Not mentioned FEVER: "Do you have a fever?" If Yes, ask: "What is your temperature, how was it measured, and when did it start?"     No ASSOCIATED SYMPTOMS: "Are you passing urine more frequently than usual?"     "He is always getting up and down" OTHER SYMPTOMS: "Do you have any other symptoms?" (e.g., back/flank pain, abdomen pain, vomiting)     More weak than normal for past 3-4  months  Protocols used: Urine - Blood In-A-AH

## 2024-04-12 ENCOUNTER — Ambulatory Visit (INDEPENDENT_AMBULATORY_CARE_PROVIDER_SITE_OTHER): Admitting: Family Medicine

## 2024-04-12 ENCOUNTER — Encounter: Payer: Self-pay | Admitting: Family Medicine

## 2024-04-12 VITALS — BP 111/69 | HR 69 | Ht 67.0 in | Wt 145.0 lb

## 2024-04-12 DIAGNOSIS — N3001 Acute cystitis with hematuria: Secondary | ICD-10-CM | POA: Insufficient documentation

## 2024-04-12 LAB — POCT URINALYSIS DIP (CLINITEK)
Bilirubin, UA: NEGATIVE
Glucose, UA: NEGATIVE mg/dL
Ketones, POC UA: NEGATIVE mg/dL
Nitrite, UA: POSITIVE — AB
POC PROTEIN,UA: >=300 — AB
Spec Grav, UA: >=1.03 — AB (ref 1.010–1.025)
Urobilinogen, UA: 0.2 U/dL
pH, UA: 6 (ref 5.0–8.0)

## 2024-04-12 MED ORDER — SULFAMETHOXAZOLE-TRIMETHOPRIM 800-160 MG PO TABS: 1.0000 | ORAL_TABLET | Freq: Two times a day (BID) | ORAL | 0 refills | Status: AC

## 2024-04-12 NOTE — Patient Instructions (Signed)
 It was nice to see you today,  We addressed the following topics today: -Your urinalysis still suggested that you have a UTI.  I am sending in a different antibiotic and a month a urine culture.  In 1 week I would like you to return for a lab visit to repeat your urine culture.  Have a great day,  Etha Henle, MD

## 2024-04-12 NOTE — Progress Notes (Signed)
   Acute Office Visit  Subjective:     Patient ID: Randy WELTZ Sr., male    DOB: Feb 27, 1930, 89 y.o.   MRN: [id number]  Chief Complaint  Patient presents with   Urinary Tract Infection    HPI  Subjective - Hematuria - Dysuria, burning at tip of urethra when urinating - Denies pain around bladder - Reports burning sensation started after noticing blood in urine - Completed 7-day course of antibiotics for UTI with E. coli on urine culture - Symptoms persist despite antibiotic treatment  Medications: Erliada (prostate cancer), Linzess  (bowel), receives injection for prostate cancer  PMH: Prostate cancer PSH: None mentioned FH: None mentioned Social Hx: Fills prescriptions at [facility]  ROS: Positive for dysuria, hematuria. Negative for bladder pain.     ROS      Objective:    BP 111/69   Pulse 69   Ht 5\' 7"  (1.702 m)   Wt 145 lb (65.8 kg)   SpO2 99%   BMI 22.71 kg/m    Physical Exam  Gen: alert, oriented Pulm: no resp distress Psych: pleasant affect  Results for orders placed or performed in visit on 04/12/24  POCT URINALYSIS DIP (CLINITEK)  Result Value Ref Range   Color, UA other (A) yellow   Clarity, UA cloudy (A) clear   Glucose, UA negative negative mg/dL   Bilirubin, UA negative negative   Ketones, POC UA negative negative mg/dL   Spec Grav, UA >=4.098 (A) 1.010 - 1.025   Blood, UA large (A) negative   pH, UA 6.0 5.0 - 8.0   POC PROTEIN,UA >=300 (A) negative, trace   Urobilinogen, UA 0.2 0.2 or 1.0 E.U./dL   Nitrite, UA Positive (A) Negative   Leukocytes, UA Trace (A) Negative        Assessment & Plan:   Acute cystitis with hematuria Assessment & Plan: Symptoms did not improve with previous abx prescribed on 5/16. Previous culture showed pansensitive e. Coli.  Repeat urinalysis today showed blood, LE, and Nitrites.  Sending in for culture.  Prescribing 7 days bactrim.  Repeating culture in 7 days.   Orders: -     Urine Culture -      Urine Culture; Future -     POCT URINALYSIS DIP (CLINITEK)  Other orders -     Sulfamethoxazole-Trimethoprim; Take 1 tablet by mouth 2 (two) times daily for 7 days.  Dispense: 14 tablet; Refill: 0     No follow-ups on file.  Laneta Pintos, MD

## 2024-04-12 NOTE — Assessment & Plan Note (Addendum)
 Symptoms did not improve with previous abx prescribed on 5/16. Previous culture showed pansensitive e. Coli.  Repeat urinalysis today showed blood, LE, and Nitrites.  Sending in for culture.  Prescribing 7 days bactrim.  Repeating culture in 7 days.

## 2024-04-15 LAB — URINE CULTURE

## 2024-04-16 ENCOUNTER — Ambulatory Visit: Payer: Self-pay | Admitting: Family Medicine

## 2024-04-18 ENCOUNTER — Telehealth: Payer: Self-pay | Admitting: Cardiology

## 2024-04-18 DIAGNOSIS — I4891 Unspecified atrial fibrillation: Secondary | ICD-10-CM

## 2024-04-18 MED ORDER — APIXABAN 5 MG PO TABS: 5.0000 mg | ORAL_TABLET | Freq: Two times a day (BID) | ORAL | 5 refills | Status: AC

## 2024-04-18 MED ORDER — APIXABAN 2.5 MG PO TABS: 2.5000 mg | ORAL_TABLET | Freq: Two times a day (BID) | ORAL | 1 refills | Status: DC

## 2024-04-18 NOTE — Telephone Encounter (Signed)
*  STAT* If patient is at the pharmacy, call can be transferred to refill team.   1. Which medications need to be refilled? (please list name of each medication and dose if known) apixaban  (ELIQUIS ) 2.5 MG TABS tablet   2. Which pharmacy/location (including street and city if local pharmacy) is medication to be sent to?  [id number] - [place], [place] - [address]    3. Do they need a 30 day or 90 day supply? 90

## 2024-04-18 NOTE — Telephone Encounter (Signed)
 Prescription refill request for Randy [name]  received. Indication: Afib  Last office visit: 02/02/24 (Hoch[place])  Scr: 0.99 (03/23/24)  Age: 89 Weight: 65.8kg   Per dosing criteria, pt's current dose is not appropriate. Discussed with Randy [name], PharmD who agree pt's dose should be increased to 5mg  BID. I called and spoke with Pt's spouse, Randy [name] and made her aware of dose change. She verbalized understanding and appreciated the update. She requested that the refill only be a 30 day supply due to cost. Refill sent.   I called [facility] and spoke with Randy [name]. Made him aware pt's prescription for Randy [name]  is changing from 2.5mg  to 5mg  tablet. Verbalized understanding.

## 2024-04-19 ENCOUNTER — Ambulatory Visit (INDEPENDENT_AMBULATORY_CARE_PROVIDER_SITE_OTHER)

## 2024-04-19 DIAGNOSIS — N3001 Acute cystitis with hematuria: Secondary | ICD-10-CM | POA: Diagnosis not present

## 2024-04-19 NOTE — Progress Notes (Signed)
 Patient ID: Randy NAVARRA Sr., male   DOB: 1930/09/13, 89 y.o.   MRN: [id number]

## 2024-04-21 LAB — URINE CULTURE: Organism ID, Bacteria: NO GROWTH

## 2024-04-24 ENCOUNTER — Other Ambulatory Visit: Payer: Self-pay | Admitting: Family Medicine

## 2024-05-02 ENCOUNTER — Encounter: Admitting: Family Medicine

## 2024-05-07 ENCOUNTER — Other Ambulatory Visit: Payer: Self-pay | Admitting: Cardiology

## 2024-05-10 ENCOUNTER — Encounter (HOSPITAL_COMMUNITY): Payer: Self-pay | Admitting: Emergency Medicine

## 2024-05-10 ENCOUNTER — Other Ambulatory Visit: Payer: Self-pay

## 2024-05-10 ENCOUNTER — Emergency Department (HOSPITAL_COMMUNITY)
Admission: EM | Admit: 2024-05-10 | Discharge: 2024-05-10 | Disposition: A | Discharge status: Home / Self Care | Attending: Emergency Medicine | Admitting: Emergency Medicine

## 2024-05-10 DIAGNOSIS — Z7901 Long term (current) use of anticoagulants: Secondary | ICD-10-CM | POA: Insufficient documentation

## 2024-05-10 DIAGNOSIS — Z79899 Other long term (current) drug therapy: Secondary | ICD-10-CM | POA: Diagnosis not present

## 2024-05-10 DIAGNOSIS — Z8546 Personal history of malignant neoplasm of prostate: Secondary | ICD-10-CM | POA: Insufficient documentation

## 2024-05-10 DIAGNOSIS — I2510 Atherosclerotic heart disease of native coronary artery without angina pectoris: Secondary | ICD-10-CM | POA: Insufficient documentation

## 2024-05-10 DIAGNOSIS — K4090 Unilateral inguinal hernia, without obstruction or gangrene, not specified as recurrent: Secondary | ICD-10-CM | POA: Insufficient documentation

## 2024-05-10 DIAGNOSIS — I10 Essential (primary) hypertension: Secondary | ICD-10-CM | POA: Insufficient documentation

## 2024-05-10 NOTE — ED Triage Notes (Signed)
  Patient comes in with L inguinal hernia pain that started around an hour ago.  Patient states the hernia is always sticking out but has been painless.  Was told he needed surgery a few months ago but declined.  About an hour ago he started it started hurting while watching tv.  Pain 8/10, pressure.

## 2024-05-10 NOTE — Discharge Instructions (Addendum)
 You can go to a medical supply store to see if you can get a truss to help keep the hernia from popping out.  Please follow-up with the surgeon to discuss treatment options.

## 2024-05-10 NOTE — ED Provider Notes (Signed)
 [place] EMERGENCY DEPARTMENT AT [facility] Provider Note   CSN: [id number] Arrival date & time: 05/10/24  2319     Patient presents with: Inguinal Hernia   Randy VOSLER Sr. is a 89 y.o. male.   The history is provided by the patient.  He has history of hypertension, hyperlipidemia, coronary artery disease, prostate cancer and has a known left inguinal hernia which popped out tonight.  He had been in the emergency department about 6 weeks ago with similar problem but has not followed up with surgery.  He denies nausea or vomiting.  He is complaining of pain in the hernia site.   Prior to Admission medications   Medication Sig Start Date End Date Taking? Authorizing Provider  acetaminophen  (TYLENOL ) 500 MG tablet Take 500 mg by mouth every 6 (six) hours as needed for mild pain or moderate pain.    [provider]  apixaban  (ELIQUIS ) 5 MG TABS tablet Take 1 tablet (5 mg total) by mouth 2 (two) times daily. 04/18/24   Lavona Agent, MD  ERLEADA  60 MG tablet Take 4 tablets (240 mg total) by mouth daily. 04/04/24   Dorsey, John T IV, MD  fish oil-omega-3 fatty acids 1000 MG capsule Take 1 g by mouth 2 (two) times daily.     [provider]  LINZESS  145 MCG CAPS capsule Take 145 mcg by mouth daily as needed (constipation). 01/19/22   [provider]  losartan  (COZAAR ) 25 MG tablet Take 1 tablet (25 mg total) by mouth daily. 03/28/24   Pietro Redell RAMAN, MD  metoprolol  tartrate (LOPRESSOR ) 25 MG tablet Take 1 tablet by mouth twice daily 05/08/24   Lavona Agent, MD  mirtazapine  (REMERON ) 30 MG tablet Take 1 tablet (30 mg total) by mouth at bedtime. 03/07/24   Chandra Toribio POUR, MD  niacin  500 MG tablet Take 500 mg by mouth at bedtime.    [provider]  nitroGLYCERIN  (NITROSTAT ) 0.4 MG SL tablet PLACE 1 TABLET UNDER THE TONGUE EVERY 5 MINUTES AS NEEDED FOR CHEST PAIN. 10/19/23   Wallace Search A, PA  promethazine  (PHENERGAN ) 25 MG tablet TAKE 1  TABLET BY MOUTH EVERY 8 HOURS AS NEEDED FOR NAUSEA AND VOMITING 01/25/24   Chandra Toribio POUR, MD  sertraline  (ZOLOFT ) 100 MG tablet Take 1 tablet by mouth once daily 01/25/24   Chandra Toribio POUR, MD  simvastatin  (ZOCOR ) 20 MG tablet Take 1 tablet (20 mg total) by mouth daily at 6 PM. 08/09/23   Wallace Search LABOR, PA  triamcinolone  ointment (KENALOG ) 0.1 % Apply 1 Application topically 2 (two) times daily. To affected areas (legs) 10/27/23   Wallace Search LABOR, PA    Allergies: Nsaids    Review of Systems  All other systems reviewed and are negative.   Updated Vital Signs BP 134/77   Pulse 84   Resp 18   Ht 5' 7 (1.702 m)   Wt 65.8 kg   SpO2 100%   BMI 22.71 kg/m   Physical Exam Vitals and nursing note reviewed.   89 year old male, resting comfortably and in no acute distress. Vital signs are normal. Oxygen saturation is 100%, which is normal. Head is normocephalic and atraumatic. PERRLA, EOMI.  Lungs are clear without rales, wheezes, or rhonchi. Chest is nontender. Heart has regular rate and rhythm without murmur. Abdomen is soft.  There is a direct left inguinal hernia present. Extremities have no cyanosis or edem Skin is warm and dry without rash. Neurologic: Awake  and alert, ambulatory, moves all extremities equally.    Hernia reduction  Date/Time: 05/10/2024 11:45 PM  Performed by: Raford Lenis, MD Authorized by: Raford Lenis, MD  Consent: Verbal consent obtained. Written consent not obtained Risks and benefits: risks, benefits and alternatives were discussed Consent given by: patient Patient understanding: patient states understanding of the procedure being performed Patient consent: the patient's understanding of the procedure matches consent given Procedure consent: procedure consent matches procedure scheduled Relevant documents: relevant documents present and verified Site marked: the operative site was marked Imaging studies: imaging studies available Required  items: required blood products, implants, devices, and special equipment available Patient identity confirmed: verbally with patient and arm band Time out: Immediately prior to procedure a time out was called to verify the correct patient, procedure, equipment, support staff and site/side marked as required. Local anesthesia used: no  Anesthesia: Local anesthesia used: no  Sedation: Patient sedated: no  Patient tolerance: patient tolerated the procedure well with no immediate complications Comments: Successful reduction of left inguinal hernia.      Medications Ordered in the ED - No data to display                                  Medical Decision Making  Direct left inguinal hernia successfully reduced in the emergency department.  I have reviewed his past records, and note ED visit on 03/23/2024 for left inguinal hernia.  I am referring him to general surgery for follow-up.     Final diagnoses:  Direct left inguinal hernia    ED Discharge Orders     None          Raford Lenis, MD 05/10/24 2346

## 2024-05-14 ENCOUNTER — Other Ambulatory Visit: Payer: Self-pay | Admitting: Hematology and Oncology

## 2024-05-14 DIAGNOSIS — C61 Malignant neoplasm of prostate: Secondary | ICD-10-CM

## 2024-05-15 ENCOUNTER — Encounter: Admitting: Nurse Practitioner

## 2024-05-18 ENCOUNTER — Inpatient Hospital Stay: Attending: Hematology and Oncology

## 2024-05-18 ENCOUNTER — Inpatient Hospital Stay

## 2024-05-18 ENCOUNTER — Inpatient Hospital Stay (HOSPITAL_BASED_OUTPATIENT_CLINIC_OR_DEPARTMENT_OTHER): Admitting: Hematology and Oncology

## 2024-05-18 ENCOUNTER — Telehealth: Payer: Self-pay | Admitting: *Deleted

## 2024-05-18 ENCOUNTER — Other Ambulatory Visit: Payer: Self-pay | Admitting: Hematology and Oncology

## 2024-05-18 VITALS — BP 121/80 | HR 65 | Temp 97.3°F | Resp 13 | Wt 140.7 lb

## 2024-05-18 DIAGNOSIS — I2510 Atherosclerotic heart disease of native coronary artery without angina pectoris: Secondary | ICD-10-CM | POA: Insufficient documentation

## 2024-05-18 DIAGNOSIS — C61 Malignant neoplasm of prostate: Secondary | ICD-10-CM

## 2024-05-18 DIAGNOSIS — I252 Old myocardial infarction: Secondary | ICD-10-CM | POA: Diagnosis not present

## 2024-05-18 DIAGNOSIS — K219 Gastro-esophageal reflux disease without esophagitis: Secondary | ICD-10-CM | POA: Diagnosis not present

## 2024-05-18 DIAGNOSIS — Z192 Hormone resistant malignancy status: Secondary | ICD-10-CM | POA: Diagnosis not present

## 2024-05-18 DIAGNOSIS — Z8546 Personal history of malignant neoplasm of prostate: Secondary | ICD-10-CM

## 2024-05-18 DIAGNOSIS — I10 Essential (primary) hypertension: Secondary | ICD-10-CM | POA: Insufficient documentation

## 2024-05-18 DIAGNOSIS — E785 Hyperlipidemia, unspecified: Secondary | ICD-10-CM | POA: Diagnosis not present

## 2024-05-18 DIAGNOSIS — Z79818 Long term (current) use of other agents affecting estrogen receptors and estrogen levels: Secondary | ICD-10-CM | POA: Diagnosis not present

## 2024-05-18 DIAGNOSIS — Z79899 Other long term (current) drug therapy: Secondary | ICD-10-CM | POA: Insufficient documentation

## 2024-05-18 DIAGNOSIS — Z7901 Long term (current) use of anticoagulants: Secondary | ICD-10-CM | POA: Diagnosis not present

## 2024-05-18 LAB — CBC WITH DIFFERENTIAL (CANCER CENTER ONLY)
Abs Immature Granulocytes: 0.12 K/uL — ABNORMAL HIGH (ref 0.00–0.07)
Basophils Absolute: 0 K/uL (ref 0.0–0.1)
Basophils Relative: 1 %
Eosinophils Absolute: 0.4 K/uL (ref 0.0–0.5)
Eosinophils Relative: 8 %
HCT: 38.6 % — ABNORMAL LOW (ref 39.0–52.0)
Hemoglobin: 13 g/dL (ref 13.0–17.0)
Immature Granulocytes: 3 %
Lymphocytes Relative: 14 %
Lymphs Abs: 0.7 K/uL (ref 0.7–4.0)
MCH: 31.4 pg (ref 26.0–34.0)
MCHC: 33.7 g/dL (ref 30.0–36.0)
MCV: 93.2 fL (ref 80.0–100.0)
Monocytes Absolute: 0.3 K/uL (ref 0.1–1.0)
Monocytes Relative: 7 %
Neutro Abs: 3.3 K/uL (ref 1.7–7.7)
Neutrophils Relative %: 67 %
Platelet Count: 204 K/uL (ref 150–400)
RBC: 4.14 MIL/uL — ABNORMAL LOW (ref 4.22–5.81)
RDW: 12.8 % (ref 11.5–15.5)
WBC Count: 4.8 K/uL (ref 4.0–10.5)
nRBC: 0 % (ref 0.0–0.2)

## 2024-05-18 LAB — CMP (CANCER CENTER ONLY)
ALT: 7 U/L (ref 0–44)
AST: 14 U/L — ABNORMAL LOW (ref 15–41)
Albumin: 3.9 g/dL (ref 3.5–5.0)
Alkaline Phosphatase: 75 U/L (ref 38–126)
Anion gap: 7 (ref 5–15)
BUN: 23 mg/dL (ref 8–23)
CO2: 28 mmol/L (ref 22–32)
Calcium: 9.6 mg/dL (ref 8.9–10.3)
Chloride: 106 mmol/L (ref 98–111)
Creatinine: 1.04 mg/dL (ref 0.61–1.24)
GFR, Estimated: >60 mL/min (ref >60)
Glucose, Bld: 155 mg/dL — ABNORMAL HIGH (ref 70–99)
Potassium: 4.2 mmol/L (ref 3.5–5.1)
Sodium: 141 mmol/L (ref 135–145)
Total Bilirubin: 0.3 mg/dL (ref 0.0–1.2)
Total Protein: 6.7 g/dL (ref 6.5–8.1)

## 2024-05-18 MED ORDER — LEUPROLIDE ACETATE (4 MONTH) 30 MG ~~LOC~~ KIT
30.0000 mg | PACK | Freq: Once | SUBCUTANEOUS | Status: AC
  Administered 2024-05-18: 30 mg via SUBCUTANEOUS
  Filled 2024-05-18: qty 30

## 2024-05-18 NOTE — Telephone Encounter (Signed)
 Received call from pt's son, Kenta, Laster. He wanted to let Dr. Federico that he is concerned about his father's fatigue. He states that he sleeps 12-14 hours a day and just doesn't have the same he used to.  Advised that his father is on medications that cause fatigue due to decreased testosterone  and that he is 89 yo. Encouraged son to allow rest periods and and to encourage more fluids and high protein foods. Advised that I would let Dr. Federico know of his concerns. Son voiced understanding.

## 2024-05-18 NOTE — Progress Notes (Unsigned)
 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 -Eligard  30 mg every 4 months.    Interval History:  Randy Randy Sr. 89 y.o. male with medical history significant for castration resistant adenocarcinoma of the prostate who presents for a follow up visit. The patient's last visit was on 01/20/2024. In the interim since the last visit he has had no major changes in his health.  On exam today Mr. Randy [name] reports he continues taking his Erleada  240 mg p.o. daily with no difficulty.  He reports it is not causing him any symptoms.  He reports that his blood pressures have been good.  He is not currently having any pain anywhere and denies symptoms of nausea, vomiting, or diarrhea.  He reports that he is not having any symptoms or side effects as a result of his prostate treatment including the Lupron  shots.  He denies any hot flashes or sweats.  Overall he feels well and is willing and able to continue with treatment at this time.  A 10 point ROS is otherwise negative.  MEDICAL HISTORY:  Past Medical History:  Diagnosis Date   Acute MI [facility])    Arthritis    CAD (coronary artery disease)    Coronary artery disease    GERD (gastroesophageal reflux disease)    HLD (hyperlipidemia)    HTN (hypertension)    Hyperlipidemia    Hypertension    NSTEMI (non-ST elevated myocardial infarction) (HCC) 06/23/2022   Prostate cancer (HCC)    Wears glasses     SURGICAL HISTORY: Past Surgical History:  Procedure Laterality Date   CORONARY  ANGIOPLASTY     11/30/94 (Dr. Victory Sharps): Mild ant/anteroapical hypokinesis (known ant MI '94), EF 60%. 50-60%pLAD, 60% RI, 50% oRCA, 90% OM1 (s/p PTCA '96)   KNEE ARTHROSCOPY Right 03/01/2018   Procedure: ARTHROSCOPY KNEE;  Surgeon: Liam Lerner, MD;  Location: [facility] OR;  Service: Orthopedics;  Laterality: Right;  debridement of medial and lateral meniscal tears   LEFT HEART CATH AND CORONARY ANGIOGRAPHY N/A 06/22/2022   Procedure: LEFT HEART CATH AND CORONARY ANGIOGRAPHY;  Surgeon: Burnard Debby LABOR, MD;  Location: [facility] INVASIVE CV LAB;  Service: Cardiovascular;  Laterality: N/A;   Lumbar back surgery      SOCIAL HISTORY: Social History   Socioeconomic History   Marital status: Married    Spouse name: Rilla   Number of children: 2   Years of education: Not on file   Highest education level: Not on file  Occupational History    Comment: retired  Tobacco Use   Smoking status: Never    Passive exposure: Never   Smokeless tobacco: Never  Vaping Use   Vaping status: Never Used  Substance and Sexual Activity   Alcohol use: Not Currently   Drug use: Never   Sexual activity: Not Currently  Other Topics Concern   Not on file  Social History Narrative   ** Merged History Encounter **       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     Social Drivers of Health   Financial Resource Strain: Low Risk  (06/28/2023)   Overall  Financial Resource Strain (CARDIA)    Difficulty of Paying Living Expenses: Not hard at all  Food Insecurity: No Food Insecurity (06/28/2023)   Hunger Vital Sign    Worried About Running Out of Food in the Last Year: Never true    Ran Out of Food in the Last Year: Never true  Transportation Needs: No Transportation Needs (06/28/2023)   PRAPARE - Administrator, Civil Service (Medical): No    Lack of Transportation (Non-Medical): No  Physical Activity: Sufficiently Active (06/28/2023)   Exercise Vital Sign    Days of Exercise per Week: 7 days    Minutes of Exercise  per Session: 30 min  Stress: No Stress Concern Present (06/28/2023)   [employer] of Occupational Health - Occupational Stress Questionnaire    Feeling of Stress : Not at all  Social Connections: Socially Integrated (06/28/2023)   Social Connection and Isolation Panel    Frequency of Communication with Friends and Family: More than three times a week    Frequency of Social Gatherings with Friends and Family: More than three times a week    Attends Religious Services: More than 4 times per year    Active Member of [employer] or Organizations: Yes    Attends Engineer, structural: More than 4 times per year    Marital Status: Married  Catering manager Violence: Not At Risk (06/28/2023)   Humiliation, Afraid, Rape, and Kick questionnaire    Fear of Current or Ex-Partner: No    Emotionally Abused: No    Physically Abused: No    Sexually Abused: No    FAMILY HISTORY: Family History  Problem Relation Age of Onset   Alzheimer's disease Mother    Bone cancer Father    Breast cancer Neg Hx    Colon cancer Neg Hx    Prostate cancer Neg Hx    Pancreatic cancer Neg Hx     ALLERGIES:  is allergic to nsaids.  MEDICATIONS:  Current Outpatient Medications  Medication Sig Dispense Refill   acetaminophen  (TYLENOL ) 500 MG tablet Take 500 mg by mouth every 6 (six) hours as needed for mild pain or moderate pain.     apixaban  (ELIQUIS ) 5 MG TABS tablet Take 1 tablet (5 mg total) by mouth 2 (two) times daily. 60 tablet 5   ERLEADA  60 MG tablet TAKE 4 TABLETS BY MOUTH EVERY DAY 120 tablet 1   fish oil-omega-3 fatty acids 1000 MG capsule Take 1 g by mouth 2 (two) times daily.      LINZESS  145 MCG CAPS capsule Take 145 mcg by mouth daily as needed (constipation).     losartan  (COZAAR ) 25 MG tablet Take 1 tablet (25 mg total) by mouth daily. 90 tablet 1   metoprolol  tartrate (LOPRESSOR ) 25 MG tablet Take 1 tablet by mouth twice daily 30 tablet 11   mirtazapine  (REMERON ) 30 MG tablet Take 1  tablet (30 mg total) by mouth at bedtime. 30 tablet 5   niacin  500 MG tablet Take 500 mg by mouth at bedtime.     nitroGLYCERIN  (NITROSTAT ) 0.4 MG SL tablet PLACE 1 TABLET UNDER THE TONGUE EVERY 5 MINUTES AS NEEDED FOR CHEST PAIN. 25 tablet 0   promethazine  (PHENERGAN ) 25 MG tablet TAKE 1 TABLET BY MOUTH EVERY 8 HOURS AS NEEDED FOR NAUSEA AND VOMITING 30 tablet 1   sertraline  (ZOLOFT ) 100 MG tablet Take 1 tablet by mouth once daily 90 tablet 0   simvastatin  (ZOCOR ) 20 MG tablet Take 1  tablet (20 mg total) by mouth daily at 6 PM. 90 tablet 3   triamcinolone  ointment (KENALOG ) 0.1 % Apply 1 Application topically 2 (two) times daily. To affected areas (legs) 60 g 6   No current facility-administered medications for this visit.    REVIEW OF SYSTEMS:   Constitutional: ( - ) fevers, ( - )  chills , ( - ) night sweats Eyes: ( - ) blurriness of vision, ( - ) double vision, ( - ) watery eyes Ears, nose, mouth, throat, and face: ( - ) mucositis, ( - ) sore throat Respiratory: ( - ) cough, ( - ) dyspnea, ( - ) wheezes Cardiovascular: ( - ) palpitation, ( - ) chest discomfort, ( - ) lower extremity swelling Gastrointestinal:  ( - ) nausea, ( - ) heartburn, ( - ) change in bowel habits Skin: ( - ) abnormal skin rashes Lymphatics: ( - ) new lymphadenopathy, ( - ) easy bruising Neurological: ( - ) numbness, ( - ) tingling, ( - ) new weaknesses Behavioral/Psych: ( - ) mood change, ( - ) new changes  All other systems were reviewed with the patient and are negative.  PHYSICAL EXAMINATION:  Vitals:   05/18/24 1449  BP: 121/80  Pulse: 65  Resp: 13  Temp: (!) 97.3 F (36.3 C)  SpO2: 100%    Filed Weights   05/18/24 1449  Weight: 140 lb 11.2 oz (63.8 kg)     GENERAL: Well-appearing elderly Caucasian male, alert, no distress and comfortable SKIN: skin color, texture, turgor are normal, no rashes or significant lesions EYES: conjunctiva are pink and non-injected, sclera clear LUNGS: clear to  auscultation and percussion with normal breathing effort HEART: regular rate & rhythm and no murmurs and no lower extremity edema Musculoskeletal: no cyanosis of digits and no clubbing  PSYCH: alert & oriented x 3, fluent speech NEURO: no focal motor/sensory deficits  LABORATORY DATA:  I have reviewed the data as listed    Latest Ref Rng & Units 05/18/2024    2:32 PM 03/23/2024    4:03 PM 01/20/2024    2:48 PM  CBC  WBC 4.0 - 10.5 K/uL 4.8  5.2  4.8   Hemoglobin 13.0 - 17.0 g/dL 86.9  88.0  87.4   Hematocrit 39.0 - 52.0 % 38.6  35.5  37.3   Platelets 150 - 400 K/uL 204  165  157        Latest Ref Rng & Units 05/18/2024    2:32 PM 03/23/2024    3:54 PM 01/20/2024    2:48 PM  CMP  Glucose 70 - 99 mg/dL 844  897  878   BUN 8 - 23 mg/dL 23  29  26    Creatinine 0.61 - 1.24 mg/dL 8.95  9.00  9.06   Sodium 135 - 145 mmol/L 141  136  140   Potassium 3.5 - 5.1 mmol/L 4.2  4.3  4.4   Chloride 98 - 111 mmol/L 106  104  106   CO2 22 - 32 mmol/L 28  23  28    Calcium 8.9 - 10.3 mg/dL 9.6  8.7  9.0   Total Protein 6.5 - 8.1 g/dL 6.7   6.5   Total Bilirubin 0.0 - 1.2 mg/dL 0.3   0.4   Alkaline Phos 38 - 126 U/L 75   69   AST 15 - 41 U/L 14   17   ALT 0 - 44 U/L 7   10  RADIOGRAPHIC STUDIES: No results found.  ASSESSMENT & PLAN CHRISANGEL ESKENAZI Sr. 89 y.o. male with medical history significant for castration resistant adenocarcinoma of the prostate who presents for a follow up visit.  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 daily with Lupron  every 4 months. -- At last check testosterone  was undetectable.  Recheck today.  PSA 2.5 on 01/20/2024. Results pending today.  -- Other labs show white blood cell 4.8, Hgb 13.0, MCV 93.2, Plt 204.  Creatinine 1.04  -- Plan for patient return to clinic every 4 months for continued evaluation.  No orders of the defined types were placed in this  encounter.   All questions were answered. The patient knows to call the clinic with any problems, questions or concerns.  A total of more than 30 minutes were spent on this encounter with face-to-face time and non-face-to-face time, including preparing to see the patient, ordering tests and/or medications, counseling the patient and coordination of care as outlined above.   Norleen IVAR Kidney, MD Department of Hematology/Oncology [facility] Phone: [phone] Pager: [phone] Email: [email or web address]  05/20/2024 5:13 PM

## 2024-05-19 LAB — TESTOSTERONE: Testosterone: <3 ng/dL — ABNORMAL LOW (ref 264–916)

## 2024-05-19 LAB — PROSTATE-SPECIFIC AG, SERUM (LABCORP): Prostate Specific Ag, Serum: 2.6 ng/mL (ref 0.0–4.0)

## 2024-05-20 ENCOUNTER — Encounter: Payer: Self-pay | Admitting: Hematology and Oncology

## 2024-05-21 ENCOUNTER — Other Ambulatory Visit: Payer: Self-pay | Admitting: Nurse Practitioner

## 2024-05-21 DIAGNOSIS — H524 Presbyopia: Secondary | ICD-10-CM | POA: Diagnosis not present

## 2024-05-23 ENCOUNTER — Ambulatory Visit: Payer: Self-pay | Admitting: *Deleted

## 2024-05-23 NOTE — Telephone Encounter (Signed)
 TCT pt's son, Mckennon, [place] regarding his father's recent PSA result. Able to leave vm on identified phone #. Advised that his PSA is stable as compared to his PSA. No changes to treatment plan. We will see his father back here in 4 months as scheduled.

## 2024-05-23 NOTE — Telephone Encounter (Signed)
-----   Message from Norleen ONEIDA Kidney IV sent at 05/20/2024  5:13 PM EDT ----- Please let Randy [name] know that his PSA is stable compared to prior.  We will plan to see him back in 4 months as scheduled. ----- Message ----- From: Rebecka, [facility] [place] Sent: 05/18/2024   2:45 PM EDT To: Norleen ONEIDA Kidney [place], MD

## 2024-06-10 ENCOUNTER — Other Ambulatory Visit: Payer: Self-pay | Admitting: Cardiology

## 2024-06-22 ENCOUNTER — Other Ambulatory Visit: Payer: Self-pay | Admitting: Hematology and Oncology

## 2024-06-22 DIAGNOSIS — R9721 Rising PSA following treatment for malignant neoplasm of prostate: Secondary | ICD-10-CM

## 2024-06-29 ENCOUNTER — Telehealth: Payer: Self-pay | Admitting: Hematology and Oncology

## 2024-06-29 NOTE — Telephone Encounter (Signed)
 I LVM with Nickalous informing him of his re-scheduled appointments for 10/30.

## 2024-07-19 ENCOUNTER — Other Ambulatory Visit: Payer: Self-pay | Admitting: Family Medicine

## 2024-07-19 DIAGNOSIS — F325 Major depressive disorder, single episode, in full remission: Secondary | ICD-10-CM

## 2024-07-21 ENCOUNTER — Other Ambulatory Visit: Payer: Self-pay | Admitting: Family Medicine

## 2024-07-21 DIAGNOSIS — E782 Mixed hyperlipidemia: Secondary | ICD-10-CM

## 2024-07-31 ENCOUNTER — Encounter: Payer: Self-pay | Admitting: *Deleted

## 2024-07-31 NOTE — Progress Notes (Signed)
 " Cardiology Office Note    Patient Name: Randy WOLLMAN Sr. Date of Encounter: 07/31/2024  Primary Care Provider:  Chandra Toribio POUR, MD Primary Cardiologist:  Lynwood Schilling, MD Primary Electrophysiologist: None   Past Medical History    Past Medical History:  Diagnosis Date   Acute MI [facility])    Arthritis    CAD (coronary artery disease)    Coronary artery disease    GERD (gastroesophageal reflux disease)    HLD (hyperlipidemia)    HTN (hypertension)    Hyperlipidemia    Hypertension    NSTEMI (non-ST elevated myocardial infarction) (HCC) 06/23/2022   Prostate cancer (HCC)    Wears glasses     History of Present Illness  Randy ROCKWELL Sr. is a 89 y.o. male with PMH of HTN, HLD, CAD, prostate CA s/p prostatectomy, atrial fibrillation, ischemic cardiomyopathy, who presents for 6-month follow-up.  Randy [name] was last seen on 02/02/2024 by Dr. Schilling for follow-up of atrial fibrillation.  During his follow-up he reported no new symptoms and was not having any shortness of breath.  EKG was completed showing sinus rhythm and rate was controlled and was tolerating anticoagulation.  He did not have room for medication titration for HFrEF due to low blood pressure.  He continues to follow with Dr. Federico for management of prostate cancer with most recent PSA stable.  He is currently following every 4 months for evaluation.  Randy [name] presents today for 41-month follow-up. 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 He manages his activity by working for fifteen minutes and resting for thirty minutes. He is currently taking metoprolol . His blood pressure is well-controlled, typically around 100/50 mmHg, and he monitors it every other day. No atrial fibrillation, fast heart rates, or fluttering. He is on Eliquis , a blood  thinner, which he takes in the morning and evening. His son assists with organizing his medications. No bleeding issues, such as bruising, bleeding gums, or epistaxis, although he notes having thin skin. He describes a burning sensation on one side of his chest when he takes his medication at night and lies down. He also reports urinary leakage and has a history of a hernia, which he attempted to manage himself. He has not sought further medical intervention for the hernia as it does not cause him significant discomfort. He experiences tingling in his toes, which he notices more when not wearing shoes. He has been informed by previous doctors that he does not have circulation problems. Patient denies chest pain, palpitations, dyspnea, PND, orthopnea, nausea, vomiting, dizziness, syncope, edema, weight gain, or early satiety.  Discussed the use of AI scribe software for clinical note transcription with the patient, who gave verbal consent to proceed.  History of Present Illness   Review of Systems  Please see the history of present illness.    All other systems reviewed and are otherwise negative except as noted above.  Physical Exam    Wt Readings from Last 3 Encounters:  05/18/24 140 lb 11.2 oz (63.8 kg)  05/10/24 145 lb (65.8 kg)  04/12/24 145 lb (65.8 kg)   CD:Uyzmz were no vitals filed for this visit.,There is no height or weight on file to calculate BMI. GEN: Well nourished, well developed in no acute distress Neck: No JVD; No carotid bruits Pulmonary: Clear to auscultation without rales,  wheezing or rhonchi  Cardiovascular: Normal rate. Regular rhythm. Normal S1. Normal S2.   Murmurs: 2/6 systolic murmur ABDOMEN: Soft, non-tender, non-distended EXTREMITIES:  No edema; No deformity   EKG/LABS/ Recent Cardiac Studies   ECG personally reviewed by me today -none completed today  Risk Assessment/Calculations:    CHA2DS2-VASc Score = 5   This indicates a 7.2% annual risk of  stroke. The patient's score is based upon: CHF History: 1 HTN History: 1 Diabetes History: 0 Stroke History: 0 Vascular Disease History: 1 Age Score: 2 Gender Score: 0         Lab Results  Component Value Date   WBC 4.8 05/18/2024   HGB 13.0 05/18/2024   HCT 38.6 (L) 05/18/2024   MCV 93.2 05/18/2024   PLT 204 05/18/2024   Lab Results  Component Value Date   CREATININE 1.04 05/18/2024   BUN 23 05/18/2024   NA 141 05/18/2024   K 4.2 05/18/2024   CL 106 05/18/2024   CO2 28 05/18/2024   Lab Results  Component Value Date   CHOL 153 06/21/2022   HDL 39 (L) 06/21/2022   LDLCALC 102 (H) 06/21/2022   LDLDIRECT 92.0 02/03/2021   TRIG 59 06/21/2022   CHOLHDL 3.9 06/21/2022    Lab Results  Component Value Date   HGBA1C 5.7 02/29/2024   Assessment & Plan    Assessment and Plan Assessment & Plan Nonrheumatic mitral valve insufficiency Mitral valve insufficiency with a loose mitral valve, no significant progression, murmur present. - Continue metoprolol  to manage heart rate.  Atrial fibrillation Atrial fibrillation well-controlled, stable heart rate. - Continue metoprolol  and Eliquis . - Monitor for bleeding due to Eliquis .  Essential hypertension Blood pressure well-controlled, no new concerns. - Continue current antihypertensive regimen. - Monitor blood pressure regularly.  Inguinal hernia Inguinal hernia present, no significant symptoms, surgical intervention not considered. - Consider use of a truss. - Avoid heavy lifting and straining.  1.  History of CAD: -s/p LHC on 06/22/2022 due to NSTEMI with multivessel coronary calcifications and 95% first OM and total occlusion of the mid RCA with extensive collateralization and recommendation of medical therapy. - Today patient reports no chest pain or angina since previous follow-up. - Continue current GDMT with Zocor  20 mg, metoprolol  25 mg twice daily, losartan  25 mg  2.  Paroxysmal AF: -Patient is rate  controlled at 60 bpm -Patient's last creatinine was 1.04 and hemoglobin was 13.0 - Continue Eliquis  2.5 mg twice daily  3.  HFrEF/ICM: - 2D echo completed 06/22/2022 with EF of 35-40% and moderately decreased LV function with grade 2 DD and mildly elevated PASP of 51.6 with mildly dilated LA and moderate to severe MR - Today patient is euvolemic on examination and reports no episodes of shortness of breath. - Continue metoprolol  to tartrate 25 mg twice daily -Low sodium diet, fluid restriction <2L, and daily weights encouraged. Educated to contact our office for weight gain of 2 lbs overnight or 5 lbs in one week.   4.  History of prostate CA: - Continue current treatment plan per oncology  5.  Nonrheumatic MR - Moderate/severe MR no plan for surgical repair due to age. -Continue metoprolol  to tartrate 25 mg twice daily     Disposition: Follow-up with Lynwood Schilling, MD or APP in 6 months    Signed, Wyn Raddle, Jackee Shove, NP 07/31/2024, 6:54 PM [facility] "

## 2024-08-01 ENCOUNTER — Encounter: Payer: Self-pay | Admitting: Nurse Practitioner

## 2024-08-01 ENCOUNTER — Ambulatory Visit: Attending: Cardiology | Admitting: Nurse Practitioner

## 2024-08-01 VITALS — BP 100/50 | HR 60 | Ht 68.0 in | Wt 148.4 lb

## 2024-08-01 DIAGNOSIS — I255 Ischemic cardiomyopathy: Secondary | ICD-10-CM

## 2024-08-01 DIAGNOSIS — I10 Essential (primary) hypertension: Secondary | ICD-10-CM

## 2024-08-01 DIAGNOSIS — I2510 Atherosclerotic heart disease of native coronary artery without angina pectoris: Secondary | ICD-10-CM | POA: Diagnosis not present

## 2024-08-01 DIAGNOSIS — I4891 Unspecified atrial fibrillation: Secondary | ICD-10-CM

## 2024-08-01 DIAGNOSIS — I340 Nonrheumatic mitral (valve) insufficiency: Secondary | ICD-10-CM

## 2024-08-01 DIAGNOSIS — Z8546 Personal history of malignant neoplasm of prostate: Secondary | ICD-10-CM

## 2024-08-01 NOTE — Patient Instructions (Signed)
 Medication Instructions:  Your physician recommends that you continue on your current medications as directed. Please refer to the Current Medication list given to you today.  *If you need a refill on your cardiac medications before your next appointment, please call your pharmacy*  Lab Work: None If you have labs (blood work) drawn today and your tests are completely normal, you will receive your results only by: MyChart Message (if you have MyChart) OR A paper copy in the mail If you have any lab test that is abnormal or we need to change your treatment, we will call you to review the results.  Testing/Procedures: None  Follow-Up: At [facility], you and your health needs are our priority.  As part of our continuing mission to provide you with exceptional heart care, our providers are all part of one team.  This team includes your primary Cardiologist (physician) and Advanced Practice Providers or APPs (Physician Assistants and Nurse Practitioners) who all work together to provide you with the care you need, when you need it.  Your next appointment:   6 month(s)  Provider:   Lynwood Schilling, MD     Other Instructions None        Your physician recommends that you continue on your current medications as directed. Please refer to the Current Medication list given to you today.

## 2024-08-07 ENCOUNTER — Other Ambulatory Visit: Payer: Self-pay | Admitting: Family Medicine

## 2024-08-07 DIAGNOSIS — F5101 Primary insomnia: Secondary | ICD-10-CM

## 2024-08-08 ENCOUNTER — Other Ambulatory Visit: Payer: Self-pay | Admitting: Family Medicine

## 2024-08-08 DIAGNOSIS — F5101 Primary insomnia: Secondary | ICD-10-CM

## 2024-08-15 ENCOUNTER — Other Ambulatory Visit: Payer: Self-pay | Admitting: Hematology and Oncology

## 2024-08-15 DIAGNOSIS — R9721 Rising PSA following treatment for malignant neoplasm of prostate: Secondary | ICD-10-CM

## 2024-09-06 ENCOUNTER — Other Ambulatory Visit: Payer: Self-pay | Admitting: Hematology and Oncology

## 2024-09-06 ENCOUNTER — Inpatient Hospital Stay

## 2024-09-06 ENCOUNTER — Inpatient Hospital Stay: Admitting: Hematology and Oncology

## 2024-09-06 ENCOUNTER — Telehealth: Payer: Self-pay | Admitting: *Deleted

## 2024-09-06 ENCOUNTER — Inpatient Hospital Stay: Attending: Hematology and Oncology

## 2024-09-06 DIAGNOSIS — C61 Malignant neoplasm of prostate: Secondary | ICD-10-CM

## 2024-09-06 DIAGNOSIS — R9721 Rising PSA following treatment for malignant neoplasm of prostate: Secondary | ICD-10-CM

## 2024-09-06 NOTE — Telephone Encounter (Signed)
 TCT patient, pt's wife and pt's son regarding pt not coming for his appt today at Dr. Lafonda office. No answer to any calls and no vm available to leave them a message. Sent MyChart message as well. To reschedule.

## 2024-09-06 NOTE — Progress Notes (Deleted)
 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 -Eligard  30 mg every 4 months.    Interval History:  Randy BURGESON Sr. 89 y.o. male with medical history significant for castration resistant adenocarcinoma of the prostate who presents for a follow up visit. The patient's last visit was on 01/20/2024. In the interim since the last visit he has had no major changes in his health.  On exam today Randy [name] reports he continues taking his Erleada  240 mg p.o. daily with no difficulty.  He reports it is not causing him any symptoms.  He reports that his blood pressures have been good.  He is not currently having any pain anywhere and denies symptoms of nausea, vomiting, or diarrhea.  He reports that he is not having any symptoms or side effects as a result of his prostate treatment including the Lupron  shots.  He denies any hot flashes or sweats.  Overall he feels well and is willing and able to continue with treatment at this time.  A 10 point ROS is otherwise negative.  MEDICAL HISTORY:  Past Medical History:  Diagnosis Date   Acute MI [facility])    Arthritis    CAD (coronary artery disease)    Coronary artery disease    GERD (gastroesophageal reflux disease)    HLD (hyperlipidemia)    HTN (hypertension)    Hyperlipidemia    Hypertension    NSTEMI (non-ST elevated myocardial infarction) (HCC) 06/23/2022   Prostate cancer (HCC)    Wears glasses     SURGICAL HISTORY: Past Surgical History:  Procedure Laterality Date   CORONARY  ANGIOPLASTY     11/30/94 (Dr. Victory Sharps): Mild ant/anteroapical hypokinesis (known ant MI '94), EF 60%. 50-60%pLAD, 60% RI, 50% oRCA, 90% OM1 (s/p PTCA '96)   KNEE ARTHROSCOPY Right 03/01/2018   Procedure: ARTHROSCOPY KNEE;  Surgeon: Liam Lerner, MD;  Location: [facility] OR;  Service: Orthopedics;  Laterality: Right;  debridement of medial and lateral meniscal tears   LEFT HEART CATH AND CORONARY ANGIOGRAPHY N/A 06/22/2022   Procedure: LEFT HEART CATH AND CORONARY ANGIOGRAPHY;  Surgeon: Burnard Debby LABOR, MD;  Location[facility] INVASIVE CV LAB;  Service: Cardiovascular;  Laterality: N/A;   Lumbar back surgery      SOCIAL HISTORY: Social History   Socioeconomic History   Marital status: Married    Spouse name: Rilla   Number of children: 2   Years of education: Not on file   Highest education level: Not on file  Occupational History    Comment: retired  Tobacco Use   Smoking status: Never    Passive exposure: Never   Smokeless tobacco: Never  Vaping Use   Vaping status: Never Used  Substance and Sexual Activity   Alcohol use: Not Currently   Drug use: Never   Sexual activity: Not Currently  Other Topics Concern   Not on file  Social History Narrative   ** Merged History Encounter **       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     Social Drivers of Health   Financial Resource Strain: Low Risk  (06/28/2023)   Overall  Financial Resource Strain (CARDIA)    Difficulty of Paying Living Expenses: Not hard at all  Food Insecurity: No Food Insecurity (06/28/2023)   Hunger Vital Sign    Worried About Running Out of Food in the Last Year: Never true    Ran Out of Food in the Last Year: Never true  Transportation Needs: No Transportation Needs (06/28/2023)   PRAPARE - Administrator, Civil Service (Medical): No    Lack of Transportation (Non-Medical): No  Physical Activity: Sufficiently Active (06/28/2023)   Exercise Vital Sign    Days of Exercise per Week: 7 days    Minutes of Exercise  per Session: 30 min  Stress: No Stress Concern Present (06/28/2023)   [employer] of Occupational Health - Occupational Stress Questionnaire    Feeling of Stress : Not at all  Social Connections: Socially Integrated (06/28/2023)   Social Connection and Isolation Panel    Frequency of Communication with Friends and Family: More than three times a week    Frequency of Social Gatherings with Friends and Family: More than three times a week    Attends Religious Services: More than 4 times per year    Active Member of [employer] or Organizations: Yes    Attends Engineer, Structural: More than 4 times per year    Marital Status: Married  Catering Manager Violence: Not At Risk (06/28/2023)   Humiliation, Afraid, Rape, and Kick questionnaire    Fear of Current or Ex-Partner: No    Emotionally Abused: No    Physically Abused: No    Sexually Abused: No    FAMILY HISTORY: Family History  Problem Relation Age of Onset   Alzheimer's disease Mother    Bone cancer Father    Breast cancer Neg Hx    Colon cancer Neg Hx    Prostate cancer Neg Hx    Pancreatic cancer Neg Hx     ALLERGIES:  is allergic to nsaids.  MEDICATIONS:  Current Outpatient Medications  Medication Sig Dispense Refill   acetaminophen  (TYLENOL ) 500 MG tablet Take 500 mg by mouth every 6 (six) hours as needed for mild pain or moderate pain.     apixaban  (ELIQUIS ) 5 MG TABS tablet Take 1 tablet (5 mg total) by mouth 2 (two) times daily. 60 tablet 5   ERLEADA  60 MG tablet TAKE 4 TABLETS BY MOUTH EVERY DAY 120 tablet 1   fish oil-omega-3 fatty acids 1000 MG capsule Take 1 g by mouth 2 (two) times daily.      LINZESS  145 MCG CAPS capsule TAKE 1 CAPSULE BY MOUTH ONCE DAILY BEFORE BREAKFAST 30 capsule 2   losartan  (COZAAR ) 25 MG tablet Take 1 tablet (25 mg total) by mouth daily. 90 tablet 1   metoprolol  tartrate (LOPRESSOR ) 25 MG tablet Take 1 tablet by mouth twice daily 30 tablet 11   mirtazapine  (REMERON ) 30 MG tablet  TAKE 1 TABLET BY MOUTH AT BEDTIME 30 tablet 5   niacin  500 MG tablet Take 500 mg by mouth at bedtime.     nitroGLYCERIN  (NITROSTAT ) 0.4 MG SL tablet PLACE 1 TABLET UNDER THE TONGUE EVERY 5 MINUTES AS NEEDED FOR CHEST PAIN. 25 tablet 0   promethazine  (PHENERGAN ) 25 MG tablet TAKE 1 TABLET BY MOUTH EVERY 8 HOURS AS NEEDED FOR NAUSEA AND VOMITING 30 tablet 1   sertraline  (ZOLOFT ) 100 MG tablet Take 1 tablet by mouth once daily 90 tablet 3   simvastatin  (ZOCOR ) 20 MG tablet TAKE 1 TABLET BY  MOUTH ONCE DAILY AT 6PM . APPOINTMENT REQUIRED FOR FUTURE REFILLS (CALL  OFFICE  TO  SCHEDULE) 90 tablet 3   triamcinolone  ointment (KENALOG ) 0.1 % Apply 1 Application topically 2 (two) times daily. To affected areas (legs) 60 g 6   No current facility-administered medications for this visit.    REVIEW OF SYSTEMS:   Constitutional: ( - ) fevers, ( - )  chills , ( - ) night sweats Eyes: ( - ) blurriness of vision, ( - ) double vision, ( - ) watery eyes Ears, nose, mouth, throat, and face: ( - ) mucositis, ( - ) sore throat Respiratory: ( - ) cough, ( - ) dyspnea, ( - ) wheezes Cardiovascular: ( - ) palpitation, ( - ) chest discomfort, ( - ) lower extremity swelling Gastrointestinal:  ( - ) nausea, ( - ) heartburn, ( - ) change in bowel habits Skin: ( - ) abnormal skin rashes Lymphatics: ( - ) new lymphadenopathy, ( - ) easy bruising Neurological: ( - ) numbness, ( - ) tingling, ( - ) new weaknesses Behavioral/Psych: ( - ) mood change, ( - ) new changes  All other systems were reviewed with the patient and are negative.  PHYSICAL EXAMINATION:  There were no vitals filed for this visit.   There were no vitals filed for this visit.    GENERAL: Well-appearing elderly Caucasian male, alert, no distress and comfortable SKIN: skin color, texture, turgor are normal, no rashes or significant lesions EYES: conjunctiva are pink and non-injected, sclera clear LUNGS: clear to auscultation and percussion with  normal breathing effort HEART: regular rate & rhythm and no murmurs and no lower extremity edema Musculoskeletal: no cyanosis of digits and no clubbing  PSYCH: alert & oriented x 3, fluent speech NEURO: no focal motor/sensory deficits  LABORATORY DATA:  I have reviewed the data as listed    Latest Ref Rng & Units 05/18/2024    2:32 PM 03/23/2024    4:03 PM 01/20/2024    2:48 PM  CBC  WBC 4.0 - 10.5 K/uL 4.8  5.2  4.8   Hemoglobin 13.0 - 17.0 g/dL 86.9  88.0  87.4   Hematocrit 39.0 - 52.0 % 38.6  35.5  37.3   Platelets 150 - 400 K/uL 204  165  157        Latest Ref Rng & Units 05/18/2024    2:32 PM 03/23/2024    3:54 PM 01/20/2024    2:48 PM  CMP  Glucose 70 - 99 mg/dL 844  897  878   BUN 8 - 23 mg/dL 23  29  26    Creatinine 0.61 - 1.24 mg/dL 8.95  9.00  9.06   Sodium 135 - 145 mmol/L 141  136  140   Potassium 3.5 - 5.1 mmol/L 4.2  4.3  4.4   Chloride 98 - 111 mmol/L 106  104  106   CO2 22 - 32 mmol/L 28  23  28    Calcium 8.9 - 10.3 mg/dL 9.6  8.7  9.0   Total Protein 6.5 - 8.1 g/dL 6.7   6.5   Total Bilirubin 0.0 - 1.2 mg/dL 0.3   0.4   Alkaline Phos 38 - 126 U/L 75   69   AST 15 - 41 U/L 14   17   ALT 0 - 44 U/L 7   10    RADIOGRAPHIC STUDIES: No results found.  ASSESSMENT & PLAN Randy GEISSINGER Sr. 89 y.o. male  with medical history significant for castration resistant adenocarcinoma of the prostate who presents for a follow up visit.  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 daily with Lupron  every 4 months. -- At last check testosterone  was undetectable.  Recheck today.  PSA 2.5 on 01/20/2024. Results pending today.  -- Other labs show white blood cell *** -- Plan for patient return to clinic every 4 months for continued evaluation.  No orders of the defined types were placed in this encounter.   All questions were answered. The patient knows to call the clinic with  any problems, questions or concerns.  A total of more than 30 minutes were spent on this encounter with face-to-face time and non-face-to-face time, including preparing to see the patient, ordering tests and/or medications, counseling the patient and coordination of care as outlined above.   Norleen IVAR Kidney, MD Department of Hematology/Oncology [facility] Phone: [phone] Pager: [phone] Email: [email or web address]  09/06/2024 7:37 AM

## 2024-09-07 ENCOUNTER — Inpatient Hospital Stay: Admitting: Hematology and Oncology

## 2024-09-07 ENCOUNTER — Inpatient Hospital Stay

## 2024-09-07 ENCOUNTER — Other Ambulatory Visit: Payer: Self-pay | Admitting: Hematology and Oncology

## 2024-09-07 DIAGNOSIS — I4891 Unspecified atrial fibrillation: Secondary | ICD-10-CM

## 2024-09-10 ENCOUNTER — Telehealth: Payer: Self-pay | Admitting: *Deleted

## 2024-09-10 ENCOUNTER — Encounter: Payer: Self-pay | Admitting: Hematology and Oncology

## 2024-09-10 NOTE — Telephone Encounter (Signed)
 Pt came in and stated he was having elevated BP's, and that he needed to be seen.  Asked pt if he just needed a BP check or did he need to see a provider and he said he needed to be seen.  Informed him that we had only one provider in the office and we told him that we could see him today at 1:30 and when I told him he said he could not wait until 1:30 at which time I told him if he needs to be seen right now then I recommend he go to an UC.  He at which time he turned around and walked out.  Rosina went to speak to the son and informed him that we could see him at 1:30pm and if he only needed a BP check then we could do that and the son stated that we are done with this place.

## 2024-09-13 ENCOUNTER — Other Ambulatory Visit: Payer: Self-pay | Admitting: Family Medicine

## 2024-09-17 NOTE — Progress Notes (Signed)
No show. Rescheduled.

## 2024-09-18 ENCOUNTER — Inpatient Hospital Stay: Attending: Hematology and Oncology

## 2024-09-18 ENCOUNTER — Telehealth: Payer: Self-pay | Admitting: *Deleted

## 2024-09-18 ENCOUNTER — Inpatient Hospital Stay

## 2024-09-18 ENCOUNTER — Inpatient Hospital Stay: Admitting: Hematology and Oncology

## 2024-09-18 DIAGNOSIS — Z8546 Personal history of malignant neoplasm of prostate: Secondary | ICD-10-CM

## 2024-09-18 DIAGNOSIS — C61 Malignant neoplasm of prostate: Secondary | ICD-10-CM

## 2024-09-18 DIAGNOSIS — Z192 Hormone resistant malignancy status: Secondary | ICD-10-CM

## 2024-09-18 NOTE — Telephone Encounter (Signed)
 Patient did not come to appts today. Contacted son. He stated he did not see appts in his father's MyChart and did not arrange to bring him.  Sent schedule message to contact patient's son. Son verbalized understanding that someone would be in touch with him.

## 2024-10-02 ENCOUNTER — Encounter: Payer: Self-pay | Admitting: Hematology and Oncology

## 2024-10-08 ENCOUNTER — Other Ambulatory Visit: Payer: Self-pay | Admitting: Hematology and Oncology

## 2024-10-08 DIAGNOSIS — Z192 Hormone resistant malignancy status: Secondary | ICD-10-CM

## 2024-10-18 ENCOUNTER — Inpatient Hospital Stay: Admitting: Hematology and Oncology

## 2024-10-18 ENCOUNTER — Inpatient Hospital Stay: Attending: Hematology and Oncology

## 2024-10-18 ENCOUNTER — Inpatient Hospital Stay

## 2024-10-18 ENCOUNTER — Other Ambulatory Visit: Payer: Self-pay | Admitting: *Deleted

## 2024-10-18 VITALS — BP 119/75 | HR 81 | Temp 97.2°F | Resp 15 | Wt 153.8 lb

## 2024-10-18 DIAGNOSIS — C61 Malignant neoplasm of prostate: Secondary | ICD-10-CM | POA: Diagnosis present

## 2024-10-18 DIAGNOSIS — K219 Gastro-esophageal reflux disease without esophagitis: Secondary | ICD-10-CM | POA: Insufficient documentation

## 2024-10-18 DIAGNOSIS — I252 Old myocardial infarction: Secondary | ICD-10-CM | POA: Diagnosis not present

## 2024-10-18 DIAGNOSIS — Z79899 Other long term (current) drug therapy: Secondary | ICD-10-CM | POA: Diagnosis not present

## 2024-10-18 DIAGNOSIS — E785 Hyperlipidemia, unspecified: Secondary | ICD-10-CM | POA: Diagnosis not present

## 2024-10-18 DIAGNOSIS — Z7901 Long term (current) use of anticoagulants: Secondary | ICD-10-CM | POA: Diagnosis not present

## 2024-10-18 DIAGNOSIS — R9721 Rising PSA following treatment for malignant neoplasm of prostate: Secondary | ICD-10-CM | POA: Diagnosis not present

## 2024-10-18 DIAGNOSIS — E895 Postprocedural testicular hypofunction: Secondary | ICD-10-CM | POA: Insufficient documentation

## 2024-10-18 DIAGNOSIS — Z192 Hormone resistant malignancy status: Secondary | ICD-10-CM | POA: Diagnosis not present

## 2024-10-18 DIAGNOSIS — I2510 Atherosclerotic heart disease of native coronary artery without angina pectoris: Secondary | ICD-10-CM | POA: Diagnosis not present

## 2024-10-18 DIAGNOSIS — Z8546 Personal history of malignant neoplasm of prostate: Secondary | ICD-10-CM

## 2024-10-18 DIAGNOSIS — Z79818 Long term (current) use of other agents affecting estrogen receptors and estrogen levels: Secondary | ICD-10-CM | POA: Insufficient documentation

## 2024-10-18 DIAGNOSIS — Z809 Family history of malignant neoplasm, unspecified: Secondary | ICD-10-CM | POA: Insufficient documentation

## 2024-10-18 DIAGNOSIS — I10 Essential (primary) hypertension: Secondary | ICD-10-CM | POA: Diagnosis not present

## 2024-10-18 DIAGNOSIS — M1990 Unspecified osteoarthritis, unspecified site: Secondary | ICD-10-CM | POA: Diagnosis not present

## 2024-10-18 LAB — CBC WITH DIFFERENTIAL (CANCER CENTER ONLY)
Abs Immature Granulocytes: 0.03 K/uL (ref 0.00–0.07)
Basophils Absolute: 0 K/uL (ref 0.0–0.1)
Basophils Relative: 1 %
Eosinophils Absolute: 0.5 K/uL (ref 0.0–0.5)
Eosinophils Relative: 12 %
HCT: 37.8 % — ABNORMAL LOW (ref 39.0–52.0)
Hemoglobin: 12.6 g/dL — ABNORMAL LOW (ref 13.0–17.0)
Immature Granulocytes: 1 %
Lymphocytes Relative: 19 %
Lymphs Abs: 0.8 K/uL (ref 0.7–4.0)
MCH: 31.8 pg (ref 26.0–34.0)
MCHC: 33.3 g/dL (ref 30.0–36.0)
MCV: 95.5 fL (ref 80.0–100.0)
Monocytes Absolute: 0.3 K/uL (ref 0.1–1.0)
Monocytes Relative: 7 %
Neutro Abs: 2.4 K/uL (ref 1.7–7.7)
Neutrophils Relative %: 60 %
Platelet Count: 143 K/uL — ABNORMAL LOW (ref 150–400)
RBC: 3.96 MIL/uL — ABNORMAL LOW (ref 4.22–5.81)
RDW: 13.2 % (ref 11.5–15.5)
WBC Count: 4 K/uL (ref 4.0–10.5)
nRBC: 0 % (ref 0.0–0.2)

## 2024-10-18 LAB — CMP (CANCER CENTER ONLY)
ALT: 11 U/L (ref 0–44)
AST: 24 U/L (ref 15–41)
Albumin: 4.2 g/dL (ref 3.5–5.0)
Alkaline Phosphatase: 88 U/L (ref 38–126)
Anion gap: 11 (ref 5–15)
BUN: 16 mg/dL (ref 8–23)
CO2: 27 mmol/L (ref 22–32)
Calcium: 9.4 mg/dL (ref 8.9–10.3)
Chloride: 105 mmol/L (ref 98–111)
Creatinine: 1.05 mg/dL (ref 0.61–1.24)
GFR, Estimated: >60 mL/min (ref >60)
Glucose, Bld: 138 mg/dL — ABNORMAL HIGH (ref 70–99)
Potassium: 4.8 mmol/L (ref 3.5–5.1)
Sodium: 142 mmol/L (ref 135–145)
Total Bilirubin: 0.4 mg/dL (ref 0.0–1.2)
Total Protein: 6.4 g/dL — ABNORMAL LOW (ref 6.5–8.1)

## 2024-10-18 LAB — PSA: Prostatic Specific Antigen: 1.37 ng/mL (ref 0.00–4.00)

## 2024-10-18 MED ORDER — LEUPROLIDE ACETATE (4 MONTH) 30 MG ~~LOC~~ KIT
30.0000 mg | PACK | Freq: Once | SUBCUTANEOUS | Status: AC
  Administered 2024-10-18: 30 mg via SUBCUTANEOUS
  Filled 2024-10-18: qty 30

## 2024-10-18 NOTE — Progress Notes (Signed)
 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 -Eligard  30 mg every 4 months.    Interval History:  Randy RENSTROM Sr. 89 y.o. male with medical history significant for castration resistant adenocarcinoma of the prostate who presents for a follow up visit. The patient's last visit was on 05/18/2024. In the interim since the last visit he has had no major changes in his health.  On exam today Randy [name] reports reports that he has been well overall in the room since our last visit.  He is currently taking his apalutamide  therapy as prescribed with 4 pills/day.  He reports that he is able to tolerate the medication well with no major trouble.  He is not having any hot flashes, nausea, vomiting, or diarrhea.  He reports he is not having any urinary symptoms.  He notes that he has had some occasional problems with his heart in the interim since her last visit but is had no major changes in his health.  He reports he tolerates his Lupron  shots well with no side effects either.  He reports his energy levels has been better.  He reports that he overall feels well and has no additional questions concerns or complaints today.  He is willing and able to proceed with therapy at this time.  A full 10 point ROS is otherwise negative.  MEDICAL HISTORY:  Past Medical History:  Diagnosis Date   Acute MI [facility])    Arthritis    CAD (coronary artery disease)    Coronary artery disease    GERD (gastroesophageal reflux disease)    HLD (hyperlipidemia)     HTN (hypertension)    Hyperlipidemia    Hypertension    NSTEMI (non-ST elevated myocardial infarction) (HCC) 06/23/2022   Prostate cancer (HCC)    Wears glasses     SURGICAL HISTORY: Past Surgical History:  Procedure Laterality Date   CORONARY ANGIOPLASTY     11/30/94 (Dr. Victory Sharps): Mild ant/anteroapical hypokinesis (known ant MI '94), EF 60%. 50-60%pLAD, 60% RI, 50% oRCA, 90% OM1 (s/p PTCA '96)   KNEE ARTHROSCOPY Right 03/01/2018   Procedure: ARTHROSCOPY KNEE;  Surgeon: Liam Lerner, MD;  Location: [facility] OR;  Service: Orthopedics;  Laterality: Right;  debridement of medial and lateral meniscal tears   LEFT HEART CATH AND CORONARY ANGIOGRAPHY N/A 06/22/2022   Procedure: LEFT HEART CATH AND CORONARY ANGIOGRAPHY;  Surgeon: Burnard Debby LABOR, MD;  Location: [facility] INVASIVE CV LAB;  Service: Cardiovascular;  Laterality: N/A;   Lumbar back surgery      SOCIAL HISTORY: Social History   Socioeconomic History   Marital status: Married    Spouse name: Rilla   Number of children: 2   Years of education: Not on file   Highest education level: Not on file  Occupational History    Comment: retired  Tobacco Use   Smoking status: Never    Passive exposure: Never   Smokeless tobacco: Never  Vaping Use   Vaping status: Never Used  Substance and Sexual Activity   Alcohol use: Not Currently   Drug use: Never   Sexual activity: Not Currently  Other  Topics Concern   Not on file  Social History Narrative   ** Merged History Encounter **       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     Social Drivers of Health   Tobacco Use: Low Risk (08/01/2024)   Patient History    Smoking Tobacco Use: Never    Smokeless Tobacco Use: Never    Passive Exposure: Never  Financial Resource Strain: Low Risk (06/28/2023)   Overall Financial Resource Strain (CARDIA)    Difficulty of Paying Living Expenses: Not hard at all  Food Insecurity: No Food Insecurity (06/28/2023)   Hunger Vital Sign    Worried About Running  Out of Food in the Last Year: Never true    Ran Out of Food in the Last Year: Never true  Transportation Needs: No Transportation Needs (06/28/2023)   PRAPARE - Administrator, Civil Service (Medical): No    Lack of Transportation (Non-Medical): No  Physical Activity: Sufficiently Active (06/28/2023)   Exercise Vital Sign    Days of Exercise per Week: 7 days    Minutes of Exercise per Session: 30 min  Stress: No Stress Concern Present (06/28/2023)   [employer] of Occupational Health - Occupational Stress Questionnaire    Feeling of Stress : Not at all  Social Connections: Socially Integrated (06/28/2023)   Social Connection and Isolation Panel    Frequency of Communication with Friends and Family: More than three times a week    Frequency of Social Gatherings with Friends and Family: More than three times a week    Attends Religious Services: More than 4 times per year    Active Member of Clubs or Organizations: Yes    Attends Banker Meetings: More than 4 times per year    Marital Status: Married  Catering Manager Violence: Not At Risk (06/28/2023)   Humiliation, Afraid, Rape, and Kick questionnaire    Fear of Current or Ex-Partner: No    Emotionally Abused: No    Physically Abused: No    Sexually Abused: No  Depression (PHQ2-9): Low Risk (05/18/2024)   Depression (PHQ2-9)    PHQ-2 Score: 0  Alcohol Screen: Low Risk (06/28/2023)   Alcohol Screen    Last Alcohol Screening Score (AUDIT): 0  Housing: Low Risk (06/28/2023)   Housing    Last Housing Risk Score: 0  Utilities: Not At Risk (06/28/2023)   AHC Utilities    Threatened with loss of utilities: No  Health Literacy: Adequate Health Literacy (06/28/2023)   B1300 Health Literacy    Frequency of need for help with medical instructions: Never    FAMILY HISTORY: Family History  Problem Relation Age of Onset   Alzheimer's disease Mother    Bone cancer Father    Breast cancer Neg Hx    Colon cancer  Neg Hx    Prostate cancer Neg Hx    Pancreatic cancer Neg Hx     ALLERGIES:  is allergic to nsaids.  MEDICATIONS:  Current Outpatient Medications  Medication Sig Dispense Refill   acetaminophen  (TYLENOL ) 500 MG tablet Take 500 mg by mouth every 6 (six) hours as needed for mild pain or moderate pain.     apixaban  (ELIQUIS ) 5 MG TABS tablet Take 1 tablet (5 mg total) by mouth 2 (two) times daily. 60 tablet 5   ELIQUIS  2.5 MG TABS tablet Take 1 tablet by mouth twice daily 60 tablet 0   ERLEADA  60 MG tablet TAKE 4 TABLETS BY MOUTH EVERY  DAY 120 tablet 1   fish oil-omega-3 fatty acids 1000 MG capsule Take 1 g by mouth 2 (two) times daily.      LINZESS  145 MCG CAPS capsule TAKE 1 CAPSULE BY MOUTH ONCE DAILY BEFORE BREAKFAST 30 capsule 2   losartan  (COZAAR ) 25 MG tablet Take 1 tablet (25 mg total) by mouth daily. 90 tablet 1   metoprolol  tartrate (LOPRESSOR ) 25 MG tablet Take 1 tablet by mouth twice daily 30 tablet 11   mirtazapine  (REMERON ) 30 MG tablet TAKE 1 TABLET BY MOUTH AT BEDTIME 30 tablet 5   niacin  500 MG tablet Take 500 mg by mouth at bedtime.     nitroGLYCERIN  (NITROSTAT ) 0.4 MG SL tablet PLACE 1 TABLET UNDER THE TONGUE EVERY 5 MINUTES AS NEEDED FOR CHEST PAIN. 25 tablet 0   promethazine  (PHENERGAN ) 25 MG tablet TAKE 1 TABLET BY MOUTH EVERY 8 HOURS AS NEEDED FOR NAUSEA AND VOMITING 30 tablet 0   sertraline  (ZOLOFT ) 100 MG tablet Take 1 tablet by mouth once daily 90 tablet 3   simvastatin  (ZOCOR ) 20 MG tablet TAKE 1 TABLET BY MOUTH ONCE DAILY AT 6PM . APPOINTMENT REQUIRED FOR FUTURE REFILLS (CALL  OFFICE  TO  SCHEDULE) 90 tablet 3   triamcinolone  ointment (KENALOG ) 0.1 % Apply 1 Application topically 2 (two) times daily. To affected areas (legs) 60 g 6   No current facility-administered medications for this visit.    REVIEW OF SYSTEMS:   Constitutional: ( - ) fevers, ( - )  chills , ( - ) night sweats Eyes: ( - ) blurriness of vision, ( - ) double vision, ( - ) watery eyes Ears,  nose, mouth, throat, and face: ( - ) mucositis, ( - ) sore throat Respiratory: ( - ) cough, ( - ) dyspnea, ( - ) wheezes Cardiovascular: ( - ) palpitation, ( - ) chest discomfort, ( - ) lower extremity swelling Gastrointestinal:  ( - ) nausea, ( - ) heartburn, ( - ) change in bowel habits Skin: ( - ) abnormal skin rashes Lymphatics: ( - ) new lymphadenopathy, ( - ) easy bruising Neurological: ( - ) numbness, ( - ) tingling, ( - ) new weaknesses Behavioral/Psych: ( - ) mood change, ( - ) new changes  All other systems were reviewed with the patient and are negative.  PHYSICAL EXAMINATION:  Vitals:   10/18/24 1513  BP: 119/75  Pulse: 81  Resp: 15  Temp: (!) 97.2 F (36.2 C)  SpO2: 100%     Filed Weights   10/18/24 1513  Weight: 153 lb 12.8 oz (69.8 kg)      GENERAL: Well-appearing elderly Caucasian male, alert, no distress and comfortable SKIN: skin color, texture, turgor are normal, no rashes or significant lesions EYES: conjunctiva are pink and non-injected, sclera clear LUNGS: clear to auscultation and percussion with normal breathing effort HEART: regular rate & rhythm and no murmurs and no lower extremity edema Musculoskeletal: no cyanosis of digits and no clubbing  PSYCH: alert & oriented x 3, fluent speech NEURO: no focal motor/sensory deficits  LABORATORY DATA:  I have reviewed the data as listed    Latest Ref Rng & Units 10/18/2024    2:32 PM 05/18/2024    2:32 PM 03/23/2024    4:03 PM  CBC  WBC 4.0 - 10.5 K/uL 4.0  4.8  5.2   Hemoglobin 13.0 - 17.0 g/dL 87.3  86.9  88.0   Hematocrit 39.0 - 52.0 % 37.8  38.6  35.5  Platelets 150 - 400 K/uL 143  204  165        Latest Ref Rng & Units 10/18/2024    2:32 PM 05/18/2024    2:32 PM 03/23/2024    3:54 PM  CMP  Glucose 70 - 99 mg/dL 861  844  897   BUN 8 - 23 mg/dL 16  23  29    Creatinine 0.61 - 1.24 mg/dL 8.94  8.95  9.00   Sodium 135 - 145 mmol/L 142  141  136   Potassium 3.5 - 5.1 mmol/L 4.8  4.2  4.3    Chloride 98 - 111 mmol/L 105  106  104   CO2 22 - 32 mmol/L 27  28  23    Calcium 8.9 - 10.3 mg/dL 9.4  9.6  8.7   Total Protein 6.5 - 8.1 g/dL 6.4  6.7    Total Bilirubin 0.0 - 1.2 mg/dL 0.4  0.3    Alkaline Phos 38 - 126 U/L 88  75    AST 15 - 41 U/L 24  14    ALT 0 - 44 U/L 11  7     RADIOGRAPHIC STUDIES: No results found.  ASSESSMENT & PLAN Randy AHLERS Sr. 89 y.o. male with medical history significant for castration resistant adenocarcinoma of the prostate who presents for a follow up visit.  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 daily with Lupron  every 4 months. -- At last check testosterone  was undetectable.  Recheck today.  PSA 2.6 on 05/18/2024. Results pending today.  -- Other labs show white blood cell 4.0, Hgb 12.6, MCV 95.5, Plt 143.  Creatinine 1.05  -- Plan for patient return to clinic every 4 months for continued evaluation.  No orders of the defined types were placed in this encounter.   All questions were answered. The patient knows to call the clinic with any problems, questions or concerns.  A total of more than 30 minutes were spent on this encounter with face-to-face time and non-face-to-face time, including preparing to see the patient, ordering tests and/or medications, counseling the patient and coordination of care as outlined above.   Norleen IVAR Kidney, MD Department of Hematology/Oncology [facility] Phone: [phone] Pager: [phone] Email: [email or web address]  10/21/2024 5:52 PM

## 2024-10-18 NOTE — Patient Instructions (Signed)
 Leuprolide Solution for Injection What is this medication? LEUPROLIDE (loo PROE lide) reduces the symptoms of prostate cancer. It works by decreasing levels of the hormone testosterone in the body. This prevents prostate cancer cells from spreading or growing. This medicine may be used for other purposes; ask your health care provider or pharmacist if you have questions. COMMON BRAND NAME(S): Lupron What should I tell my care team before I take this medication? They need to know if you have any of these conditions: Diabetes Heart attack Heart disease High blood pressure High cholesterol Pain or difficulty passing urine Spinal cord metastasis Stroke Tobacco use An unusual or allergic reaction to leuprolide, other medications, foods, dyes, or preservatives Pregnant or trying to get pregnant Breast-feeding How should I use this medication? This medication is for injection under the skin or into a muscle. You will be taught how to prepare and give this medication. Use exactly as directed. Take your medication at regular intervals. Do not take it more often than directed. It is important that you put your used needles and syringes in a special sharps container. Do not put them in a trash can. If you do not have a sharps container, call your care team to get one. A special MedGuide will be given to you by the pharmacist with each prescription and refill. Be sure to read this information carefully each time. Talk to your care team about the use of this medication in children. While this medication may be prescribed for children as young as 8 years for selected conditions, precautions do apply. Overdosage: If you think you have taken too much of this medicine contact a poison control center or emergency room at once. NOTE: This medicine is only for you. Do not share this medicine with others. What if I miss a dose? If you miss a dose, take it as soon as you can. If it is almost time for your next  dose, take only that dose. Do not take double or extra doses. What may interact with this medication? Do not take this medication with any of the following: Chasteberry Cisapride Dronedarone Pimozide Thioridazine This medication may also interact with the following: Estrogen or progestin hormones Herbal or dietary supplements, like black cohosh or DHEA Other medications that cause heart rhythm changes Testosterone This list may not describe all possible interactions. Give your health care provider a list of all the medicines, herbs, non-prescription drugs, or dietary supplements you use. Also tell them if you smoke, drink alcohol, or use illegal drugs. Some items may interact with your medicine. What should I watch for while using this medication? Visit your care team for regular checks on your progress. During the first week, your symptoms may get worse, but then will improve as you continue your treatment. You may get hot flashes, increased bone pain, increased difficulty passing urine, or an aggravation of nerve symptoms. Discuss these effects with your care team, some of them may improve with continued use of this medication. Patients may experience a menstrual cycle or spotting during the first 2 months of therapy with this medication. If this continues, contact your care team. This medication may increase blood sugar. The risk may be higher in patients who already have diabetes. Ask your care team what you can do to lower your risk of diabetes while taking this medication. What side effects may I notice from receiving this medication? Side effects that you should report to your care team as soon as possible: Allergic reactions--skin rash,  itching, hives, swelling of the face, lips, tongue, or throat Heart attack--pain or tightness in the chest, shoulders, arms, or jaw, nausea, shortness of breath, cold or clammy skin, feeling faint or lightheaded Heart rhythm changes--fast or irregular  heartbeat, dizziness, feeling faint or lightheaded, chest pain, trouble breathing High blood sugar (hyperglycemia)--increased thirst or amount of urine, unusual weakness or fatigue, blurry vision New or worsening seizures Redness, blistering, peeling, or loosening of the skin, including inside the mouth Stroke--sudden numbness or weakness of the face, arm, or leg, trouble speaking, confusion, trouble walking, loss of balance or coordination, dizziness, severe headache, change in vision Swelling and pain of the tumor site or lymph nodes Side effects that usually do not require medical attention (report these to your care team if they continue or are bothersome): Change in sex drive or performance Hot flashes Joint pain Pain, redness, or irritation at injection site Swelling of the ankles, hands, or feet Unusual weakness or fatigue This list may not describe all possible side effects. Call your doctor for medical advice about side effects. You may report side effects to FDA at 1-800-FDA-1088. Where should I keep my medication? Keep out of the reach of children and pets. Store below 25 degrees C (77 degrees F). Do not freeze. Protect from light. Get rid of any unused medication after the expiration date. To get rid of medications that are no longer needed or have expired: Take the medication to a medication take-back program. Check with your pharmacy or law enforcement to find a location. If you cannot return the medication, ask your pharmacist or care team how to get rid of this medication safely. NOTE: This sheet is a summary. It may not cover all possible information. If you have questions about this medicine, talk to your doctor, pharmacist, or health care provider.  2024 Elsevier/Gold Standard (2023-10-07 00:00:00)

## 2024-10-19 LAB — TESTOSTERONE: Testosterone: 5 ng/dL — ABNORMAL LOW (ref 264–916)

## 2024-10-21 ENCOUNTER — Encounter: Payer: Self-pay | Admitting: Hematology and Oncology

## 2024-10-22 ENCOUNTER — Telehealth: Payer: Self-pay | Admitting: Hematology and Oncology

## 2024-10-22 NOTE — Telephone Encounter (Signed)
 Attempted to leave the patient a voicemail but was unable to do so. A reminder has been printed and mailed.

## 2024-10-26 ENCOUNTER — Other Ambulatory Visit (HOSPITAL_COMMUNITY): Payer: Self-pay

## 2024-10-26 ENCOUNTER — Encounter: Payer: Self-pay | Admitting: Hematology and Oncology

## 2024-11-06 ENCOUNTER — Telehealth: Payer: Self-pay

## 2024-11-06 ENCOUNTER — Other Ambulatory Visit (HOSPITAL_COMMUNITY): Payer: Self-pay

## 2024-11-06 NOTE — Telephone Encounter (Signed)
 Oral Oncology Patient Advocate Encounter  Prior Authorization for ERLEADA  60 MG tablet  has been approved.    PA# [id number] Effective dates: 11/08/24 through 11/07/25   Lucie Lamer, CPhT [place]  [facility] Oncology Pharmacy Patient Advocate Specialist [facility] [place] Phone: [phone]  Fax: [phone] [email or web address]

## 2024-11-06 NOTE — Telephone Encounter (Signed)
 Oral Oncology Patient Advocate Encounter   Received notification that prior authorization for ERLEADA  60 MG tablet  is due for renewal.   PA submitted on 11/06/24 Key [id number] Status is pending     Lucie Lamer, CPhT [employer]  [facility] Oncology Pharmacy Patient Advocate Specialist [facility] [place] Phone: [phone]  Fax: [phone] [email or web address]

## 2024-11-09 ENCOUNTER — Other Ambulatory Visit (HOSPITAL_COMMUNITY): Payer: Self-pay

## 2024-11-13 ENCOUNTER — Encounter: Payer: Self-pay | Admitting: Hematology and Oncology

## 2024-11-13 ENCOUNTER — Other Ambulatory Visit (HOSPITAL_COMMUNITY): Payer: Self-pay

## 2024-11-15 ENCOUNTER — Other Ambulatory Visit: Payer: Self-pay | Admitting: Cardiology

## 2024-11-19 ENCOUNTER — Encounter: Payer: Self-pay | Admitting: Hematology and Oncology

## 2024-11-19 ENCOUNTER — Other Ambulatory Visit (HOSPITAL_COMMUNITY): Payer: Self-pay

## 2024-11-19 ENCOUNTER — Other Ambulatory Visit: Payer: Self-pay | Admitting: *Deleted

## 2024-11-19 ENCOUNTER — Telehealth: Payer: Self-pay

## 2024-11-19 DIAGNOSIS — Z192 Hormone resistant malignancy status: Secondary | ICD-10-CM

## 2024-11-19 MED ORDER — ERLEADA 60 MG PO TABS: 240.0000 mg | ORAL_TABLET | Freq: Every day | ORAL | 1 refills | Status: AC

## 2024-11-19 NOTE — Telephone Encounter (Signed)
 Oral Oncology Patient Advocate Encounter  I spoke with patient about his Erleada  having to be filled at [facility], he has 2 full bottles and half of another bottle, so I am going to call him in March and get his first fill going with us  then.  Lucie Lamer, CPhT [place]  [facility] Oncology Pharmacy Patient Advocate Specialist [facility] [place] Phone: [phone]  Fax: [phone] [email or web address]

## 2024-11-29 ENCOUNTER — Telehealth: Payer: Self-pay

## 2024-11-29 ENCOUNTER — Other Ambulatory Visit (HOSPITAL_COMMUNITY): Payer: Self-pay

## 2024-11-29 NOTE — Telephone Encounter (Signed)
 Oral Oncology Patient Advocate Encounter  Was successful in securing patient a $6000 grant from [facility] to provide copayment coverage for Erleada .  This will keep the out of pocket expense at $0.     [employer] ID: [id number]   The billing information is as follows and has been shared with [facility].    RxBin: [id number] PCN: [id number] Member ID: [id number] Group ID: [id number] Dates of Eligibility: 10/14/24 through 10/13/25  Fund:  Prostate  Lucie Lamer, CPhT [place]  [facility] Oncology Pharmacy Patient Advocate Specialist [facility] [place] Phone: [phone]  Fax: [phone] [email or web address]

## 2024-12-13 ENCOUNTER — Other Ambulatory Visit: Payer: Self-pay | Admitting: Cardiology

## 2024-12-28 ENCOUNTER — Inpatient Hospital Stay: Admitting: Hematology and Oncology

## 2024-12-28 ENCOUNTER — Inpatient Hospital Stay

## 2025-01-11 ENCOUNTER — Inpatient Hospital Stay: Admitting: Hematology and Oncology

## 2025-01-11 ENCOUNTER — Inpatient Hospital Stay

## 2025-02-18 ENCOUNTER — Inpatient Hospital Stay

## 2025-02-18 ENCOUNTER — Inpatient Hospital Stay: Admitting: Hematology and Oncology

## 2025-06-20 ENCOUNTER — Inpatient Hospital Stay

## 2025-06-20 ENCOUNTER — Inpatient Hospital Stay: Admitting: Hematology and Oncology

## 2025-10-25 ENCOUNTER — Inpatient Hospital Stay

## 2025-10-25 ENCOUNTER — Inpatient Hospital Stay: Admitting: Hematology and Oncology
